# Patient Record
Sex: Female | Born: 1950
Health system: Southern US, Community
[De-identification: ages and names within clinical notes are randomized; demographics above are authoritative.]

## PROBLEM LIST (undated history)

## (undated) DIAGNOSIS — K635 Polyp of colon: Secondary | ICD-10-CM

## (undated) DIAGNOSIS — M1990 Unspecified osteoarthritis, unspecified site: Secondary | ICD-10-CM

## (undated) DIAGNOSIS — T7840XA Allergy, unspecified, initial encounter: Secondary | ICD-10-CM

## (undated) DIAGNOSIS — I10 Essential (primary) hypertension: Secondary | ICD-10-CM

## (undated) HISTORY — DX: Essential (primary) hypertension: I10

## (undated) HISTORY — DX: Unspecified osteoarthritis, unspecified site: M19.90

## (undated) HISTORY — PX: TONSILLECTOMY: SUR1361

## (undated) HISTORY — PX: COLONOSCOPY: SHX174

## (undated) HISTORY — DX: Polyp of colon: K63.5

## (undated) HISTORY — DX: Allergy, unspecified, initial encounter: T78.40XA

---

## 1968-07-19 HISTORY — PX: OTHER SURGICAL HISTORY: SHX169

## 1992-07-19 HISTORY — PX: OTHER SURGICAL HISTORY: SHX169

## 1998-07-17 ENCOUNTER — Other Ambulatory Visit: Admission: RE | Admit: 1998-07-17 | Discharge: 1998-07-17 | Payer: Self-pay | Admitting: Obstetrics and Gynecology

## 1999-02-02 ENCOUNTER — Encounter: Payer: Self-pay | Admitting: Emergency Medicine

## 1999-02-02 ENCOUNTER — Emergency Department (HOSPITAL_COMMUNITY): Admission: EM | Admit: 1999-02-02 | Discharge: 1999-02-02 | Payer: Self-pay | Admitting: Emergency Medicine

## 1999-03-13 ENCOUNTER — Encounter (INDEPENDENT_AMBULATORY_CARE_PROVIDER_SITE_OTHER): Payer: Self-pay | Admitting: Specialist

## 1999-03-13 ENCOUNTER — Other Ambulatory Visit: Admission: RE | Admit: 1999-03-13 | Discharge: 1999-03-13 | Payer: Self-pay | Admitting: Gastroenterology

## 1999-03-26 ENCOUNTER — Other Ambulatory Visit: Admission: RE | Admit: 1999-03-26 | Discharge: 1999-03-26 | Payer: Self-pay | Admitting: Plastic Surgery

## 1999-06-29 ENCOUNTER — Other Ambulatory Visit: Admission: RE | Admit: 1999-06-29 | Discharge: 1999-06-29 | Payer: Self-pay | Admitting: Obstetrics and Gynecology

## 1999-09-15 ENCOUNTER — Encounter: Admission: RE | Admit: 1999-09-15 | Discharge: 1999-09-15 | Payer: Self-pay | Admitting: Internal Medicine

## 1999-09-15 ENCOUNTER — Encounter: Payer: Self-pay | Admitting: Internal Medicine

## 2000-08-09 ENCOUNTER — Other Ambulatory Visit: Admission: RE | Admit: 2000-08-09 | Discharge: 2000-08-09 | Payer: Self-pay | Admitting: Obstetrics and Gynecology

## 2000-10-20 ENCOUNTER — Encounter: Payer: Self-pay | Admitting: Obstetrics and Gynecology

## 2000-10-20 ENCOUNTER — Encounter: Admission: RE | Admit: 2000-10-20 | Discharge: 2000-10-20 | Payer: Self-pay | Admitting: Obstetrics and Gynecology

## 2001-10-13 ENCOUNTER — Encounter: Admission: RE | Admit: 2001-10-13 | Discharge: 2001-10-13 | Payer: Self-pay | Admitting: Internal Medicine

## 2001-10-13 ENCOUNTER — Encounter: Payer: Self-pay | Admitting: Internal Medicine

## 2001-10-24 ENCOUNTER — Encounter: Payer: Self-pay | Admitting: Obstetrics and Gynecology

## 2001-10-24 ENCOUNTER — Encounter: Admission: RE | Admit: 2001-10-24 | Discharge: 2001-10-24 | Payer: Self-pay | Admitting: Obstetrics and Gynecology

## 2001-11-02 ENCOUNTER — Encounter: Admission: RE | Admit: 2001-11-02 | Discharge: 2001-11-02 | Payer: Self-pay | Admitting: Internal Medicine

## 2001-11-02 ENCOUNTER — Encounter: Payer: Self-pay | Admitting: Internal Medicine

## 2002-01-11 ENCOUNTER — Other Ambulatory Visit: Admission: RE | Admit: 2002-01-11 | Discharge: 2002-01-11 | Payer: Self-pay | Admitting: Obstetrics and Gynecology

## 2003-01-18 ENCOUNTER — Other Ambulatory Visit: Admission: RE | Admit: 2003-01-18 | Discharge: 2003-01-18 | Payer: Self-pay | Admitting: Obstetrics and Gynecology

## 2003-01-24 ENCOUNTER — Encounter: Admission: RE | Admit: 2003-01-24 | Discharge: 2003-01-24 | Payer: Self-pay | Admitting: Obstetrics and Gynecology

## 2003-01-24 ENCOUNTER — Encounter: Payer: Self-pay | Admitting: Obstetrics and Gynecology

## 2004-01-30 ENCOUNTER — Encounter: Admission: RE | Admit: 2004-01-30 | Discharge: 2004-01-30 | Payer: Self-pay | Admitting: Internal Medicine

## 2004-02-18 ENCOUNTER — Other Ambulatory Visit: Admission: RE | Admit: 2004-02-18 | Discharge: 2004-02-18 | Payer: Self-pay | Admitting: Obstetrics and Gynecology

## 2004-05-22 ENCOUNTER — Ambulatory Visit: Payer: Self-pay | Admitting: Internal Medicine

## 2005-02-10 ENCOUNTER — Ambulatory Visit: Payer: Self-pay | Admitting: Internal Medicine

## 2005-02-12 ENCOUNTER — Encounter: Admission: RE | Admit: 2005-02-12 | Discharge: 2005-02-12 | Payer: Self-pay | Admitting: Internal Medicine

## 2005-02-19 ENCOUNTER — Other Ambulatory Visit: Admission: RE | Admit: 2005-02-19 | Discharge: 2005-02-19 | Payer: Self-pay | Admitting: Obstetrics and Gynecology

## 2005-03-01 ENCOUNTER — Encounter: Admission: RE | Admit: 2005-03-01 | Discharge: 2005-03-01 | Payer: Self-pay | Admitting: Internal Medicine

## 2005-03-23 ENCOUNTER — Ambulatory Visit: Payer: Self-pay | Admitting: Internal Medicine

## 2005-09-17 ENCOUNTER — Ambulatory Visit: Payer: Self-pay | Admitting: Internal Medicine

## 2006-01-28 ENCOUNTER — Ambulatory Visit: Payer: Self-pay | Admitting: Gastroenterology

## 2006-02-09 ENCOUNTER — Ambulatory Visit: Payer: Self-pay | Admitting: Gastroenterology

## 2006-02-15 ENCOUNTER — Encounter: Admission: RE | Admit: 2006-02-15 | Discharge: 2006-02-15 | Payer: Self-pay | Admitting: Internal Medicine

## 2007-02-17 ENCOUNTER — Encounter: Admission: RE | Admit: 2007-02-17 | Discharge: 2007-02-17 | Payer: Self-pay | Admitting: Internal Medicine

## 2007-06-12 ENCOUNTER — Telehealth: Payer: Self-pay | Admitting: Internal Medicine

## 2007-06-13 ENCOUNTER — Telehealth: Payer: Self-pay | Admitting: Internal Medicine

## 2008-02-19 ENCOUNTER — Encounter: Admission: RE | Admit: 2008-02-19 | Discharge: 2008-02-19 | Payer: Self-pay | Admitting: Internal Medicine

## 2009-02-20 ENCOUNTER — Encounter: Admission: RE | Admit: 2009-02-20 | Discharge: 2009-02-20 | Payer: Self-pay | Admitting: Internal Medicine

## 2009-07-19 LAB — HM PAP SMEAR: HM Pap smear: 2011

## 2010-01-01 ENCOUNTER — Telehealth: Payer: Self-pay | Admitting: Internal Medicine

## 2010-02-23 ENCOUNTER — Encounter: Admission: RE | Admit: 2010-02-23 | Discharge: 2010-02-23 | Payer: Self-pay | Admitting: Internal Medicine

## 2010-03-04 ENCOUNTER — Encounter: Admission: RE | Admit: 2010-03-04 | Discharge: 2010-03-04 | Payer: Self-pay | Admitting: Obstetrics & Gynecology

## 2010-08-18 NOTE — Progress Notes (Signed)
Summary: not seen since 2006  Phone Note Call from Patient Call back at [phone]   Caller: Patient Call For: Jassica Zazueta  Summary of Call: pt has not seen doc Atzin Buchta since 03-23-2005 she is requesting to come in for physical, pt is aware doc Tiye Huwe is not accepting new pt. Can I sch her to have a cpx? Initial call taken by: Heron Sabins,  January 01, 2010 9:02 AM  Follow-up for Phone Call        ok-per dr Yvonne Kendall have cpx Follow-up by: Willy Eddy, LPN,  January 01, 2010 9:23 AM  Additional Follow-up for Phone Call Additional follow up Details #1::        pt will cb Additional Follow-up by: Heron Sabins,  January 01, 2010 11:44 AM

## 2011-01-29 ENCOUNTER — Other Ambulatory Visit: Payer: Self-pay | Admitting: Internal Medicine

## 2011-01-29 DIAGNOSIS — Z1231 Encounter for screening mammogram for malignant neoplasm of breast: Secondary | ICD-10-CM

## 2011-02-25 ENCOUNTER — Ambulatory Visit: Payer: Self-pay | Admitting: Internal Medicine

## 2011-03-02 ENCOUNTER — Ambulatory Visit
Admission: RE | Admit: 2011-03-02 | Discharge: 2011-03-02 | Disposition: A | Discharge status: Home / Self Care | Payer: BC Managed Care – PPO | Source: Ambulatory Visit | Attending: Internal Medicine | Admitting: Internal Medicine

## 2011-03-02 DIAGNOSIS — Z1231 Encounter for screening mammogram for malignant neoplasm of breast: Secondary | ICD-10-CM

## 2011-05-31 ENCOUNTER — Ambulatory Visit: Payer: Self-pay | Admitting: Internal Medicine

## 2011-08-02 ENCOUNTER — Ambulatory Visit: Payer: Self-pay | Admitting: Internal Medicine

## 2011-10-18 ENCOUNTER — Ambulatory Visit: Payer: Self-pay | Admitting: Internal Medicine

## 2012-01-03 ENCOUNTER — Ambulatory Visit: Payer: Self-pay | Admitting: Internal Medicine

## 2012-01-04 ENCOUNTER — Encounter: Payer: Self-pay | Admitting: Internal Medicine

## 2012-01-04 ENCOUNTER — Ambulatory Visit (INDEPENDENT_AMBULATORY_CARE_PROVIDER_SITE_OTHER): Payer: BC Managed Care – PPO | Admitting: Internal Medicine

## 2012-01-04 ENCOUNTER — Other Ambulatory Visit (INDEPENDENT_AMBULATORY_CARE_PROVIDER_SITE_OTHER): Payer: BC Managed Care – PPO | Admitting: *Deleted

## 2012-01-04 VITALS — BP 150/90 | HR 72 | Temp 98.2°F | Resp 16 | Ht 67.0 in | Wt 210.0 lb

## 2012-01-04 DIAGNOSIS — Z0000 Encounter for general adult medical examination without abnormal findings: Secondary | ICD-10-CM

## 2012-01-04 DIAGNOSIS — Z23 Encounter for immunization: Secondary | ICD-10-CM

## 2012-01-04 DIAGNOSIS — R339 Retention of urine, unspecified: Secondary | ICD-10-CM

## 2012-01-04 DIAGNOSIS — R002 Palpitations: Secondary | ICD-10-CM

## 2012-01-04 DIAGNOSIS — I10 Essential (primary) hypertension: Secondary | ICD-10-CM

## 2012-01-04 LAB — TSH: TSH: 2.18 u[IU]/mL (ref 0.35–5.50)

## 2012-01-04 LAB — BASIC METABOLIC PANEL
BUN: 10 mg/dL (ref 6–23)
Creatinine, Ser: 0.8 mg/dL (ref 0.4–1.2)
Glucose, Bld: 91 mg/dL (ref 70–99)
Sodium: 142 mEq/L (ref 135–145)

## 2012-01-04 LAB — POCT URINALYSIS DIPSTICK
Bilirubin, UA: NEGATIVE
Blood, UA: NEGATIVE
Ketones, UA: NEGATIVE
Nitrite, UA: NEGATIVE
Spec Grav, UA: <=1.005

## 2012-01-04 LAB — LIPID PANEL
Cholesterol: 225 mg/dL — ABNORMAL HIGH (ref 0–200)
HDL: 66.7 mg/dL (ref >39.00)
VLDL: 9 mg/dL (ref 0.0–40.0)

## 2012-01-04 LAB — CBC WITH DIFFERENTIAL/PLATELET
Eosinophils Relative: 1.9 % (ref 0.0–5.0)
HCT: 43 % (ref 36.0–46.0)
Lymphs Abs: 2.1 10*3/uL (ref 0.7–4.0)
Monocytes Absolute: 0.5 10*3/uL (ref 0.1–1.0)
Platelets: 261 10*3/uL (ref 150.0–400.0)
RBC: 4.68 Mil/uL (ref 3.87–5.11)
WBC: 6.7 10*3/uL (ref 4.5–10.5)

## 2012-01-04 LAB — HEPATIC FUNCTION PANEL
ALT: 17 U/L (ref 0–35)
AST: 19 U/L (ref 0–37)
Albumin: 4.1 g/dL (ref 3.5–5.2)
Alkaline Phosphatase: 73 U/L (ref 39–117)
Total Bilirubin: 0.7 mg/dL (ref 0.3–1.2)
Total Protein: 7.4 g/dL (ref 6.0–8.3)

## 2012-01-04 LAB — LDL CHOLESTEROL, DIRECT: Direct LDL: 154.1 mg/dL

## 2012-01-04 MED ORDER — TELMISARTAN 40 MG PO TABS: 40.0000 mg | ORAL_TABLET | Freq: Every day | ORAL | Status: DC

## 2012-01-04 NOTE — Patient Instructions (Addendum)
Monitor your blood pressure one supply so we can keep the feedings bring them back with you to your physical examination   "practical paleo"

## 2012-01-04 NOTE — Progress Notes (Signed)
Subjective:    Patient ID: Melinda Ferguson, female    DOB: 1951/06/21, 61 y.o.   MRN: [id number]  HPI  Patient is a 61 year old female presents to reestablish. She has a history of allergic rhinitis and osteo-arthritis.  We reviewed her health maintenance record and have determined that she had colonoscopy in 2007 and will be due a colonoscopy in 2016.  She is up-to-date for other health maintenance with the exception of a zoster vaccine and a tetanus vaccination which we will accomplished today.  The tetanus she should be a DPT to include pertussis. We are working together today to establish some help goals and it is apparent that her blood pressure has been elevated for at least one year and is in a treatment range.    Review of Systems  Constitutional: Negative for activity change, appetite change and fatigue.  HENT: Negative for ear pain, congestion, neck pain, postnasal drip and sinus pressure.   Eyes: Negative for redness and visual disturbance.  Respiratory: Negative for cough, shortness of breath and wheezing.   Gastrointestinal: Negative for abdominal pain and abdominal distention.  Genitourinary: Negative for dysuria, frequency and menstrual problem.  Musculoskeletal: Negative for myalgias, joint swelling and arthralgias.  Skin: Negative for rash and wound.  Neurological: Negative for dizziness, weakness and headaches.  Hematological: Negative for adenopathy. Does not bruise/bleed easily.  Psychiatric/Behavioral: Negative for disturbed wake/sleep cycle and decreased concentration.   Past Medical History  Diagnosis Date  . Allergy   . Arthritis   . Colon polyps     History   Social History  . Marital Status: Married    Spouse Name: N/A    Number of Children: N/A  . Years of Education: N/A   Occupational History  . language facilitator    Social History Main Topics  . Smoking status: Former Smoker    Types: Cigarettes    Quit date: 01/04/1979  . Smokeless  tobacco: Not on file  . Alcohol Use: No  . Drug Use: Not on file  . Sexually Active: Yes   Other Topics Concern  . Not on file   Social History Narrative  . No narrative on file    Past Surgical History  Procedure Date  . Wisdom teeth extraction 1970  . Orthoscopy 1994  . Tonsillectomy     Family History  Problem Relation Age of Onset  . Colon cancer    . Heart disease Mother   . Heart disease Father   . Cancer Maternal Grandfather 26    colon cancer    No Known Allergies  Current Outpatient Prescriptions on File Prior to Visit  Medication Sig Dispense Refill  . fexofenadine (ALLEGRA) 60 MG tablet Take 60 mg by mouth daily.      Marland Kitchen olmesartan (BENICAR) 20 MG tablet Take 20 mg by mouth daily.        BP 150/90  Pulse 72  Temp 98.2 F (36.8 C)  Resp 16  Ht 5\' 7"  (1.702 m)  Wt 210 lb (95.255 kg)  BMI 32.89 kg/m2       Objective:   Physical Exam  Vitals reviewed. Constitutional: She appears well-nourished.  HENT:  Head: Normocephalic and atraumatic.  Eyes: Conjunctivae normal are normal. Pupils are equal, round, and reactive to light.  Cardiovascular: Normal rate and regular rhythm.   Murmur heard. Pulmonary/Chest: No respiratory distress. She has no wheezes.  Abdominal: Soft.  Skin: She is diaphoretic.          Assessment &  Plan:   stage II hypertension.  Her risk factors for hypertension include heart disease in her father, and her father side of the family.  She also has a BMI of 32 which is an additional risk factor for elevated blood pressure.  In the use of nonsteroidal anti-inflammatories. I believe she has true onset essential hypertension.  We will obtain screening lab work today for a physical which will include a basic metabolic panel thyroid panel and a CBC with differential and she will begin Benicar 20 mg by mouth daily which should be tolerated without potential side effects.  When she comes back for her complete physical  examination and for 6 weeks we will evaluate blood pressure control but we will also ask her to monitor her blood pressure at a pharmacy at least once or twice a week in the interim time

## 2012-01-17 ENCOUNTER — Telehealth: Payer: Self-pay | Admitting: Internal Medicine

## 2012-01-17 NOTE — Telephone Encounter (Signed)
Pt called and needs clarification on instructions for Benicar. Pls call.

## 2012-01-17 NOTE — Telephone Encounter (Signed)
Answered questions.  

## 2012-01-27 ENCOUNTER — Other Ambulatory Visit: Payer: Self-pay | Admitting: Internal Medicine

## 2012-01-27 DIAGNOSIS — Z1231 Encounter for screening mammogram for malignant neoplasm of breast: Secondary | ICD-10-CM

## 2012-02-07 ENCOUNTER — Ambulatory Visit (INDEPENDENT_AMBULATORY_CARE_PROVIDER_SITE_OTHER): Payer: BC Managed Care – PPO | Admitting: Internal Medicine

## 2012-02-07 ENCOUNTER — Other Ambulatory Visit: Payer: Self-pay | Admitting: Dermatology

## 2012-02-07 ENCOUNTER — Encounter: Payer: Self-pay | Admitting: Internal Medicine

## 2012-02-07 VITALS — BP 140/80 | HR 68 | Temp 98.2°F | Resp 16 | Ht 67.0 in | Wt 208.0 lb

## 2012-02-07 DIAGNOSIS — Z0000 Encounter for general adult medical examination without abnormal findings: Secondary | ICD-10-CM

## 2012-02-07 DIAGNOSIS — I10 Essential (primary) hypertension: Secondary | ICD-10-CM

## 2012-02-07 NOTE — Patient Instructions (Signed)
   Goals for the next visit is to have 10 pounds off within come back in 2 months for close followup because of how important this is to your overall health You have a goal to get off blood pressure medicines I have a goal to help you.  1 consider a gluten-free diet 2. Take omega-3 supplements fish oil 1000 mg at least twice daily 3. Walking or some form of aerobic exercise 30-40 minutes 3 times a week

## 2012-02-07 NOTE — Progress Notes (Signed)
Subjective:    Patient ID: Melinda Ferguson, female    DOB: [birth year], 61 y.o.   MRN: [id number]  HPI cpx Patient is followed for hypertension well controlled on Benicar she has seasonal allergies for which he takes Allegra and takes ibuprofen on intermittent basis. Laboratory values drawn prior to her physical as well as her vital signs were discussed in detail with the patient today   Review of Systems  Constitutional: Negative for activity change, appetite change and fatigue.  HENT: Negative for ear pain, congestion, neck pain, postnasal drip and sinus pressure.   Eyes: Negative for redness and visual disturbance.  Respiratory: Negative for cough, shortness of breath and wheezing.   Gastrointestinal: Negative for abdominal pain and abdominal distention.  Genitourinary: Negative for dysuria, frequency and menstrual problem.  Musculoskeletal: Negative for myalgias, joint swelling and arthralgias.  Skin: Negative for rash and wound.  Neurological: Negative for dizziness, weakness and headaches.  Hematological: Negative for adenopathy. Does not bruise/bleed easily.  Psychiatric/Behavioral: Negative for disturbed wake/sleep cycle and decreased concentration.    The patient is instructed to continue all medications as prescribed. Schedule followup with check out clerk upon leaving the clinic Past Medical History  Diagnosis Date  . Allergy   . Arthritis   . Colon polyps     History   Social History  . Marital Status: Married    Spouse Name: N/A    Number of Children: N/A  . Years of Education: N/A   Occupational History  . language facilitator    Social History Main Topics  . Smoking status: Former Smoker    Types: Cigarettes    Quit date: 01/04/1979  . Smokeless tobacco: Not on file  . Alcohol Use: No  . Drug Use: Not on file  . Sexually Active: Yes   Other Topics Concern  . Not on file   Social History Narrative  . No narrative on file    Past Surgical  History  Procedure Date  . Wisdom teeth extraction 1970  . Orthoscopy 1994  . Tonsillectomy     Family History  Problem Relation Age of Onset  . Colon cancer    . Heart disease Mother   . Heart disease Father   . Cancer Maternal Grandfather 26    colon cancer    No Known Allergies  Current Outpatient Prescriptions on File Prior to Visit  Medication Sig Dispense Refill  . olmesartan (BENICAR) 20 MG tablet Take 20 mg by mouth daily.      . fexofenadine (ALLEGRA) 60 MG tablet Take 60 mg by mouth daily.      Marland Kitchen ibuprofen (ADVIL,MOTRIN) 200 MG tablet Take 200 mg by mouth every 6 (six) hours as needed.        BP 140/80  Pulse 68  Temp 98.2 F (36.8 C)  Resp 16  Ht 5\' 7"  (1.702 m)  Wt 208 lb (94.348 kg)  BMI 32.58 kg/m2       Objective:   Physical Exam  Vitals reviewed. Constitutional: She is oriented to person, place, and time. She appears well-developed and well-nourished. No distress.  HENT:  Head: Normocephalic and atraumatic.  Right Ear: External ear normal.  Left Ear: External ear normal.  Nose: Nose normal.  Mouth/Throat: Oropharynx is clear and moist.  Eyes: Conjunctivae and EOM are normal. Pupils are equal, round, and reactive to light.  Neck: Normal range of motion. Neck supple. No JVD present. No tracheal deviation present. No thyromegaly present.  Cardiovascular: Normal rate,  regular rhythm, normal heart sounds and intact distal pulses.   No murmur heard. Pulmonary/Chest: Effort normal and breath sounds normal. She has no wheezes. She exhibits no tenderness.  Abdominal: Soft. Bowel sounds are normal.  Musculoskeletal: Normal range of motion. She exhibits no edema and no tenderness.  Lymphadenopathy:    She has no cervical adenopathy.  Neurological: She is alert and oriented to person, place, and time. She has normal reflexes. No cranial nerve deficit.  Skin: Skin is warm and dry. She is not diaphoretic.  Psychiatric: She has a normal mood and affect. Her  behavior is normal.          Assessment & Plan:   Patient presents for yearly preventative medicine examination.   all immunizations and health maintenance protocols were reviewed with the patient and they are up to date with these protocols.   screening laboratory values were reviewed with the patient including screening of hyperlipidemia PSA renal function and hepatic function.   There medications past medical history social history problem list and allergies were reviewed in detail.   Goals were established with regard to weight loss exercise diet in compliance with medications

## 2012-03-02 ENCOUNTER — Ambulatory Visit: Payer: BC Managed Care – PPO

## 2012-03-02 ENCOUNTER — Ambulatory Visit
Admission: RE | Admit: 2012-03-02 | Discharge: 2012-03-02 | Disposition: A | Discharge status: Home / Self Care | Payer: BC Managed Care – PPO | Source: Ambulatory Visit | Attending: Internal Medicine | Admitting: Internal Medicine

## 2012-03-02 DIAGNOSIS — Z1231 Encounter for screening mammogram for malignant neoplasm of breast: Secondary | ICD-10-CM

## 2012-04-07 ENCOUNTER — Telehealth: Payer: Self-pay | Admitting: Internal Medicine

## 2012-04-07 DIAGNOSIS — M25569 Pain in unspecified knee: Secondary | ICD-10-CM

## 2012-04-07 NOTE — Telephone Encounter (Signed)
Patient called stating that she has recurring knee pain and would like to be referred to an ortho MD. Please advise/assist.

## 2012-04-07 NOTE — Telephone Encounter (Signed)
Ok per Dr Jenkins, referral order placed 

## 2012-04-10 ENCOUNTER — Ambulatory Visit: Payer: BC Managed Care – PPO | Admitting: Internal Medicine

## 2012-04-18 ENCOUNTER — Telehealth: Payer: Self-pay | Admitting: Internal Medicine

## 2012-04-18 NOTE — Telephone Encounter (Signed)
Caller: Dorothee/Patient; Patient Name: Melinda Ferguson; PCP: Darryll Capers (Adults only); [facility] Phone Number: [phone].  Patient calling about benicar Rx.  States was to see Dr. Lovell Sheehan in September 2013 and expected to get Rx, but was asked to reschedule appointment for him, and rescheduled appointment in November 2013.  States will run out of benicar this week, and would like samples to get through till appointment in November.  Per Epic, taking benicar 20mg  daily; info to office for staff/provider review/samples of Rx.  May reach patient at [phone].

## 2012-04-19 ENCOUNTER — Other Ambulatory Visit: Payer: Self-pay | Admitting: *Deleted

## 2012-04-19 MED ORDER — OLMESARTAN MEDOXOMIL 20 MG PO TABS: 20.0000 mg | ORAL_TABLET | Freq: Every day | ORAL | Status: DC

## 2012-04-19 NOTE — Telephone Encounter (Signed)
Pt informed none avaiable- reuest call into [facility]

## 2012-05-01 ENCOUNTER — Telehealth: Payer: Self-pay | Admitting: Internal Medicine

## 2012-05-01 MED ORDER — LOSARTAN POTASSIUM 50 MG PO TABS: 50.0000 mg | ORAL_TABLET | Freq: Every day | ORAL | Status: DC

## 2012-05-01 NOTE — Telephone Encounter (Signed)
Caller: Melinda Ferguson/Patient; Patient Name: Melinda Ferguson; PCP: Darryll Capers (Adults only); [facility] Phone Number: [phone] Started on Benicar over the summer(took samples) and had first RX filled one week ago. It is Teir 3 drug and expensive and [employer] did not readily approve it.  She is wondering if she can be put on different medication that is cheaper. She remembers he was going to start her on something different- did not have enough samples for her try. She would like a call back so she can let her [employer] know. Medication Question Protocol.

## 2012-05-01 NOTE — Telephone Encounter (Signed)
Melinda Ferguson, I read the note below. I did the prior auth on her BEnicar, and it was approved 04/24/12. Not sure if there is still an issue, or if it is still too expensive, even with the PA. Thanks.

## 2012-05-01 NOTE — Telephone Encounter (Signed)
Per dr Lovell Sheehan- change to losartan 50 mg and pt informed and med sent in

## 2012-05-22 ENCOUNTER — Ambulatory Visit: Payer: BC Managed Care – PPO | Admitting: Internal Medicine

## 2012-05-23 ENCOUNTER — Ambulatory Visit (INDEPENDENT_AMBULATORY_CARE_PROVIDER_SITE_OTHER): Payer: BC Managed Care – PPO | Admitting: Family

## 2012-05-23 ENCOUNTER — Encounter: Payer: Self-pay | Admitting: Family

## 2012-05-23 VITALS — BP 134/88 | HR 98 | Temp 98.0°F | Wt 205.0 lb

## 2012-05-23 DIAGNOSIS — J309 Allergic rhinitis, unspecified: Secondary | ICD-10-CM

## 2012-05-23 DIAGNOSIS — R0982 Postnasal drip: Secondary | ICD-10-CM

## 2012-05-23 DIAGNOSIS — R05 Cough: Secondary | ICD-10-CM

## 2012-05-23 DIAGNOSIS — R059 Cough, unspecified: Secondary | ICD-10-CM

## 2012-05-23 MED ORDER — FLUTICASONE PROPIONATE 50 MCG/ACT NA SUSP: 2.0000 | Freq: Every day | NASAL | Status: DC

## 2012-05-23 NOTE — Progress Notes (Signed)
Subjective:    Patient ID: Melinda Ferguson, female    DOB: 10/02/1950, 61 y.o.   MRN: [id number]  HPI 61 year old white female, nonsmoker, [name] is in today with complaints of cough x3 weeks, nasal congestion , and sore throat. [name] been taken Allegra 60 mg once daily. Denies any fever, muscle aches or pain. Has a history of allergic rhinitis and typically has the symptoms around the time of the year.   Review of Systems  Constitutional: Negative.   HENT: Positive for congestion, sore throat and postnasal drip. Negative for sinus pressure.   Respiratory: Positive for cough. Negative for shortness of breath and wheezing.   Cardiovascular: Negative.   Gastrointestinal: Negative.   Musculoskeletal: Negative.   Skin: Negative.   Neurological: Negative.   Hematological: Negative.   Psychiatric/Behavioral: Negative.    Past Medical History  Diagnosis Date  . Allergy   . Arthritis   . Colon polyps     History   Social History  . Marital Status: Married    Spouse Name: N/A    Number of Children: N/A  . Years of Education: N/A   Occupational History  . language facilitator    Social History Main Topics  . Smoking status: Former Smoker    Types: Cigarettes    Quit date: 01/04/1979  . Smokeless tobacco: Not on file  . Alcohol Use: No  . Drug Use: Not on file  . Sexually Active: Yes   Other Topics Concern  . Not on file   Social History Narrative  . No narrative on file    Past Surgical History  Procedure Date  . Wisdom teeth extraction 1970  . Orthoscopy 1994  . Tonsillectomy     Family History  Problem Relation Age of Onset  . Colon cancer    . Heart disease Mother   . Heart disease Father   . Cancer Maternal Grandfather 26    colon cancer    No Known Allergies  Current Outpatient Prescriptions on File Prior to Visit  Medication Sig Dispense Refill  . fexofenadine (ALLEGRA) 60 MG tablet Take 60 mg by mouth daily.      Marland Kitchen ibuprofen  (ADVIL,MOTRIN) 200 MG tablet Take 200 mg by mouth every 6 (six) hours as needed.      . fluticasone (FLONASE) 50 MCG/ACT nasal spray Place 2 sprays into the nose daily.  16 g  6  . losartan (COZAAR) 50 MG tablet Take 1 tablet (50 mg total) by mouth daily.  90 tablet  3    BP 134/88  Pulse 98  Temp 98 F (36.7 C) (Oral)  Wt 205 lb (92.987 kg)  SpO2 98%chart    Objective:   Physical Exam  Constitutional: She is oriented to person, place, and time. She appears well-developed and well-nourished.  HENT:  Right Ear: External ear normal.  Left Ear: External ear normal.  Nose: Nose normal.  Mouth/Throat: Oropharynx is clear and moist.  Eyes: Conjunctivae normal are normal. Pupils are equal, round, and reactive to light.  Neck: Normal range of motion. Neck supple.  Cardiovascular: Normal rate, regular rhythm and normal heart sounds.   Pulmonary/Chest: Effort normal and breath sounds normal.  Abdominal: Soft. Bowel sounds are normal.  Musculoskeletal: Normal range of motion.  Neurological: She is alert and oriented to person, place, and time.  Skin: Skin is warm and dry.  Psychiatric: She has a normal mood and affect.  Assessment & Plan:  Assessment: Allergic rhinitis, postnasal drip, cough  Plan: Continue Allegra. Add Flonase 2 sprays in each nostril once daily. Patient to call the office if symptoms worsen or persist. Recheck as scheduled, and when necessary. Consider Medrol Dosepak if her symptoms persist.

## 2012-05-23 NOTE — Patient Instructions (Addendum)
Allergic Rhinitis  Allergic rhinitis is when the mucous membranes in the nose respond to allergens. Allergens are particles in the air that cause your body to have an allergic reaction. This causes you to release allergic antibodies. Through a chain of events, these eventually cause you to release histamine into the blood stream (hence the use of antihistamines). Although meant to be protective to the body, it is this release that causes your discomfort, such as frequent sneezing, congestion and an itchy runny nose.    CAUSES    The pollen allergens may come from grasses, trees, and weeds. This is seasonal allergic rhinitis, or "hay fever." Other allergens cause year-round allergic rhinitis (perennial allergic rhinitis) such as house dust mite allergen, pet dander and mold spores.    SYMPTOMS     Nasal stuffiness (congestion).   Runny, itchy nose with sneezing and tearing of the eyes.   There is often an itching of the mouth, eyes and ears.  It cannot be cured, but it can be controlled with medications.  DIAGNOSIS    If you are unable to determine the offending allergen, skin or blood testing may find it.  TREATMENT     Avoid the allergen.   Medications and allergy shots (immunotherapy) can help.   Hay fever may often be treated with antihistamines in pill or nasal spray forms. Antihistamines block the effects of histamine. There are over-the-counter medicines that may help with nasal congestion and swelling around the eyes. Check with your caregiver before taking or giving this medicine.  If the treatment above does not work, there are many new medications your caregiver can prescribe. Stronger medications may be used if initial measures are ineffective. Desensitizing injections can be used if medications and avoidance fails. Desensitization is when a patient is given ongoing shots until the body becomes less sensitive to the allergen. Make sure you follow up with your caregiver if problems continue.   SEEK MEDICAL CARE IF:     You develop fever (more than 100.5 F (38.1 C).   You develop a cough that does not stop easily (persistent).   You have shortness of breath.   You start wheezing.   Symptoms interfere with normal daily activities.  Document Released: 03/30/2001 Document Revised: 09/27/2011 Document Reviewed: 10/09/2008  [employer] Patient Information 2013 [employer].

## 2012-06-19 ENCOUNTER — Ambulatory Visit (INDEPENDENT_AMBULATORY_CARE_PROVIDER_SITE_OTHER): Payer: BC Managed Care – PPO | Admitting: Family

## 2012-06-19 ENCOUNTER — Encounter: Payer: Self-pay | Admitting: Family

## 2012-06-19 VITALS — BP 142/88 | HR 103 | Temp 98.7°F | Wt 199.0 lb

## 2012-06-19 DIAGNOSIS — R05 Cough: Secondary | ICD-10-CM

## 2012-06-19 DIAGNOSIS — J069 Acute upper respiratory infection, unspecified: Secondary | ICD-10-CM

## 2012-06-19 MED ORDER — METHYLPREDNISOLONE 4 MG PO KIT: PACK | ORAL | Status: AC

## 2012-06-19 NOTE — Patient Instructions (Addendum)

## 2012-06-19 NOTE — Progress Notes (Signed)
  Subjective:    Patient ID: Melinda Ferguson, female    DOB: [birth year], 61 y.o.   MRN: [id number]  HPI 61 year old white female, nonsmoker, [name] is in today with complaints of sneezing, cough, congestion, achiness and fatigue times one day. She has began to take Sudafed and Flonase and feels better today. Denies any nausea, vomiting, or headache.   Review of Systems  Constitutional: Positive for fatigue. Negative for fever.  HENT: Positive for congestion, sneezing and sinus pressure.   Eyes: Negative.   Respiratory: Negative.   Cardiovascular: Negative.   Gastrointestinal: Negative.   Skin: Negative.   Neurological: Negative.   Hematological: Negative.   Psychiatric/Behavioral: Negative.    Past Medical History  Diagnosis Date  . Allergy   . Arthritis   . Colon polyps     History   Social History  . Marital Status: Married    Spouse Name: N/A    Number of Children: N/A  . Years of Education: N/A   Occupational History  . language facilitator    Social History Main Topics  . Smoking status: Former Smoker    Types: Cigarettes    Quit date: 01/04/1979  . Smokeless tobacco: Not on file  . Alcohol Use: No  . Drug Use: Not on file  . Sexually Active: Yes   Other Topics Concern  . Not on file   Social History Narrative  . No narrative on file    Past Surgical History  Procedure Date  . Wisdom teeth extraction 1970  . Orthoscopy 1994  . Tonsillectomy     Family History  Problem Relation Age of Onset  . Colon cancer    . Heart disease Mother   . Heart disease Father   . Cancer Maternal Grandfather 26    colon cancer    No Known Allergies  Current Outpatient Prescriptions on File Prior to Visit  Medication Sig Dispense Refill  . fexofenadine (ALLEGRA) 60 MG tablet Take 60 mg by mouth daily.      . fluticasone (FLONASE) 50 MCG/ACT nasal spray Place 2 sprays into the nose daily.  16 g  6  . losartan (COZAAR) 50 MG tablet Take 1  tablet (50 mg total) by mouth daily.  90 tablet  3  . ibuprofen (ADVIL,MOTRIN) 200 MG tablet Take 200 mg by mouth every 6 (six) hours as needed.        BP 142/88  Pulse 103  Temp 98.7 F (37.1 C) (Oral)  Wt 199 lb (90.266 kg)  SpO2 98%chart    Objective:   Physical Exam  Constitutional: She is oriented to person, place, and time. She appears well-developed and well-nourished.  HENT:  Right Ear: External ear normal.  Left Ear: External ear normal.  Nose: Nose normal.  Mouth/Throat: Oropharynx is clear and moist.  Neck: Neck supple.  Cardiovascular: Normal rate, regular rhythm and normal heart sounds.   Pulmonary/Chest: Effort normal and breath sounds normal.  Neurological: She is alert and oriented to person, place, and time.  Skin: Skin is warm and dry.  Psychiatric: She has a normal mood and affect.          Assessment & Plan:  Assessment: Upper respiratory infection, cough  Plan: Medrol Dosepak as directed. Over-the-counter symptomatic treatment for relief. Patient call the office if symptoms worsen or persist. Recheck a schedule, and when necessary.

## 2012-07-24 ENCOUNTER — Ambulatory Visit: Payer: BC Managed Care – PPO | Admitting: Internal Medicine

## 2012-09-25 ENCOUNTER — Ambulatory Visit: Payer: BC Managed Care – PPO | Admitting: Internal Medicine

## 2012-12-12 ENCOUNTER — Telehealth: Payer: Self-pay | Admitting: Internal Medicine

## 2012-12-12 NOTE — Telephone Encounter (Signed)
Per Oran Rein, recommends that pt take an OTC Zyrtec qd. If not better in 3 days, pt needs to schedule OV

## 2012-12-12 NOTE — Telephone Encounter (Signed)
Pt wanted to leave message for [facility] - she has seen her for runny nose, continuous cough 2x. She has it again. Requesting prednisone rx again, like last time (no fever). Please advise. [facility].

## 2012-12-12 NOTE — Telephone Encounter (Signed)
Advised pt of NP's note. She states that she takes Allegra and flonase qd and that she usually needs a z-pak to get rid of the cough. Appointment offered. Pt has an appointment with PCP 01/01/13 and states that she will try to tough it out a little linger before scheduling an appointment.

## 2012-12-15 ENCOUNTER — Ambulatory Visit (INDEPENDENT_AMBULATORY_CARE_PROVIDER_SITE_OTHER): Payer: BC Managed Care – PPO | Admitting: Family Medicine

## 2012-12-15 ENCOUNTER — Encounter: Payer: Self-pay | Admitting: Family Medicine

## 2012-12-15 VITALS — BP 124/80 | Temp 98.1°F | Wt 207.0 lb

## 2012-12-15 DIAGNOSIS — J329 Chronic sinusitis, unspecified: Secondary | ICD-10-CM

## 2012-12-15 MED ORDER — AMOXICILLIN 875 MG PO TABS: 875.0000 mg | ORAL_TABLET | Freq: Two times a day (BID) | ORAL | Status: DC

## 2012-12-15 MED ORDER — HYDROCODONE-HOMATROPINE 5-1.5 MG/5ML PO SYRP: 5.0000 mL | ORAL_SOLUTION | Freq: Three times a day (TID) | ORAL | Status: DC | PRN

## 2012-12-15 NOTE — Patient Instructions (Addendum)
INSTRUCTIONS FOR UPPER RESPIRATORY INFECTION:  -As we discussed, we have prescribed a new medication for you at this appointment. We discussed the common and serious potential adverse effects of this medication and you can review these and more with the pharmacist when you pick up your medication.  Please follow the instructions for use carefully and notify us immediately if you have any problems taking this medication.  -plenty of rest and fluids  -nasal saline wash 2-3 times daily (use prepackaged nasal saline or bottled/distilled water if making your own)   -can use sinex nasal spray for drainage and nasal congestion - but do NOT use longer then 3-4 days  -can use tylenol or ibuprofen as directed for aches and sorethroat  -in the winter time, using a humidifier at night is helpful (please follow cleaning instructions)  -if you are taking a cough medication - use only as directed, may also try a teaspoon of honey to coat the throat and throat lozenges  -for sore throat, salt water gargles can help  -follow up if you have fevers, facial pain, tooth pain, difficulty breathing or are worsening or not getting better in 5-7 days  

## 2012-12-15 NOTE — Progress Notes (Signed)
Chief Complaint  Patient presents with  . Cough    congestion, allergies, upper respiratory     HPI:  Acute visit for cough and congestion: -started: 2 weeks ago -symptoms:nasal congestion, sore throat, cough, drainage in throat, some sinus pain and pressure -denies:fever, SOB, NVD, tooth pain, sneezing -has tried: delsym, OTC cough medication, flonase, allegra -sick contacts: none known -Hx of: sinusitis   ROS: See pertinent positives and negatives per HPI.  Past Medical History  Diagnosis Date  . Allergy   . Arthritis   . Colon polyps     Family History  Problem Relation Age of Onset  . Colon cancer    . Heart disease Mother   . Heart disease Father   . Cancer Maternal Grandfather 26    colon cancer    History   Social History  . Marital Status: Married    Spouse Name: N/A    Number of Children: N/A  . Years of Education: N/A   Occupational History  . language facilitator    Social History Main Topics  . Smoking status: Former Smoker    Types: Cigarettes    Quit date: 01/04/1979  . Smokeless tobacco: None  . Alcohol Use: No  . Drug Use: None  . Sexually Active: Yes   Other Topics Concern  . None   Social History Narrative  . None    Current outpatient prescriptions:fexofenadine (ALLEGRA) 60 MG tablet, Take 60 mg by mouth daily., Disp: , Rfl: ;  fluticasone (FLONASE) 50 MCG/ACT nasal spray, Place 2 sprays into the nose daily., Disp: 16 g, Rfl: 6;  ibuprofen (ADVIL,MOTRIN) 200 MG tablet, Take 200 mg by mouth every 6 (six) hours as needed., Disp: , Rfl: ;  losartan (COZAAR) 50 MG tablet, Take 1 tablet (50 mg total) by mouth daily., Disp: 90 tablet, Rfl: 3 amoxicillin (AMOXIL) 875 MG tablet, Take 1 tablet (875 mg total) by mouth 2 (two) times daily., Disp: 20 tablet, Rfl: 0;  HYDROcodone-homatropine (HYCODAN) 5-1.5 MG/5ML syrup, Take 5 mLs by mouth every 8 (eight) hours as needed for cough., Disp: 120 mL, Rfl: 0  EXAM:  Filed Vitals:   12/15/12 1421   BP: 124/80  Temp: 98.1 F (36.7 C)    Body mass index is 32.41 kg/(m^2).  GENERAL: vitals reviewed and listed above, alert, oriented, appears well hydrated and in no acute distress  HEENT: atraumatic, conjunttiva clear, no obvious abnormalities on inspection of external nose and ears, normal appearance of ear canals and TMs, white thick nasal congestion R>L, mild post oropharyngeal erythema with PND, no tonsillar edema or exudate, no sinus TTP  NECK: no obvious masses on inspection  LUNGS: clear to auscultation bilaterally, no wheezes, rales or rhonchi, good air movement  CV: HRRR, no peripheral edema  MS: moves all extremities without noticeable abnormality  PSYCH: pleasant and cooperative, no obvious depression or anxiety  ASSESSMENT AND PLAN:  Discussed the following assessment and plan:  Sinusitis - Plan: amoxicillin (AMOXIL) 875 MG tablet, HYDROcodone-homatropine (HYCODAN) 5-1.5 MG/5ML syrup  -Patient advised to return or notify a doctor immediately if symptoms worsen or persist or new concerns arise.  Patient Instructions  INSTRUCTIONS FOR UPPER RESPIRATORY INFECTION:  -As we discussed, we have prescribed a new medication for you at this appointment. We discussed the common and serious potential adverse effects of this medication and you can review these and more with the pharmacist when you pick up your medication.  Please follow the instructions for use carefully and notify us  immediately if you have any problems taking this medication.  -plenty of rest and fluids  -nasal saline wash 2-3 times daily (use prepackaged nasal saline or bottled/distilled water if making your own)   -can use sinex nasal spray for drainage and nasal congestion - but do NOT use longer then 3-4 days  -can use tylenol or ibuprofen as directed for aches and sorethroat  -in the winter time, using a humidifier at night is helpful (please follow cleaning instructions)  -if you are taking a  cough medication - use only as directed, may also try a teaspoon of honey to coat the throat and throat lozenges  -for sore throat, salt water gargles can help  -follow up if you have fevers, facial pain, tooth pain, difficulty breathing or are worsening or not getting better in 5-7 days      Chandel Zaun R.

## 2013-01-01 ENCOUNTER — Encounter: Payer: Self-pay | Admitting: Internal Medicine

## 2013-01-01 ENCOUNTER — Ambulatory Visit (INDEPENDENT_AMBULATORY_CARE_PROVIDER_SITE_OTHER): Payer: BC Managed Care – PPO | Admitting: Internal Medicine

## 2013-01-01 VITALS — BP 130/80 | HR 76 | Temp 98.2°F | Resp 16 | Ht 67.0 in | Wt 205.0 lb

## 2013-01-01 DIAGNOSIS — J309 Allergic rhinitis, unspecified: Secondary | ICD-10-CM

## 2013-01-01 DIAGNOSIS — I10 Essential (primary) hypertension: Secondary | ICD-10-CM

## 2013-01-01 MED ORDER — LORCASERIN HCL 10 MG PO TABS: 1.0000 | ORAL_TABLET | Freq: Two times a day (BID) | ORAL | Status: DC

## 2013-01-01 NOTE — Progress Notes (Signed)
Subjective:    Patient ID: Melinda Ferguson, female    DOB: 26-Mar-1951, 62 y.o.   MRN: [id number]  HPI Recent visit was for sinusitis on 5 /30 treated with flonase and augmentin 875 BID for 10 days Hx of allergic rhinitis HTN stable     Review of Systems  Constitutional: Negative for activity change, appetite change and fatigue.  HENT: Positive for rhinorrhea and postnasal drip. Negative for ear pain, congestion, neck pain and sinus pressure.   Eyes: Negative for redness and visual disturbance.  Respiratory: Negative for cough, shortness of breath and wheezing.   Gastrointestinal: Negative for abdominal pain and abdominal distention.  Genitourinary: Negative for dysuria, frequency and menstrual problem.  Musculoskeletal: Negative for myalgias, joint swelling and arthralgias.  Skin: Negative for rash and wound.  Neurological: Negative for dizziness, weakness and headaches.  Hematological: Negative for adenopathy. Does not bruise/bleed easily.  Psychiatric/Behavioral: Negative for sleep disturbance and decreased concentration.   Past Medical History  Diagnosis Date  . Allergy   . Arthritis   . Colon polyps     History   Social History  . Marital Status: Married    Spouse Name: N/A    Number of Children: N/A  . Years of Education: N/A   Occupational History  . language facilitator    Social History Main Topics  . Smoking status: Former Smoker    Types: Cigarettes    Quit date: 01/04/1979  . Smokeless tobacco: Not on file  . Alcohol Use: No  . Drug Use: Not on file  . Sexually Active: Yes   Other Topics Concern  . Not on file   Social History Narrative  . No narrative on file    Past Surgical History  Procedure Laterality Date  . Wisdom teeth extraction  1970  . Orthoscopy  1994  . Tonsillectomy      Family History  Problem Relation Age of Onset  . Colon cancer    . Heart disease Mother   . Heart disease Father   . Cancer Maternal Grandfather 26    colon cancer    No Known Allergies  Current Outpatient Prescriptions on File Prior to Visit  Medication Sig Dispense Refill  . fexofenadine (ALLEGRA) 60 MG tablet Take 60 mg by mouth daily.      . fluticasone (FLONASE) 50 MCG/ACT nasal spray Place 2 sprays into the nose daily.  16 g  6  . ibuprofen (ADVIL,MOTRIN) 200 MG tablet Take 200 mg by mouth every 6 (six) hours as needed.      Marland Kitchen losartan (COZAAR) 50 MG tablet Take 1 tablet (50 mg total) by mouth daily.  90 tablet  3   No current facility-administered medications on file prior to visit.    BP 130/80  Pulse 76  Temp(Src) 98.2 F (36.8 C)  Resp 16  Ht 5\' 7"  (1.702 m)  Wt 205 lb (92.987 kg)  BMI 32.1 kg/m2       Objective:   Physical Exam  Nursing note and vitals reviewed. Constitutional: She is oriented to person, place, and time. She appears well-developed and well-nourished. No distress.  HENT:  Head: Normocephalic and atraumatic.  Purple turbinates with swelling noted  Eyes: Conjunctivae and EOM are normal. Pupils are equal, round, and reactive to light.  Neck: Normal range of motion. Neck supple. No JVD present. No tracheal deviation present. No thyromegaly present.  Cardiovascular: Normal rate, regular rhythm, normal heart sounds and intact distal pulses.   No murmur heard.  Pulmonary/Chest: Effort normal and breath sounds normal. She has no wheezes. She exhibits no tenderness.  Abdominal: Soft. Bowel sounds are normal.  Musculoskeletal: Normal range of motion. She exhibits no edema and no tenderness.  Lymphadenopathy:    She has no cervical adenopathy.  Neurological: She is alert and oriented to person, place, and time. She has normal reflexes. No cranial nerve deficit.  Skin: Skin is warm and dry. She is not diaphoretic.  Psychiatric: She has a normal mood and affect. Her behavior is normal.          Assessment & Plan:  Weight stable but has not lost weight for goals Paleo  Discussed the diet and  weight lost Belviq 10 mg BID

## 2013-02-05 ENCOUNTER — Encounter: Payer: Self-pay | Admitting: Gastroenterology

## 2013-02-05 ENCOUNTER — Encounter: Payer: Self-pay | Admitting: Internal Medicine

## 2013-02-07 ENCOUNTER — Encounter: Payer: Self-pay | Admitting: Gastroenterology

## 2013-02-12 ENCOUNTER — Other Ambulatory Visit: Payer: Self-pay | Admitting: Dermatology

## 2013-02-15 ENCOUNTER — Other Ambulatory Visit: Payer: Self-pay

## 2013-02-15 DIAGNOSIS — Z1231 Encounter for screening mammogram for malignant neoplasm of breast: Secondary | ICD-10-CM

## 2013-03-02 ENCOUNTER — Ambulatory Visit: Payer: BC Managed Care – PPO | Admitting: Internal Medicine

## 2013-03-06 ENCOUNTER — Ambulatory Visit
Admission: RE | Admit: 2013-03-06 | Discharge: 2013-03-06 | Disposition: A | Discharge status: Home / Self Care | Payer: BC Managed Care – PPO | Source: Ambulatory Visit

## 2013-03-06 DIAGNOSIS — Z1231 Encounter for screening mammogram for malignant neoplasm of breast: Secondary | ICD-10-CM

## 2013-04-27 ENCOUNTER — Telehealth: Payer: Self-pay | Admitting: Internal Medicine

## 2013-04-27 NOTE — Telephone Encounter (Signed)
Pt would like to know if she would be a candidate for the new weight loss drug contrave?   

## 2013-04-27 NOTE — Telephone Encounter (Signed)
Pt would like to know if she would be a candidate for the new weight loss drug contrave?

## 2013-04-29 ENCOUNTER — Other Ambulatory Visit: Payer: Self-pay | Admitting: Internal Medicine

## 2013-04-30 ENCOUNTER — Other Ambulatory Visit: Payer: Self-pay | Admitting: Internal Medicine

## 2013-04-30 NOTE — Telephone Encounter (Signed)
Left message on machine.

## 2013-04-30 NOTE — Telephone Encounter (Signed)
contrave  IS A COMBINATION OF WELLBUTRIN AND NALTREXONE I have not used this combination  You cannot have any alcohol while on this combination And you should read the potential side effect of both drugs before considering this.

## 2013-04-30 NOTE — Telephone Encounter (Signed)
sent 

## 2013-06-22 ENCOUNTER — Telehealth: Payer: Self-pay | Admitting: Internal Medicine

## 2013-06-22 NOTE — Telephone Encounter (Signed)
Talked with pt and she wants to stasrt the contrave. Has talked with insurance co and they will pay partial-please advise

## 2013-06-22 NOTE — Telephone Encounter (Signed)
Pt is interested in trying a new drug that is a diet pill. Pt advised by Dr Lovell Sheehan to look up on line. Contrave    pt would like a call

## 2013-06-25 ENCOUNTER — Other Ambulatory Visit: Payer: Self-pay | Admitting: *Deleted

## 2013-06-25 ENCOUNTER — Other Ambulatory Visit: Payer: Self-pay

## 2013-06-25 MED ORDER — NALTREXONE-BUPROPION HCL ER 8-90 MG PO TB12: 1.0000 | ORAL_TABLET | Freq: Two times a day (BID) | ORAL | Status: DC

## 2013-06-25 MED ORDER — FLUTICASONE PROPIONATE 50 MCG/ACT NA SUSP: 2.0000 | Freq: Every day | NASAL | Status: DC

## 2013-06-25 NOTE — Telephone Encounter (Signed)
For pick up with premed pack

## 2013-06-25 NOTE — Telephone Encounter (Signed)
May give BID

## 2013-06-26 NOTE — Telephone Encounter (Signed)
Left message on machine She received the only one we had- will ask drug detail person about it when they come in

## 2013-06-26 NOTE — Telephone Encounter (Signed)
Pt states there was supposed to be a coupon for a free rx mo and it was not in her rx envelope. pls advise.

## 2013-07-23 ENCOUNTER — Telehealth: Payer: Self-pay | Admitting: Internal Medicine

## 2013-07-23 ENCOUNTER — Other Ambulatory Visit: Payer: Self-pay | Admitting: *Deleted

## 2013-07-23 NOTE — Telephone Encounter (Signed)
1 will be up front for you.

## 2013-07-23 NOTE — Telephone Encounter (Signed)
1 coupon out front for patient

## 2013-07-23 NOTE — Telephone Encounter (Signed)
Pt was rx Naltrexone-Bupropion HCl ER (CONTRAVE) 8-90 MG TB12 She was supposed to get a voucher for a free month. Pt instructed to call back later after we spoke w/ drug rep to get some more of the free mo vouchers

## 2014-01-03 ENCOUNTER — Telehealth: Payer: Self-pay | Admitting: Internal Medicine

## 2014-01-03 NOTE — Telephone Encounter (Signed)
Needs new patient visit in available new patient slot. Advise her of Dr. Ocie Cornfield whom is taking over for Dr. Arnoldo Morale also as an option.

## 2014-01-03 NOTE — Telephone Encounter (Signed)
Pt would like to switch to dr kim. Can I sch? °

## 2014-01-07 ENCOUNTER — Other Ambulatory Visit: Payer: BC Managed Care – PPO

## 2014-01-08 NOTE — Telephone Encounter (Signed)
Pt has been sch

## 2014-01-14 ENCOUNTER — Encounter: Payer: BC Managed Care – PPO | Admitting: Internal Medicine

## 2014-01-28 ENCOUNTER — Telehealth: Payer: Self-pay | Admitting: Internal Medicine

## 2014-01-28 NOTE — Telephone Encounter (Signed)
I recommend [facility]  - if needs hearing aide they tend to be cheaper here then elsewhere. Does not need referral.

## 2014-01-28 NOTE — Telephone Encounter (Signed)
Patient informed. 

## 2014-01-28 NOTE — Telephone Encounter (Signed)
Pt wants a referral to have her hearing checked.  She states her family is telling her that she isn't hearing very well.  Pt is scheduled to est with Dr. Maudie Mercury on 02/19/14.

## 2014-01-30 ENCOUNTER — Telehealth: Payer: Self-pay | Admitting: Nurse Practitioner

## 2014-01-30 ENCOUNTER — Ambulatory Visit: Payer: Self-pay | Admitting: Nurse Practitioner

## 2014-01-30 NOTE — Telephone Encounter (Signed)
Patient canceled her appointment today due to a back injury moving furniture yesterday. Patient rescheduled with [facility].

## 2014-02-05 ENCOUNTER — Other Ambulatory Visit: Payer: Self-pay

## 2014-02-05 DIAGNOSIS — Z1231 Encounter for screening mammogram for malignant neoplasm of breast: Secondary | ICD-10-CM

## 2014-02-19 ENCOUNTER — Encounter: Payer: Self-pay | Admitting: Family Medicine

## 2014-02-19 ENCOUNTER — Ambulatory Visit (INDEPENDENT_AMBULATORY_CARE_PROVIDER_SITE_OTHER): Payer: BC Managed Care – PPO | Admitting: Family Medicine

## 2014-02-19 ENCOUNTER — Telehealth: Payer: Self-pay | Admitting: Family Medicine

## 2014-02-19 VITALS — BP 122/86 | HR 70 | Temp 98.1°F | Ht 63.75 in | Wt 202.0 lb

## 2014-02-19 DIAGNOSIS — M1711 Unilateral primary osteoarthritis, right knee: Secondary | ICD-10-CM | POA: Insufficient documentation

## 2014-02-19 DIAGNOSIS — E669 Obesity, unspecified: Secondary | ICD-10-CM

## 2014-02-19 DIAGNOSIS — I10 Essential (primary) hypertension: Secondary | ICD-10-CM

## 2014-02-19 DIAGNOSIS — M1710 Unilateral primary osteoarthritis, unspecified knee: Secondary | ICD-10-CM

## 2014-02-19 MED ORDER — LOSARTAN POTASSIUM 50 MG PO TABS: 50.0000 mg | ORAL_TABLET | Freq: Every day | ORAL | Status: DC

## 2014-02-19 NOTE — Progress Notes (Signed)
No chief complaint on file.   HPI:  Melinda Ferguson is here to establish care.  Last PCP and physical: sees Jackey Loge; get physical yearly  Has the following chronic problems and concerns today:  Patient Active Problem List   Diagnosis Date Noted  . Osteoarthritis of right knee - sees Dr. Wynelle Link 02/19/2014  . HTN (hypertension) 01/01/2013  . Allergic rhinitis 01/01/2013   HTN: -stable -on losartan 50 mg daily -needs refill  Obesity: -scared to take weight loss medications after reading side effects -wants to loose weight though  Knee pain: -sees dr. Gemma Payor -wants to know what to take   ROS negative for unless reported above: fevers, unintentional weight loss, hearing or vision loss, chest pain, palpitations, struggling to breath, hemoptysis, melena, hematochezia, hematuria, falls, loc, si, thoughts of self harm  Past Medical History  Diagnosis Date  . Allergy   . Arthritis   . Colon polyps   . Hypertension     Family History  Problem Relation Age of Onset  . Colon cancer    . Heart disease Mother   . Heart disease Father   . Cancer Maternal Grandfather 26    colon cancer    History   Social History  . Marital Status: Married    Spouse Name: N/A    Number of Children: N/A  . Years of Education: N/A   Occupational History  . language facilitator    Social History Main Topics  . Smoking status: Former Smoker    Types: Cigarettes    Quit date: 01/04/1979  . Smokeless tobacco: None  . Alcohol Use: No  . Drug Use: None  . Sexual Activity: Yes   Other Topics Concern  . 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  GENERAL: vitals reviewed and listed above, alert, oriented, appears well hydrated and in no acute distress  HEENT: atraumatic, conjunttiva clear, no obvious abnormalities on inspection of external nose and ears  NECK: no obvious masses on inspection  LUNGS: clear to auscultation bilaterally, no wheezes, rales or rhonchi, good air movement  CV: HRRR, no peripheral edema  MS: moves all extremities without noticeable abnormality  PSYCH: pleasant and cooperative, no obvious depression or anxiety  ASSESSMENT AND PLAN:  Discussed the following assessment and plan:  Primary osteoarthritis of right knee  Essential hypertension - Plan: losartan (COZAAR) 50 MG tablet  Obesity, unspecified  -We reviewed the PMH, PSH, FH, SH, Meds and Allergies. -We provided refills for any medications we will prescribe as needed. -We addressed current concerns per orders and patient instructions. -We have asked for records for pertinent exams, studies, vaccines and notes from previous providers. -We have  advised patient to follow up per instructions below. -health coach referral/rx given -follow up for CPE   -Patient advised to return or notify a doctor immediately if symptoms worsen or persist or new concerns arise.  Patient Instructions  For the knee pain: -can use tylenol 500-1000mg  up to 3 times daily maximum -or bad days can use the naproxen per instructions -daily exercise and follow up with your doctor  Treatment Options for Obesity:  A comprehensive exercise and diet program are advised and are the initial and longterm/lifelong treatment for  obesity.  This is a long and initially often difficult process and it will often take years to get to reach optimal health  As you get healthier you will build extra blood volume and lean tissue that is heavier then fatty tissue and you will hit plateaus in your weight - thus I ADVISE YOU DO NOT FOLLOW YOUR WEIGHT ON A SCALE   The safest and healthiest way to improve you physical and mental health is to: - eat a healthy diet consisting of lots of vegetables, fruits, beans, nuts, seeds, healthy meats such as white chicken and fish and whole grains.  - avoid fried foods, fast food, processed foods, sodas, red meet and other fattening foods.  - get a least 150-300 minutes of aerobic exercise per week.  -reduce stress - counseling, meditation, relaxation to balance other aspects of your life   Medication for Obesity: IF BMI >30 and you have not achieved weight loss through diet and exercise alone, we suggest pharmacologic therapy can be added to diet and exercise.  First Choice:  1)Orlistat (Xenical): -likely the safest in terms of heart and vascular side effects and helps cholesterol - usually recommended for 2 - 4 years -common and or serious side effects include but are not limited to: anaphylaxis, liver toxicity, gas, oily stools, loose stools, fatty stools -150 mg up to 3 times daily only during meals containing fat, diet should be <30% fat -not recommended in: pregnancy, malabsorption, some kidney stones, anorexia or bulemia history  2)Lorcaserin (Belviq) -no long term safety data -to be discontinued if do not achieve 5% body weight reduction in 12 weeks -common and or serious side effects include but are not limited to: heart disease, anemia, abuse and dependency, depression, suicide, nausea and vomiting, headaches, low blood sugar, dizziness, anemia, fatigue, diarrhea, urinary tract infections, back pain, dry mouth, pain, cognitive impairment 10mg  twice daily -not recommended if  pregnant, kidney disease, heart disease, diabetes, depression -I do not advise long term and advise weaning of if not effective and after 1-2 years  3)combination phentermine-topiramate (Qsymia)- for men or post menopausal women or monthly pregnancy test: -can cause fetal anomalies -to be discontinued GRADUALLY if do not achieve 5% body weight reduction in 12 weeks -adverse: -not recommended if: heart disease, breastfeeding, pregnant, hyperthyroid, glaucoma, drug abuse history, renal or liver problems, alcohol use, depression -common and or serious side effects include but are not limited to: metabolic acidosis, kidney stones, bone issues, electrolyte issues, heart attack, suicide, psychosis, dependency and abuse, severe reaction, seizure is stop suddenly, dry mouth, numbness, headache, blurred vision, diarrhea, fatigue, depression, anxiety, pain, poor focus, acid reflux, dry eyes, eye pain, heart arhythmia -I do not advise long term and advise weaning of if not effective and in 1-2 years  If diabetes:  1) Metformin, orlistat or  2)Liraglutide (Victoza) -can cause t cell tumors, anaphylaxis, kidney toxicity, pancreatitis, nausea, vomiting, diarrhea, headache -injected:0.6mg  daily for 1 week, then 1.2mg   daily  Surgery is also an option and [place] has many useful resources if you are considering this.  Please check out this website if you wish:  FactoringRate.[place]  FOR IMPROVED SLEEP AND TO RESET YOUR SLEEP SCHEDULE: []  exercise 30 minutes daily  []  go to bed and wake up at the same time  []  keep bedroom cool, dark and quiet  []  reserve bed for sleep - do not read, watch TV, etc in bed  []  If you toss and turn more then 15-20 minutes get out of bed and list thoughts/do quite activity then go back to bed; repeat as needed; do not worry about when you eventually fall asleep - still get up at the same time and turn on lights and take shower  [] get  counseling  []  some people find that a half dose of benadryl, melatonin, tylenol pm or unisom on a few nights per week is helpful initially for a few weeks  [] seek help and treat any depression or anxiety  [] prescription strength sleep medications should only be used in severe cases of insomnia if other measures fail and should be used sparingly      Trisha Morandi, Jarrett Soho R.

## 2014-02-19 NOTE — Telephone Encounter (Signed)
Pt called to ask if it was ok to take fish oil

## 2014-02-19 NOTE — Patient Instructions (Signed)
For the knee pain: -can use tylenol 500-1000mg  up to 3 times daily maximum -or bad days can use the naproxen per instructions -daily exercise and follow up with your doctor  Treatment Options for Obesity:  A comprehensive exercise and diet program are advised and are the initial and longterm/lifelong treatment for obesity.  This is a long and initially often difficult process and it will often take years to get to reach optimal health  As you get healthier you will build extra blood volume and lean tissue that is heavier then fatty tissue and you will hit plateaus in your weight - thus I ADVISE YOU DO NOT FOLLOW YOUR WEIGHT ON A SCALE   The safest and healthiest way to improve you physical and mental health is to: - eat a healthy diet consisting of lots of vegetables, fruits, beans, nuts, seeds, healthy meats such as white chicken and fish and whole grains.  - avoid fried foods, fast food, processed foods, sodas, red meet and other fattening foods.  - get a least 150-300 minutes of aerobic exercise per week.  -reduce stress - counseling, meditation, relaxation to balance other aspects of your life   Medication for Obesity: IF BMI >30 and you have not achieved weight loss through diet and exercise alone, we suggest pharmacologic therapy can be added to diet and exercise.  First Choice:  1)Orlistat (Xenical): -likely the safest in terms of heart and vascular side effects and helps cholesterol - usually recommended for 2 - 4 years -common and or serious side effects include but are not limited to: anaphylaxis, liver toxicity, gas, oily stools, loose stools, fatty stools -150 mg up to 3 times daily only during meals containing fat, diet should be <30% fat -not recommended in: pregnancy, malabsorption, some kidney stones, anorexia or bulemia history  2)Lorcaserin (Belviq) -no long term safety data -to be discontinued if do not achieve 5% body weight reduction in 12 weeks -common and or  serious side effects include but are not limited to: heart disease, anemia, abuse and dependency, depression, suicide, nausea and vomiting, headaches, low blood sugar, dizziness, anemia, fatigue, diarrhea, urinary tract infections, back pain, dry mouth, pain, cognitive impairment 10mg  twice daily -not recommended if pregnant, kidney disease, heart disease, diabetes, depression -I do not advise long term and advise weaning of if not effective and after 1-2 years  3)combination phentermine-topiramate (Qsymia)- for men or post menopausal women or monthly pregnancy test: -can cause fetal anomalies -to be discontinued GRADUALLY if do not achieve 5% body weight reduction in 12 weeks -adverse: -not recommended if: heart disease, breastfeeding, pregnant, hyperthyroid, glaucoma, drug abuse history, renal or liver problems, alcohol use, depression -common and or serious side effects include but are not limited to: metabolic acidosis, kidney stones, bone issues, electrolyte issues, heart attack, suicide, psychosis, dependency and abuse, severe reaction, seizure is stop suddenly, dry mouth, numbness, headache, blurred vision, diarrhea, fatigue, depression, anxiety, pain, poor focus, acid reflux, dry eyes, eye pain, heart arhythmia -I do not advise long term and advise weaning of if not effective and in 1-2 years  If diabetes:  1) Metformin, orlistat or  2)Liraglutide (Victoza) -can cause t cell tumors, anaphylaxis, kidney toxicity, pancreatitis, nausea, vomiting, diarrhea, headache -injected:0.6mg  daily for 1 week, then 1.2mg  daily  Surgery is also an option and [facility] has many useful resources if you are considering this.  Please check out this website if you wish:  FactoringRate.[place]  FOR IMPROVED SLEEP AND TO RESET YOUR SLEEP SCHEDULE: []   exercise 30 minutes daily  []  go to bed and wake up at the same time  []  keep bedroom cool, dark and quiet  []  reserve bed  for sleep - do not read, watch TV, etc in bed  []  If you toss and turn more then 15-20 minutes get out of bed and list thoughts/do quite activity then go back to bed; repeat as needed; do not worry about when you eventually fall asleep - still get up at the same time and turn on lights and take shower  [] get counseling  []  some people find that a half dose of benadryl, melatonin, tylenol pm or unisom on a few nights per week is helpful initially for a few weeks  [] seek help and treat any depression or anxiety  [] prescription strength sleep medications should only be used in severe cases of insomnia if other measures fail and should be used sparingly

## 2014-02-19 NOTE — Progress Notes (Signed)
Pre visit review using our clinic review tool, if applicable. No additional management support is needed unless otherwise documented below in the visit note. 

## 2014-02-20 NOTE — Telephone Encounter (Signed)
done

## 2014-02-20 NOTE — Telephone Encounter (Signed)
I don't see a reason on her PMH or problem list to need this. It would be best to eat fish as part of a healthy diet.

## 2014-02-20 NOTE — Telephone Encounter (Signed)
Patient informed. 

## 2014-02-26 ENCOUNTER — Other Ambulatory Visit: Payer: Self-pay | Admitting: Dermatology

## 2014-02-27 ENCOUNTER — Telehealth: Payer: Self-pay | Admitting: Family Medicine

## 2014-02-27 NOTE — Telephone Encounter (Signed)
Pt has lost her card for the [facility] / 3 mo discounted membership for weight reduce program. Dr Maudie Mercury filled out at pt's visit. Pt would like to know if you will fill out another for her?

## 2014-02-28 ENCOUNTER — Telehealth: Payer: Self-pay | Admitting: Family Medicine

## 2014-02-28 NOTE — Telephone Encounter (Signed)
Patient Information:  Caller Name: Melinda Ferguson  Phone: [phone]  Patient: Melinda Ferguson, Melinda Ferguson  Gender: Female  DOB: 1950-12-22  Age: 63 Years  PCP: Maudie Mercury (TEXT 1st, after 20 mins can call), [facility] [place])  Office Follow Up:  Does the office need to follow up with this patient?: No  Instructions For The Office: N/A  RN Note:  Patient request evaluation of the area. Concerned. appt scheduled with Kela Millin PA-C 03/01/14 at 10:00.  Care advice and call back parameter reviewed. Understanding expressed.  Symptoms  Reason For Call & Symptoms: Patient report removing Tick yesterday 02/27/14.  Attached upper Left thigh/groin .  Described small tiny Carbine tick.  She removed all of the tick.  She reports today , small  Bite area red bump, red around area.  No fever.  Reviewed Health History In EMR: Yes  Reviewed Medications In EMR: Yes  Reviewed Allergies In EMR: Yes  Reviewed Surgeries / Procedures: Yes  Date of Onset of Symptoms: 02/27/2014  Treatments Tried: hydrogen peroxide, alcohol, bleach  Treatments Tried Worked: No  Guideline(s) Used:  Tick Bite  Disposition Per Guideline:   Home Care  Reason For Disposition Reached:   Tick bite with no complications  Advice Given:  Antibiotic Ointment:  Wash the wound and your hands with soap and water after removal to prevent catching any tick disease. Apply an over-the-counter antibiotic ointment (e.g., bacitracin) to the bite once.  Expected Course:  Tick bites normally do not itch or hurt. That is why they often go unnoticed.  Call Back If:  Fever or rash occur in the next 2 weeks  Bite begins to look infected  You become worse.  Prevention - General:  Inspect your entire body and your clothing every couple hours. Favorite places are in the hair, so be certain to check your scalp, neck, armpits, and groin.  A shower at the end of a hike will help rinse off any tick that is not firmly attached.  Prevention with Insect  Repellent - DEET:  DEET is a very effective tick repellent. It also repels mosquitoes and other bugs.  Apply to exposed areas of skin. Do not apply to eyes, mouth or irritated areas of skin. Remember to wash it off with soap and water when you return indoors.  RN Overrode Recommendation:  Make Appointment  Patient requested appt  Appointment Scheduled:  03/01/2014 10:00:00 Appointment Scheduled Provider:  Kela Millin (only sees ages 63 and up)

## 2014-02-28 NOTE — Telephone Encounter (Signed)
The form was completed and faxed to [facility] at [phone] and I called the pt and left a detailed message stating this and that Jackelyn Poling will call her for the initial consultation appt.

## 2014-02-28 NOTE — Telephone Encounter (Signed)
Noted  

## 2014-03-01 ENCOUNTER — Encounter: Payer: Self-pay | Admitting: Physician Assistant

## 2014-03-01 ENCOUNTER — Ambulatory Visit (INDEPENDENT_AMBULATORY_CARE_PROVIDER_SITE_OTHER): Payer: BC Managed Care – PPO | Admitting: Physician Assistant

## 2014-03-01 VITALS — BP 122/86 | HR 72 | Temp 98.6°F | Resp 18 | Wt 202.0 lb

## 2014-03-01 DIAGNOSIS — T148 Other injury of unspecified body region: Secondary | ICD-10-CM

## 2014-03-01 DIAGNOSIS — W57XXXA Bitten or stung by nonvenomous insect and other nonvenomous arthropods, initial encounter: Secondary | ICD-10-CM

## 2014-03-01 MED ORDER — TRIAMCINOLONE ACETONIDE 0.1 % EX CREA: 1.0000 "application " | TOPICAL_CREAM | Freq: Two times a day (BID) | CUTANEOUS | Status: DC

## 2014-03-01 NOTE — Progress Notes (Signed)
Subjective:    Patient ID: Melinda Ferguson, female    DOB: Feb 15, 1951, 63 y.o.   MRN: [id number]  HPI Patient is a 63 y.o. female presenting for tick bite. Pt states that she noticed a tick in her groin area on Wednesday and removed it. She doesn't believe it could have been there for more than 24 hours as she had a full body exam by dermatology the day before. She has a dog, who sleeps in her bed sometimes, and she believes this is the most likely source. She has noticed some redness and itching in the area. She denies any "bulls-eye" rash. Patient denies fevers, chills, nausea, vomiting, diarrhea, shortness of breath, chest pain, headache, syncope..    Review of Systems As per HPI and are otherwise negative.   Past Medical History  Diagnosis Date  . Allergy   . Arthritis   . Colon polyps   . Hypertension     History   Social History  . Marital Status: Married    Spouse Name: N/A    Number of Children: N/A  . Years of Education: N/A   Occupational History  . language facilitator    Social History Main Topics  . Smoking status: Former Smoker    Types: Cigarettes    Quit date: 01/04/1979  . Smokeless tobacco: Not on file  . Alcohol Use: No  . Drug Use: Not on file  . Sexual Activity: Yes   Other Topics Concern  . 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 Wisdom teeth extraction  1970  . Orthoscopy  1994  . Tonsillectomy      Family History  Problem Relation Age of Onset  . Colon cancer    . Heart disease Mother   . Heart disease Father   . Cancer Maternal Grandfather 26    colon cancer    No Known Allergies  Current Outpatient Prescriptions on File Prior to Visit  Medication Sig  Dispense Refill  . fexofenadine (ALLEGRA) 60 MG tablet Take 60 mg by mouth daily.      . fluticasone (FLONASE) 50 MCG/ACT nasal spray Place 2 sprays into both nostrils daily.  16 g  6  . losartan (COZAAR) 50 MG tablet Take 1 tablet (50 mg total) by mouth daily.  90 tablet  3   No current facility-administered medications on file prior to visit.    EXAM: BP 122/86  Pulse 72  Temp(Src) 98.6 F (37 C) (Oral)  Resp 18  Wt 202 lb (91.627 kg)     Objective:   Physical Exam  Nursing note and vitals reviewed. Constitutional: She is oriented to person, place, and time. She appears well-developed and well-nourished. No distress.  HENT:  Head: Normocephalic and atraumatic.  Eyes: Conjunctivae and EOM are normal. Pupils are equal, round, and reactive to light.  Cardiovascular: Normal rate, regular rhythm and intact distal pulses.   Pulmonary/Chest: Effort normal and breath sounds normal. No respiratory distress. She exhibits no tenderness.  Neurological: She is alert and oriented to person, place, and time.  Skin: Skin is warm and dry. No rash noted.  She is not diaphoretic. There is erythema. No pallor.  Local inflammation sub-centimeter surrounding a central puncture of the left medial upper thigh. No surrounding erythema, no swelling, no warmth, no ttp, no fluctuance. No visible retained foreign body.  Psychiatric: She has a normal mood and affect. Her behavior is normal. Judgment and thought content normal.      Lab Results  Component Value Date   WBC 6.7 01/04/2012   HGB 14.5 01/04/2012   HCT 43.0 01/04/2012   PLT 261.0 01/04/2012   GLUCOSE 91 01/04/2012   CHOL 225* 01/04/2012   TRIG 45.0 01/04/2012   HDL 66.70 01/04/2012   LDLDIRECT 154.1 01/04/2012   ALT 17 01/04/2012   AST 19 01/04/2012   NA 142 01/04/2012   K 4.9 01/04/2012   CL 107 01/04/2012   CREATININE 0.8 01/04/2012   BUN 10 01/04/2012   CO2 25 01/04/2012   TSH 2.18 01/04/2012       Assessment & Plan:  Melinda Ferguson was seen  today for tick removal.  Diagnoses and associated orders for this visit:  Tick bite Comments: no retained foreign body, local reaction. Topical triamcinolone cream for itching, Watchful waiting. - triamcinolone cream (KENALOG) 0.1 %; Apply 1 application topically 2 (two) times daily.    Return precautions provided, and patient handout on tick bite.  Plan to follow up as needed, or for worsening or persistent symptoms despite treatment.  Patient Instructions  Triamcinolone cream apply topically 2-3 times per day for itching symptoms.  You can also use cool compresses or cold running water to help itching symptoms.  Continue to monitor the area, and please return to clinic for any increase in redness, swelling, or if tenderness to palpation develops, or if you develop a fever.  If emergency symptoms discussed during visit developed, seek medical attention immediately.  Followup as needed, or for worsening or persistent symptoms despite treatment.

## 2014-03-01 NOTE — Patient Instructions (Addendum)
Triamcinolone cream apply topically 2-3 times per day for itching symptoms.  You can also use cool compresses or cold running water to help itching symptoms.  Continue to monitor the area, and please return to clinic for any increase in redness, swelling, or if tenderness to palpation develops, or if you develop a fever.  If emergency symptoms discussed during visit developed, seek medical attention immediately.  Followup as needed, or for worsening or persistent symptoms despite treatment.    Tick Bite Information Ticks are insects that attach themselves to the skin. There are many types of ticks. Common types include wood ticks and deer ticks. Sometimes, ticks carry diseases that can make a person very ill. The most common places for ticks to attach themselves are the scalp, neck, armpits, waist, and groin.  HOW CAN YOU PREVENT TICK BITES? Take these steps to help prevent tick bites when you are outdoors:  Wear long sleeves and long pants.  Wear white clothes so you can see ticks more easily.  Tuck your pant legs into your socks.  If walking on a trail, stay in the middle of the trail to avoid brushing against bushes.  Avoid walking through areas with long grass.  Put bug spray on all skin that is showing and along boot tops, pant legs, and sleeve cuffs.  Check clothes, hair, and skin often and before going inside.  Brush off any ticks that are not attached.  Take a shower or bath as soon as possible after being outdoors. HOW SHOULD YOU REMOVE A TICK? Ticks should be removed as soon as possible to help prevent diseases. 1. If latex gloves are available, put them on before trying to remove a tick. 2. Use tweezers to grasp the tick as close to the skin as possible. You may also use curved forceps or a tick removal tool. Grasp the tick as close to its head as possible. Avoid grasping the tick on its body. 3. Pull gently upward until the tick lets go. Do not twist the tick or jerk  it suddenly. This may break off the tick's head or mouth parts. 4. Do not squeeze or crush the tick's body. This could force disease-carrying fluids from the tick into your body. 5. After the tick is removed, wash the bite area and your hands with soap and water or alcohol. 6. Apply a small amount of antiseptic cream or ointment to the bite site. 7. Wash any tools that were used. Do not try to remove a tick by applying a hot match, petroleum jelly, or fingernail polish to the tick. These methods do not work. They may also increase the chances of disease being spread from the tick bite. WHEN SHOULD YOU SEEK HELP? Contact your health care provider if you are unable to remove a tick or if a part of the tick breaks off in the skin. After a tick bite, you need to watch for signs and symptoms of diseases that can be spread by ticks. Contact your health care provider if you develop any of the following:  Fever.  Rash.  Redness and puffiness (swelling) in the area of the tick bite.  Tender, puffy lymph glands.  Watery poop (diarrhea).  Weight loss.  Cough.  Feeling more tired than normal (fatigue).  Muscle, joint, or bone pain.  Belly (abdominal) pain.  Headache.  Change in your level of consciousness.  Trouble walking or moving your legs.  Loss of feeling (numbness) in the legs.  Loss of movement (paralysis).  Shortness of breath.  Confusion.  Throwing up (vomiting) many times. Document Released: 09/29/2009 Document Revised: 03/07/2013 Document Reviewed: 12/13/2012 [facility] Patient Information 2015 [place], [place]. This information is not intended to replace advice given to you by your health care provider. Make sure you discuss any questions you have with your health care provider.

## 2014-03-07 ENCOUNTER — Ambulatory Visit: Payer: BC Managed Care – PPO

## 2014-03-08 ENCOUNTER — Ambulatory Visit
Admission: RE | Admit: 2014-03-08 | Discharge: 2014-03-08 | Disposition: A | Discharge status: Home / Self Care | Payer: BC Managed Care – PPO | Source: Ambulatory Visit

## 2014-03-08 DIAGNOSIS — Z1231 Encounter for screening mammogram for malignant neoplasm of breast: Secondary | ICD-10-CM

## 2014-03-28 ENCOUNTER — Encounter: Payer: Self-pay | Admitting: Nurse Practitioner

## 2014-03-28 ENCOUNTER — Ambulatory Visit (INDEPENDENT_AMBULATORY_CARE_PROVIDER_SITE_OTHER): Payer: BC Managed Care – PPO | Admitting: Nurse Practitioner

## 2014-03-28 VITALS — BP 122/76 | HR 84 | Ht 64.0 in | Wt 198.0 lb

## 2014-03-28 DIAGNOSIS — M1990 Unspecified osteoarthritis, unspecified site: Secondary | ICD-10-CM

## 2014-03-28 DIAGNOSIS — I10 Essential (primary) hypertension: Secondary | ICD-10-CM

## 2014-03-28 DIAGNOSIS — Z01419 Encounter for gynecological examination (general) (routine) without abnormal findings: Secondary | ICD-10-CM

## 2014-03-28 DIAGNOSIS — Z1211 Encounter for screening for malignant neoplasm of colon: Secondary | ICD-10-CM

## 2014-03-28 DIAGNOSIS — E2839 Other primary ovarian failure: Secondary | ICD-10-CM

## 2014-03-28 NOTE — Patient Instructions (Signed)

## 2014-03-28 NOTE — Progress Notes (Signed)
Patient ID: Melinda Ferguson, female   DOB: [birth year], 63 y.o.   MRN: [id number] 63 y.o. [id number] Married Caucasian Fe here for annual exam.  No new health problems.  Patient's last menstrual period was 07/19/1997.          Sexually active: No.  The current method of family planning is post menopausal status.    Exercising: Yes.    Gym/ health club routine includes walking and using machines at [facility]. Smoker:  no  Health Maintenance: Pap:  01/24/12, WNL, neg HR HPV MMG:  03/08/14, Bi-Rads 1:  Negative  Colonoscopy:  2007, normal, repeat in 10 years BMD:   03/04/10, -0.7S/-1.0L TDaP:  ? Labs: PCP   reports that she quit smoking about 35 years ago. Her smoking use included Cigarettes. She smoked 0.00 packs per day. She does not have any smokeless tobacco history on file. She reports that she does not drink alcohol.  Past Medical History  Diagnosis Date  . Allergy   . Arthritis   . Colon polyps   . Hypertension     Past Surgical History  Procedure Laterality Date  . Wisdom teeth extraction  1970  . Orthoscopy  1994  . Tonsillectomy      Current Outpatient Prescriptions  Medication Sig Dispense Refill  . fexofenadine (ALLEGRA) 60 MG tablet Take 60 mg by mouth daily.      . fluticasone (FLONASE) 50 MCG/ACT nasal spray Place 2 sprays into both nostrils daily.  16 g  6  . losartan (COZAAR) 50 MG tablet Take 1 tablet (50 mg total) by mouth daily.  90 tablet  3  . triamcinolone cream (KENALOG) 0.1 % Apply 1 application topically 2 (two) times daily.  30 g  0   No current facility-administered medications for this visit.    Family History  Problem Relation Age of Onset  . Heart disease Mother   . Alzheimer's disease Mother   . Heart disease Father     CAD, bypass  . Cancer Maternal Grandfather 26    colon cancer  . Colon cancer Paternal Grandfather 26    ROS:  Pertinent items are noted in HPI.  Otherwise, a comprehensive ROS was negative.  Exam:   BP 122/76  Pulse  84  Ht 5\' 4"  (1.626 m)  Wt 198 lb (89.812 kg)  BMI 33.97 kg/m2  LMP 07/19/1997 Height: 5\' 4"  (162.6 cm)  Ht Readings from Last 3 Encounters:  03/28/14 5\' 4"  (1.626 m)  02/19/14 5' 3.75" (1.619 m)  01/01/13 5\' 7"  (1.702 m)    General appearance: alert, cooperative and appears stated age Head: Normocephalic, without obvious abnormality, atraumatic Neck: no adenopathy, supple, symmetrical, trachea midline and thyroid normal to inspection and palpation Lungs: clear to auscultation bilaterally Breasts: normal appearance, no masses or tenderness Heart: regular rate and rhythm Abdomen: soft, non-tender; no masses,  no organomegaly Extremities: extremities normal, atraumatic, no cyanosis or edema Skin: Skin color, texture, turgor normal. No rashes or lesions Lymph nodes: Cervical, supraclavicular, and axillary nodes normal. No abnormal inguinal nodes palpated Neurologic: Grossly normal   Pelvic: External genitalia:  no lesions              Urethra:  normal appearing urethra with no masses, tenderness or lesions              Bartholin's and Skene's: normal                 Vagina: normal appearing vagina with normal color  and discharge, no lesions              Cervix: anteverted              Pap taken: No. Bimanual Exam:  Uterus:  normal size, contour, position, consistency, mobility, non-tender              Adnexa: no mass, fullness, tenderness               Rectovaginal: Confirms               Anus:  normal sphincter tone, no lesions  A:  Well Woman with normal exam  [postmenopausal  History of OA  HTN  P:   Reviewed health and wellness pertinent to exam  Pap smear not taken today  IFOB given  Counseled on breast self exam, mammography screening, adequate intake of calcium and vitamin D, diet and exercise, Kegel's exercises return annually or prn  An After Visit Summary was printed and given to the patient.

## 2014-03-31 NOTE — Progress Notes (Signed)
Encounter reviewed by Dr. Azeem Poorman Silva.  

## 2014-05-06 ENCOUNTER — Encounter: Payer: Self-pay | Admitting: Nurse Practitioner

## 2014-05-06 ENCOUNTER — Telehealth: Payer: Self-pay | Admitting: Nurse Practitioner

## 2014-05-06 ENCOUNTER — Ambulatory Visit (INDEPENDENT_AMBULATORY_CARE_PROVIDER_SITE_OTHER): Payer: BC Managed Care – PPO | Admitting: Nurse Practitioner

## 2014-05-06 VITALS — BP 138/90 | HR 68 | Resp 18 | Ht 64.0 in | Wt 200.0 lb

## 2014-05-06 DIAGNOSIS — N766 Ulceration of vulva: Secondary | ICD-10-CM

## 2014-05-06 NOTE — Telephone Encounter (Signed)
Pt has noticed a bump about the size of M&M on her labia. Wants to come in for appt.

## 2014-05-06 NOTE — Patient Instructions (Signed)
Epidermal Cyst An epidermal cyst is sometimes called a sebaceous cyst, epidermal inclusion cyst, or infundibular cyst. These cysts usually contain a substance that looks "pasty" or "cheesy" and may have a bad smell. This substance is a protein called keratin. Epidermal cysts are usually found on the face, neck, or trunk. They may also occur in the vaginal area or other parts of the genitalia of both men and women. Epidermal cysts are usually small, painless, slow-growing bumps or lumps that move freely under the skin. It is important not to try to pop them. This may cause an infection and lead to tenderness and swelling. CAUSES  Epidermal cysts may be caused by a deep penetrating injury to the skin or a plugged hair follicle, often associated with acne. SYMPTOMS  Epidermal cysts can become inflamed and cause:  Redness.  Tenderness.  Increased temperature of the skin over the bumps or lumps.  Grayish-white, bad smelling material that drains from the bump or lump. DIAGNOSIS  Epidermal cysts are easily diagnosed by your caregiver during an exam. Rarely, a tissue sample (biopsy) may be taken to rule out other conditions that may resemble epidermal cysts. TREATMENT   Epidermal cysts often get better and disappear on their own. They are rarely ever cancerous.  If a cyst becomes infected, it may become inflamed and tender. This may require opening and draining the cyst. Treatment with antibiotics may be necessary. When the infection is gone, the cyst may be removed with minor surgery.  Small, inflamed cysts can often be treated with antibiotics or by injecting steroid medicines.  Sometimes, epidermal cysts become large and bothersome. If this happens, surgical removal in your caregiver's office may be necessary. HOME CARE INSTRUCTIONS  Only take over-the-counter or prescription medicines as directed by your caregiver.  Take your antibiotics as directed. Finish them even if you start to feel  better. SEEK MEDICAL CARE IF:   Your cyst becomes tender, red, or swollen.  Your condition is not improving or is getting worse.  You have any other questions or concerns. MAKE SURE YOU:  Understand these instructions.  Will watch your condition.  Will get help right away if you are not doing well or get worse. Document Released: 06/05/2004 Document Revised: 09/27/2011 Document Reviewed: 01/11/2011 [employer] Patient Information 2015 [employer]. This information is not intended to replace advice given to you by your health care provider. Make sure you discuss any questions you have with your health care provider.  

## 2014-05-06 NOTE — Telephone Encounter (Signed)
Spoke with patient. Patient states that when she came in for her annual with Patty she was told that she has "sebaceous pimples that come with age." Over the last few days patient has noticed that one has been growing in size and has gotten painful. States that is is sore all the time and is about the size of an M&M. "I can't really see it though." Advised patient will need to be seen with Milford Cage, FNP for evaluation. Patient is agreeable. Requests appointment after 2pm due to work. Appointment scheduled for today at 2:15pm with Milford Cage, [place]. Patient is agreeable to date and time.  Routing to provider for final review. Patient agreeable to disposition. Will close encounter

## 2014-05-06 NOTE — Progress Notes (Signed)
Subjective:     Patient ID: Melinda Ferguson, female   DOB: 02/10/1951, 63 y.o.   MRN: [id number]  HPI  This 63 yo WM Fe has had a history of vaginal dermal sebaceous cyst.  Recently she had one that came up and was bothersome she then squeased to get it out and area became very sore and swollen.  She has applied Neosporin on the area last pm and feels better now.  Not SA.  No history of HSV and no recent URI symptoms.     Review of Systems  Constitutional: Negative for fever and chills.  Respiratory: Negative.   Gastrointestinal: Negative.   Genitourinary: Positive for genital sores. Negative for dysuria, urgency, frequency, hematuria, flank pain, decreased urine volume, vaginal bleeding, vaginal discharge, difficulty urinating and pelvic pain.  Musculoskeletal: Negative.   Skin: Negative.   Neurological: Negative.   Hematological: Negative.   Psychiatric/Behavioral: Negative.        Objective:   Physical Exam  Constitutional: She is oriented to person, place, and time. She appears well-developed and well-nourished. No distress.  Genitourinary:     Right side of labia is a small area of what appears to be the top of a cyst that has opened up.  Very little exudate.  A herpes culture is obtained but is not classic for this.  Several smaller white epidermal cyst are around the area and not infected. No induration and very little erythema.  Neurological: She is alert and oriented to person, place, and time.  Psychiatric: She has a normal mood and affect. Her behavior is normal. Judgment and thought content normal.       Assessment:     Most likely an epidermal cyst that is now healting Will rule out HSV - but doubtful    Plan:     Continue to do warm compresses and Neosporin ointment prn.  If no improvement to call back Will call with HSV results.

## 2014-05-07 ENCOUNTER — Encounter: Payer: Self-pay | Admitting: Nurse Practitioner

## 2014-05-08 LAB — HERPES SIMPLEX VIRUS CULTURE: ORGANISM ID, BACTERIA: NOT DETECTED

## 2014-05-11 NOTE — Progress Notes (Signed)
Encounter reviewed by Dr. Brook Silva.  

## 2014-05-20 ENCOUNTER — Encounter: Payer: Self-pay | Admitting: Nurse Practitioner

## 2014-05-29 ENCOUNTER — Encounter: Payer: BC Managed Care – PPO | Admitting: Family Medicine

## 2014-05-29 NOTE — Progress Notes (Signed)
Error   This encounter was created in error - please disregard. 

## 2014-07-02 ENCOUNTER — Ambulatory Visit (INDEPENDENT_AMBULATORY_CARE_PROVIDER_SITE_OTHER): Payer: BC Managed Care – PPO | Admitting: Family Medicine

## 2014-07-02 ENCOUNTER — Encounter: Payer: Self-pay | Admitting: Family Medicine

## 2014-07-02 VITALS — BP 136/82 | HR 86 | Temp 98.7°F | Ht 64.0 in

## 2014-07-02 DIAGNOSIS — J069 Acute upper respiratory infection, unspecified: Secondary | ICD-10-CM

## 2014-07-02 MED ORDER — BENZONATATE 100 MG PO CAPS
100.0000 mg | ORAL_CAPSULE | Freq: Two times a day (BID) | ORAL | Status: DC | PRN
Start: 2014-07-02 — End: 2014-12-17

## 2014-07-02 NOTE — Patient Instructions (Signed)
INSTRUCTIONS FOR UPPER RESPIRATORY INFECTION:  -plenty of rest and fluids  -nasal saline wash 2-3 times daily (use prepackaged nasal saline or bottled/distilled water if making your own)   -flonase daily  -can use AFRIN nasal spray for drainage and nasal congestion - but do NOT use longer then 3-4 days  -can use tylenol or ibuprofen as directed for aches and sorethroat  -in the winter time, using a humidifier at night is helpful (please follow cleaning instructions)  -if you are taking a cough medication - use only as directed, may also try a teaspoon of honey to coat the throat and throat lozenges  -for sore throat, salt water gargles can help  -follow up if you have fevers, facial pain, tooth pain, difficulty breathing or are worsening or not getting better in 5-7 days

## 2014-07-02 NOTE — Progress Notes (Signed)
Pre visit review using our clinic review tool, if applicable. No additional management support is needed unless otherwise documented below in the visit note. 

## 2014-07-02 NOTE — Progress Notes (Signed)
HPI:  URI: -started: yesterday -symptoms:nasal congestion, sore throat, cough, PND, sneezing and coughing -denies: fever, SOB, NVD, tooth pain, sinus pain -has tried: allegra, saline spray, musinex, flonase -sick contacts/travel/risks: denies flu exposure, tick exposure or or Ebola risks -Hx of: allergies  ROS: See pertinent positives and negatives per HPI.  Past Medical History  Diagnosis Date  . Allergy   . Arthritis   . Colon polyps   . Hypertension     Past Surgical History  Procedure Laterality Date  . Wisdom teeth extraction  1970  . Orthoscopy  1994  . Tonsillectomy      Family History  Problem Relation Age of Onset  . Heart disease Mother   . Alzheimer's disease Mother   . Heart disease Father     CAD, bypass  . Cancer Maternal Grandfather 26    colon cancer  . Colon cancer Paternal Grandfather 35    History   Social History  . Marital Status: Married    Spouse Name: N/A    Number of Children: N/A  . Years of Education: N/A   Occupational History  . language facilitator    Social History Main Topics  . Smoking status: Former Smoker    Types: Cigarettes    Quit date: 01/04/1979  . Smokeless tobacco: None  . Alcohol Use: No  . Drug Use: None  . Sexual Activity: Yes   Other Topics Concern  . 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      Home Situation: lives with husband      Spiritual Beliefs: none      Lifestyle: walks about 1 hour daily; no diet plan             Current outpatient prescriptions: fexofenadine (ALLEGRA) 60 MG tablet, Take 60 mg by mouth daily., Disp: , Rfl: ;  fluticasone (FLONASE) 50 MCG/ACT nasal spray, Place 2 sprays into both nostrils daily., Disp: 16 g, Rfl: 6;  losartan (COZAAR) 50 MG tablet, Take 1 tablet (50 mg total) by mouth daily., Disp: 90 tablet, Rfl: 3 benzonatate (TESSALON) 100 MG capsule, Take 1 capsule (100 mg total) by mouth 2 (two)  times daily as needed for cough., Disp: 20 capsule, Rfl: 0  EXAM:  Filed Vitals:   07/02/14 1554  BP: 136/82  Pulse: 86  Temp: 98.7 F (37.1 C)    Body mass index is 0.00 kg/(m^2).  GENERAL: vitals reviewed and listed above, alert, oriented, appears well hydrated and in no acute distress  HEENT: atraumatic, conjunttiva clear, no obvious abnormalities on inspection of external nose and ears, normal appearance of ear canals and TMs, clear nasal congestion, mild post oropharyngeal erythema with PND, no tonsillar edema or exudate, no sinus TTP  NECK: no obvious masses on inspection  LUNGS: clear to auscultation bilaterally, no wheezes, rales or rhonchi, good air movement  CV: HRRR, no peripheral edema  MS: moves all extremities without noticeable abnormality  PSYCH: pleasant and cooperative, no obvious depression or anxiety  ASSESSMENT AND PLAN:  Discussed the following assessment and plan:  Acute upper respiratory infection - Plan: benzonatate (TESSALON) 100 MG capsule  -given HPI and exam findings today, a serious infection or illness is unlikely. We discussed potential etiologies, with VURI being most likely, and advised supportive care and monitoring. We discussed treatment side effects, likely course, antibiotic misuse, transmission, and signs of developing a serious illness. -of course, we advised to return or  notify a doctor immediately if symptoms worsen or persist or new concerns arise.    Patient Instructions  INSTRUCTIONS FOR UPPER RESPIRATORY INFECTION:  -plenty of rest and fluids  -nasal saline wash 2-3 times daily (use prepackaged nasal saline or bottled/distilled water if making your own)   -flonase daily  -can use AFRIN nasal spray for drainage and nasal congestion - but do NOT use longer then 3-4 days  -can use tylenol or ibuprofen as directed for aches and sorethroat  -in the winter time, using a humidifier at night is helpful (please follow cleaning  instructions)  -if you are taking a cough medication - use only as directed, may also try a teaspoon of honey to coat the throat and throat lozenges  -for sore throat, salt water gargles can help  -follow up if you have fevers, facial pain, tooth pain, difficulty breathing or are worsening or not getting better in 5-7 days      KIM, HANNAH R.

## 2014-07-09 ENCOUNTER — Other Ambulatory Visit: Payer: Self-pay | Admitting: *Deleted

## 2014-07-09 MED ORDER — FLUTICASONE PROPIONATE 50 MCG/ACT NA SUSP: 2.0000 | Freq: Every day | NASAL | Status: DC

## 2014-12-17 ENCOUNTER — Encounter: Payer: Self-pay | Admitting: Family Medicine

## 2014-12-17 ENCOUNTER — Ambulatory Visit (INDEPENDENT_AMBULATORY_CARE_PROVIDER_SITE_OTHER): Payer: BC Managed Care – PPO | Admitting: Family Medicine

## 2014-12-17 VITALS — BP 138/88 | HR 87 | Temp 98.1°F | Ht 64.0 in

## 2014-12-17 DIAGNOSIS — J069 Acute upper respiratory infection, unspecified: Secondary | ICD-10-CM | POA: Diagnosis not present

## 2014-12-17 DIAGNOSIS — I10 Essential (primary) hypertension: Secondary | ICD-10-CM

## 2014-12-17 NOTE — Patient Instructions (Signed)

## 2014-12-17 NOTE — Progress Notes (Signed)
Pre visit review using our clinic review tool, if applicable. No additional management support is needed unless otherwise documented below in the visit note. Patient declines weight.

## 2014-12-17 NOTE — Progress Notes (Signed)
HPI:  URI: -started: 2 days -symptoms:nasal congestion, sore throat, cough, tired -denies: fever, SOB, NVD, tooth pain, facial pain -has tried: cough drops, sudafed, OTC cold medication -sick contacts/travel/risks: denies flu or strep exposure, tick exposure or or Ebola risks, she is in the school system -Hx of: allergies  HTN: -did not take her BP medication this morning -denies: CP, SOB  ROS: See pertinent positives and negatives per HPI.  Past Medical History  Diagnosis Date  . Allergy   . Arthritis   . Colon polyps   . Hypertension     Past Surgical History  Procedure Laterality Date  . Wisdom teeth extraction  1970  . Orthoscopy  1994  . Tonsillectomy      Family History  Problem Relation Age of Onset  . Heart disease Mother   . Alzheimer's disease Mother   . Heart disease Father     CAD, bypass  . Cancer Maternal Grandfather 26    colon cancer  . Colon cancer Paternal Grandfather 90    History   Social History  . Marital Status: Married    Spouse Name: N/A  . Number of Children: N/A  . Years of Education: N/A   Occupational History  . language facilitator    Social History Main Topics  . Smoking status: Former Smoker    Types: Cigarettes    Quit date: 01/04/1979  . Smokeless tobacco: Not on file  . Alcohol Use: No  . Drug Use: Not on file  . Sexual Activity: Yes   Other Topics Concern  . 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  fexofenadine (ALLEGRA) 60 MG tablet, Take 60 mg by mouth daily., Disp: , Rfl:  .  fluticasone (FLONASE) 50 MCG/ACT nasal spray, Place 2 sprays into both nostrils daily., Disp: 16 g, Rfl: 6 .  losartan (COZAAR) 50 MG tablet, Take 1 tablet (50 mg total) by mouth  daily., Disp: 90 tablet, Rfl: 3  EXAM:  Filed Vitals:   12/17/14 0928  BP: 138/88  Pulse: 87  Temp: 98.1 F (36.7 C)    There is no weight on file to calculate BMI.  GENERAL: vitals reviewed and listed above, alert, oriented, appears well hydrated and in no acute distress  HEENT: atraumatic, conjunttiva clear, no obvious abnormalities on inspection of external nose and ears, normal appearance of ear canals and TMs, clear nasal congestion, mild post oropharyngeal erythema with PND, no tonsillar edema or exudate, no sinus TTP  NECK: no obvious masses on inspection  LUNGS: clear to auscultation bilaterally, no wheezes, rales or rhonchi, good air movement  CV: HRRR, no peripheral edema  MS: moves all extremities without noticeable abnormality  PSYCH: pleasant and cooperative, no obvious depression or anxiety  ASSESSMENT AND PLAN:  Discussed the following assessment and plan:  Acute upper respiratory infection  Essential hypertension  -given HPI and exam findings today, a serious infection or illness is unlikely. We discussed potential etiologies, with VURI being most likely, and advised supportive care and monitoring. We discussed treatment side effects, likely course, antibiotic misuse, transmission, and signs of developing a serious illness. -BP better on recheck, advised  to follow up in 1 month to recheck -of course, we advised to return or notify a doctor immediately if symptoms worsen or persist or new concerns arise.    Patient Instructions  INSTRUCTIONS FOR UPPER RESPIRATORY INFECTION:  -plenty of rest and fluids  -nasal saline wash 2-3 times daily (use prepackaged nasal saline or bottled/distilled water if making your own)   -can use AFRIN nasal spray for drainage and nasal congestion - but do NOT use longer then 3-4 days  -can use tylenol (in no history of liver disease) or ibuprofen (if no history of kidney disease, bowel bleeding or significant heart disease)  as directed for aches and sorethroat  -in the winter time, using a humidifier at night is helpful (please follow cleaning instructions)  -if you are taking a cough medication - use only as directed, may also try a teaspoon of honey to coat the throat and throat lozenges. If given a cough medication with codeine or hydrocodone or other narcotic please be advised that this contains a strong and  potentially addicting medication. Please follow instructions carefully, take as little as possible and only use AS NEEDED for severe cough. Discuss potential side effects with your pharmacy. Please do not drive or operate machinery while taking these types of medications. Please do not take other sedating medications, drugs or alcohol while taking this medication without discussing with your doctor.  -for sore throat, salt water gargles can help  -follow up if you have fevers, facial pain, tooth pain, difficulty breathing or are worsening or symptoms persist longer then expected  Upper Respiratory Infection, Adult An upper respiratory infection (URI) is also known as the common cold. It is often caused by a type of germ (virus). Colds are easily spread (contagious). You can pass it to others by kissing, coughing, sneezing, or drinking out of the same glass. Usually, you get better in 1 to 3  weeks.  However, the cough can last for even longer. HOME CARE   Only take medicine as told by your doctor. Follow instructions provided above.  Drink enough water and fluids to keep your pee (urine) clear or pale yellow.  Get plenty of rest.  Return to work when your temperature is < 100 for 24 hours or as told by your doctor. You may use a face mask and wash your hands to stop your cold from spreading. GET HELP RIGHT AWAY IF:   After the first few days, you feel you are getting worse.  You have questions about your medicine.  You have chills, shortness of breath, or red spit (mucus).  You have pain in the  face for more then 1-2 days, especially when you bend forward.  You have a fever, puffy (swollen) neck, pain when you swallow, or white spots in the back of your throat.  You have a bad headache, ear pain, sinus pain, or chest pain.  You have a high-pitched whistling sound when you breathe in and out (wheezing).  You cough up blood.  You have sore muscles or a stiff neck. MAKE SURE YOU:   Understand these instructions.  Will watch your condition.  Will get help right away if you are not doing well or get worse. Document Released: 12/22/2007 Document Revised: 09/27/2011 Document Reviewed: 10/10/2013 [facility] Patient Information 2015 [place], [place]. This information is not intended to replace advice given to you by your health care provider. Make sure you discuss any questions you have with your health care provider.  Colin Benton R.

## 2014-12-30 ENCOUNTER — Telehealth: Payer: Self-pay | Admitting: Family Medicine

## 2014-12-30 DIAGNOSIS — M79672 Pain in left foot: Secondary | ICD-10-CM

## 2014-12-30 NOTE — Telephone Encounter (Signed)
Pt is asking if you know of a podiatrist she may go see .Marland KitchenWould like a call back

## 2014-12-31 NOTE — Telephone Encounter (Signed)
Left a message for the pt to return my call.  

## 2014-12-31 NOTE — Telephone Encounter (Signed)
I called the pt and she stated she has a painful corn or callous on the end of her toe (left foot)  that has been there for a while now and she would like to see someone to see what her options are?

## 2014-12-31 NOTE — Telephone Encounter (Signed)
Can you find out what type of foot issue she is having? If a new problem, maybe we could see her here or refer to [facility].

## 2014-12-31 NOTE — Telephone Encounter (Signed)
[place]. Referral sent for left foot pain.

## 2015-01-02 NOTE — Telephone Encounter (Signed)
Left a detailed message on the pts cell number that the referral was entered and someone will give her a call with appointment information.

## 2015-01-06 ENCOUNTER — Encounter: Payer: BC Managed Care – PPO | Admitting: Family Medicine

## 2015-01-16 ENCOUNTER — Ambulatory Visit: Payer: Self-pay

## 2015-01-16 ENCOUNTER — Encounter: Payer: Self-pay | Admitting: Podiatry

## 2015-01-16 ENCOUNTER — Ambulatory Visit (INDEPENDENT_AMBULATORY_CARE_PROVIDER_SITE_OTHER): Payer: BC Managed Care – PPO | Admitting: Podiatry

## 2015-01-16 VITALS — BP 168/118 | HR 74 | Resp 12

## 2015-01-16 DIAGNOSIS — M2042 Other hammer toe(s) (acquired), left foot: Secondary | ICD-10-CM

## 2015-01-16 NOTE — Progress Notes (Signed)
   Subjective:    Patient ID: Melinda Ferguson, female    DOB: 1951/01/18, 64 y.o.   MRN: [id number]  HPI Patient presents painful callous on L 3rd toe that is the result of a hammertoe deformity. This has been bothering her for over a year. Patient also presents BIL 1-3 nail fungus. She had previously consulted with her dermatologist about her fungal infection.   Review of Systems  Musculoskeletal: Positive for joint swelling.  Skin: Positive for color change.  Allergic/Immunologic: Positive for environmental allergies.  All other systems reviewed and are negative.      Objective:   Physical Exam        Assessment & Plan:

## 2015-01-16 NOTE — Patient Instructions (Signed)
Pre-Operative Instructions  Congratulations, you have decided to take an important step to improving your quality of life.  You can be assured that the doctors of [facility] will be with you every step of the way.  1. Plan to be at the surgery center/hospital at least 1 (one) hour prior to your scheduled time unless otherwise directed by the surgical center/hospital staff.  You must have a responsible adult accompany you, remain during the surgery and drive you home.  Make sure you have directions to the surgical center/hospital and know how to get there on time. 2. For hospital based surgery you will need to obtain a history and physical form from your family physician within 1 month prior to the date of surgery- we will give you a form for you primary physician.  3. We make every effort to accommodate the date you request for surgery.  There are however, times where surgery dates or times have to be moved.  We will contact you as soon as possible if a change in schedule is required.   4. No Aspirin/Ibuprofen for one week before surgery.  If you are on aspirin, any non-steroidal anti-inflammatory medications (Mobic, Aleve, Ibuprofen) you should stop taking it 7 days prior to your surgery.  You make take Tylenol  For pain prior to surgery.  5. Medications- If you are taking daily heart and blood pressure medications, seizure, reflux, allergy, asthma, anxiety, pain or diabetes medications, make sure the surgery center/hospital is aware before the day of surgery so they may notify you which medications to take or avoid the day of surgery. 6. No food or drink after midnight the night before surgery unless directed otherwise by surgical center/hospital staff. 7. No alcoholic beverages 24 hours prior to surgery.  No smoking 24 hours prior to or 24 hours after surgery. 8. Wear loose pants or shorts- loose enough to fit over bandages, boots, and casts. 9. No slip on shoes, sneakers are best. 10. Bring  your boot with you to the surgery center/hospital.  Also bring crutches or a walker if your physician has prescribed it for you.  If you do not have this equipment, it will be provided for you after surgery. 11. If you have not been contracted by the surgery center/hospital by the day before your surgery, call to confirm the date and time of your surgery. 12. Leave-time from work may vary depending on the type of surgery you have.  Appropriate arrangements should be made prior to surgery with your employer. 13. Prescriptions will be provided immediately following surgery by your doctor.  Have these filled as soon as possible after surgery and take the medication as directed. 14. Remove nail polish on the operative foot. 15. Wash the night before surgery.  The night before surgery wash the foot and leg well with the antibacterial soap provided and water paying special attention to beneath the toenails and in between the toes.  Rinse thoroughly with water and dry well with a towel.  Perform this wash unless told not to do so by your physician.  Enclosed: 1 Ice pack (please put in freezer the night before surgery)   1 Hibiclens skin cleaner   Pre-op Instructions  If you have any questions regarding the instructions, do not hesitate to call our office.  [place]: [address] [place], [place] [address] [phone]  [place]: [address] [place], [place] [address] [phone]  [place]: [address]  ,  [address] [phone]  Dr. Richard   Tuchman DPM, Dr. Javi Bollman DPM Dr. Richard Sikora DPM, Dr. M. Todd Hyatt DPM, Dr. Kathryn Egerton DPM 

## 2015-01-16 NOTE — Progress Notes (Signed)
Subjective:     Patient ID: Melinda Ferguson, female   DOB: 1951/07/12, 64 y.o.   MRN: [id number]  HPI patient points the third toe left states it's been very tender when pressed and making it hard to walk on. States that she also has some nail disease and is seen a dermatologist but is not treated but this toe is really starting to make it hard for her to function and trimming and padding is not helping anymore   Review of Systems  All other systems reviewed and are negative.      Objective:   Physical Exam  Constitutional: She is oriented to person, place, and time.  Cardiovascular: Intact distal pulses.   Musculoskeletal: Normal range of motion.  Neurological: She is oriented to person, place, and time.  Skin: Skin is warm.  Nursing note and vitals reviewed.  neurovascular status intact muscle strength adequate range of motion within normal limits. Patient's noted to have good digital perfusion is well oriented 3 and I noted that on the third toe left there is distal lateral keratotic lesion is tender with elongated toe and abnormal position at the distal interphalangeal joint. Asians found to be well perfused and has no equinus and is well oriented 3     Assessment:     Distal hammertoe deformity third toe left with keratotic lesion and pain along with mild mycotic nail disease    Plan:     H&P and x-rays reviewed with patient. I've recommended distal arthroplasty digit 3 left with elevating the toe and I explained the procedure that would be required for this. Patient wants surgery not interested in any trimming and she does this herself or padding which she does herself. Since Rena do it soon due to her work schedule I allowed her to read consent form today reviewing the procedure. Distal arthroplasty recommended and she understands alternative treatments and complications and after reviewing with me she signs consent form and is given all preoperative instructions. Patient is  scheduled for outpatient surgery [place] [facility] in the next several weeks and understands total recovery can take 6 months to one year

## 2015-01-21 ENCOUNTER — Telehealth: Payer: Self-pay | Admitting: *Deleted

## 2015-01-21 NOTE — Telephone Encounter (Signed)
"  The nurse just informed me that the patient wants to cancel her surgery for 01/28/2015."  Did she say why she wanted to cancel?  "The nurse didn't tell me why."  I received a call from the surgical center that you wanted to cancel your surgery.  "Yes, that is correct.  I called your office and left a message.  You may have been gone to lunch."  Would you like to reschedule it?  "No, not at this time."  Give [place] a call if you'd like to reschedule.

## 2015-01-22 NOTE — Telephone Encounter (Signed)
"  I'm scheduled for surgery on July 12 with Dr. Paulla Dolly.  I'm going to cancel that and I'm going to rethink the surgery.  I saw him last Tuesday.  I want to cancel the surgery.  Thank you."

## 2015-02-03 LAB — FECAL OCCULT BLOOD, IMMUNOCHEMICAL: IMMUNOLOGICAL FECAL OCCULT BLOOD TEST: NEGATIVE

## 2015-02-03 NOTE — Addendum Note (Signed)
Addended by: Elroy Channel on: 02/03/2015 09:09 AM   Modules accepted: Orders, SmartSet

## 2015-02-10 ENCOUNTER — Other Ambulatory Visit: Payer: Self-pay

## 2015-02-10 DIAGNOSIS — Z1231 Encounter for screening mammogram for malignant neoplasm of breast: Secondary | ICD-10-CM

## 2015-03-18 ENCOUNTER — Ambulatory Visit
Admission: RE | Admit: 2015-03-18 | Discharge: 2015-03-18 | Disposition: A | Discharge status: Home / Self Care | Payer: BC Managed Care – PPO | Source: Ambulatory Visit

## 2015-03-18 DIAGNOSIS — Z1231 Encounter for screening mammogram for malignant neoplasm of breast: Secondary | ICD-10-CM

## 2015-04-10 ENCOUNTER — Encounter: Payer: BC Managed Care – PPO | Admitting: Family Medicine

## 2015-05-07 ENCOUNTER — Other Ambulatory Visit: Payer: Self-pay | Admitting: Family Medicine

## 2015-07-16 ENCOUNTER — Encounter: Payer: BC Managed Care – PPO | Admitting: Family Medicine

## 2015-08-04 ENCOUNTER — Other Ambulatory Visit: Payer: Self-pay | Admitting: Family Medicine

## 2015-09-07 ENCOUNTER — Other Ambulatory Visit: Payer: Self-pay | Admitting: Family Medicine

## 2015-10-12 ENCOUNTER — Other Ambulatory Visit: Payer: Self-pay | Admitting: Family Medicine

## 2015-10-29 ENCOUNTER — Telehealth: Payer: Self-pay | Admitting: *Deleted

## 2015-10-29 ENCOUNTER — Ambulatory Visit (INDEPENDENT_AMBULATORY_CARE_PROVIDER_SITE_OTHER): Payer: BC Managed Care – PPO | Admitting: Family Medicine

## 2015-10-29 ENCOUNTER — Encounter: Payer: Self-pay | Admitting: Family Medicine

## 2015-10-29 VITALS — BP 136/88 | HR 77 | Temp 98.3°F | Ht 63.5 in | Wt 207.3 lb

## 2015-10-29 DIAGNOSIS — Z0000 Encounter for general adult medical examination without abnormal findings: Secondary | ICD-10-CM | POA: Diagnosis not present

## 2015-10-29 DIAGNOSIS — Z23 Encounter for immunization: Secondary | ICD-10-CM

## 2015-10-29 LAB — LIPID PANEL
CHOLESTEROL: 212 mg/dL — AB (ref 0–200)
HDL: 65.1 mg/dL (ref >39.00)
LDL CALC: 134 mg/dL — AB (ref 0–99)
NonHDL: 147.17
TRIGLYCERIDES: 66 mg/dL (ref 0.0–149.0)
Total CHOL/HDL Ratio: 3
VLDL: 13.2 mg/dL (ref 0.0–40.0)

## 2015-10-29 LAB — BASIC METABOLIC PANEL
BUN: 13 mg/dL (ref 6–23)
CHLORIDE: 103 meq/L (ref 96–112)
CO2: 27 mEq/L (ref 19–32)
Calcium: 9.3 mg/dL (ref 8.4–10.5)
Creatinine, Ser: 0.73 mg/dL (ref 0.40–1.20)
GFR: 84.7 mL/min (ref >60.00)
Glucose, Bld: 104 mg/dL — ABNORMAL HIGH (ref 70–99)
POTASSIUM: 4.7 meq/L (ref 3.5–5.1)
SODIUM: 138 meq/L (ref 135–145)

## 2015-10-29 NOTE — Telephone Encounter (Signed)
Dr Maudie Mercury reviewed the pts chart and states she noticed the pt is due for a colonoscopy in July 2017, not July 2018 per the letter from Dr Fuller Plan.  I left a message for the pt to return my call.

## 2015-10-29 NOTE — Progress Notes (Signed)
HPI:  Here for CPE:  -Concerns and/or follow up today:   HTN: -stable -on losartan 50 mg daily -needs refill  Obesity: -scared to take weight loss medications after reading side effects -wants to loose weight, did prep program but did not like the group sessions -Diet and exercise not as good recently -She has trouble sticking to any planned and overeating is a weakness as does not like to feel hungry  Knee pain: -sees dr. Gemma Payor -wants to know what to take  L tennis elbow/occ L thoracic back pain: -Occasional tennis elbow that responds well to home exercises, wonders if okay to take anti-inflammatory medication from time to time -Occasional upper back pain, usually after doing certain things, no radiation, weakness malaise or persistent symptoms, not exertional  -Diet: variety of foods, balance and well rounded, larger portion sizes  -Exercise: no regular exercise  -Taking folic acid, vitamin D or calcium: no  -Diabetes and Dyslipidemia Screening: Fasting for labs today  -Vaccines: See health maintenance section  -pap history: sees gynecologist - [place]  -FDLMP: n/a  -sexual activity: yes, female partner, no new partners  -wants STI testing (Hep C if born [birth year]): no  -FH breast, colon or ovarian ca: see FH Last mammogram: She reports that this is scheduled for August/2017 Last colon cancer screening: Up-to-date, due in July 2017 on review of chart after visit  Breast Ca Risk Assessment: -Sees gynecologist for her breast health, mammogram is scheduled, no family history of breast or ovarian cancer  -Alcohol, Tobacco, drug use: see social history  Review of Systems - no fevers, unintentional weight loss, vision loss, hearing loss, chest pain, sob, hemoptysis, melena, hematochezia, hematuria, genital discharge, changing or concerning skin lesions, bleeding, bruising, loc, thoughts of self harm or SI  Past Medical History  Diagnosis Date  . Allergy   .  Arthritis   . Colon polyps   . Hypertension     Past Surgical History  Procedure Laterality Date  . Wisdom teeth extraction  1970  . Orthoscopy  1994  . Tonsillectomy      Family History  Problem Relation Age of Onset  . Heart disease Mother   . Alzheimer's disease Mother   . Heart disease Father     CAD, bypass  . Cancer Maternal Grandfather 26    colon cancer  . Colon cancer Paternal Grandfather 65    Social History   Social History  . Marital Status: Married    Spouse Name: N/A  . Number of Children: N/A  . Years of Education: N/A   Occupational History  . language facilitator    Social History Main Topics  . Smoking status: Former Smoker    Types: Cigarettes    Quit date: 01/04/1979  . Smokeless tobacco: None  . Alcohol Use: No  . Drug Use: None  . Sexual Activity: Yes   Other Topics Concern  . 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  fexofenadine (ALLEGRA) 60 MG tablet, Take 60 mg by mouth daily., Disp: , Rfl:  .  fluticasone (FLONASE) 50 MCG/ACT nasal spray, USE 2 SPRAYS IN EACH NOSTRIL ONCE DAILY, Disp:  16 g, Rfl: 0 .  losartan (COZAAR) 50 MG tablet, TAKE 1 TABLET DAILY., Disp: 30 tablet, Rfl: 5  EXAM:  Filed Vitals:   10/29/15 0911  BP: 136/88  Pulse: 77  Temp: 98.3 F (36.8 C)    GENERAL: vitals reviewed and listed below, alert, oriented, appears well hydrated and in no acute distress  HEENT: head atraumatic, PERRLA, normal appearance of eyes, ears, nose and mouth. moist mucus membranes.  NECK: supple, no masses or lymphadenopathy  LUNGS: clear to auscultation bilaterally, no rales, rhonchi or wheeze  CV: HRRR, no peripheral edema or cyanosis, normal pedal pulses  BREAST:  Gynecologist  ABDOMEN: bowel sounds normal, soft, non tender to palpation, no masses, no rebound or guarding  GU: Sees gynecologist  SKIN: no rash or abnormal lesions, declined full skin exam and she sees a dermatologist for this  MS: normal gait, moves all extremities normally  NEURO: CN II-XII grossly intact, normal muscle strength and sensation to light touch on extremities  PSYCH: normal affect, pleasant and cooperative  ASSESSMENT AND PLAN:  Discussed the following assessment and plan:  Visit for preventive health examination - Plan: Lipid panel, Basic metabolic panel  Need for prophylactic vaccination against Streptococcus pneumoniae (pneumococcus) - Plan: Pneumococcal conjugate vaccine 13-valent   -Discussed and advised all [place] preventive services health task force level A and B recommendations for age, sex and risks.  -Advised at least 150 minutes of exercise per week and a healthy diet low in saturated fats and sweets and consisting of fresh fruits and vegetables, lean meats such as fish and white chicken and whole grains.  -Fasting labs, studies and vaccines per orders this encounter  -She wants to follow up every 3 months for accountability regarding her lifestyle changes  -Noted after she left that she is due for a colonoscopy in July 2017, will have my assistant let her know and mail a letter from her gastroenterologist to her house as a reminder.  Orders Placed This Encounter  Procedures  . Pneumococcal conjugate vaccine 13-valent  . Lipid panel  . Basic metabolic panel    Patient advised to return to clinic immediately if symptoms worsen or persist or new concerns.  Patient Instructions  Before you leave: -Prevnar 13 -Schedule a follow-up in 3 months -Labs  We recommend the following healthy lifestyle measures: - eat a healthy whole foods diet consisting of regular small meals composed of vegetables, fruits, beans, nuts, seeds, healthy meats such as  white chicken and fish and whole grains.  - avoid sweets, white starchy foods, fried foods, fast food, processed foods, sodas, red meet and other fattening foods.  - get a least 150-300 minutes of aerobic exercise per week.    -We have ordered labs or studies at this visit. It can take up to 1-2 weeks for results and processing. We will contact you with instructions IF your results are abnormal. Normal results will be released to your [facility]. If you have not heard from [place] or can not find your results in [facility] in 2 weeks please contact our office.  Vit D3 [phone] IU daily          No Follow-up on file.  Colin Benton R.

## 2015-10-29 NOTE — Patient Instructions (Signed)
Before you leave: -Prevnar 13 -Schedule a follow-up in 3 months -Labs  We recommend the following healthy lifestyle measures: - eat a healthy whole foods diet consisting of regular small meals composed of vegetables, fruits, beans, nuts, seeds, healthy meats such as white chicken and fish and whole grains.  - avoid sweets, white starchy foods, fried foods, fast food, processed foods, sodas, red meet and other fattening foods.  - get a least 150-300 minutes of aerobic exercise per week.    -We have ordered labs or studies at this visit. It can take up to 1-2 weeks for results and processing. We will contact you with instructions IF your results are abnormal. Normal results will be released to your [facility]. If you have not heard from [place] or can not find your results in [facility] in 2 weeks please contact our office.  Vit D3 [phone] IU daily

## 2015-10-29 NOTE — Telephone Encounter (Signed)
The pt called back and I informed her of the message below.  She stated she was aware she was due this summer and usually does this when she is out of school. I gave her the phone number to call Dr Lynne Leader office to schedule this.

## 2015-10-29 NOTE — Progress Notes (Signed)
Pre visit review using our clinic review tool, if applicable. No additional management support is needed unless otherwise documented below in the visit note. 

## 2015-11-24 ENCOUNTER — Encounter: Payer: Self-pay | Admitting: Gastroenterology

## 2016-01-26 ENCOUNTER — Ambulatory Visit (AMBULATORY_SURGERY_CENTER): Payer: Self-pay | Admitting: *Deleted

## 2016-01-26 ENCOUNTER — Ambulatory Visit: Payer: BC Managed Care – PPO | Admitting: Family Medicine

## 2016-01-26 VITALS — Ht 65.0 in | Wt 207.6 lb

## 2016-01-26 DIAGNOSIS — Z1211 Encounter for screening for malignant neoplasm of colon: Secondary | ICD-10-CM

## 2016-01-26 MED ORDER — NA SULFATE-K SULFATE-MG SULF 17.5-3.13-1.6 GM/177ML PO SOLN: ORAL | Status: DC

## 2016-01-26 NOTE — Progress Notes (Signed)
No egg or soy allergy  No anesthesia or intubation problems per pt  No diet medications taken  No home oxygen used   

## 2016-01-29 ENCOUNTER — Encounter: Payer: Self-pay | Admitting: Gastroenterology

## 2016-02-05 ENCOUNTER — Other Ambulatory Visit: Payer: Self-pay | Admitting: Family Medicine

## 2016-02-05 DIAGNOSIS — Z1231 Encounter for screening mammogram for malignant neoplasm of breast: Secondary | ICD-10-CM

## 2016-02-11 ENCOUNTER — Encounter: Payer: Self-pay | Admitting: Gastroenterology

## 2016-02-11 ENCOUNTER — Ambulatory Visit (AMBULATORY_SURGERY_CENTER): Payer: BC Managed Care – PPO | Admitting: Gastroenterology

## 2016-02-11 VITALS — BP 133/73 | HR 78 | Temp 97.5°F | Resp 13 | Ht 65.0 in | Wt 207.0 lb

## 2016-02-11 DIAGNOSIS — Z1211 Encounter for screening for malignant neoplasm of colon: Secondary | ICD-10-CM

## 2016-02-11 DIAGNOSIS — D120 Benign neoplasm of cecum: Secondary | ICD-10-CM | POA: Diagnosis not present

## 2016-02-11 NOTE — Progress Notes (Signed)
No problems noted in the recovery room. maw 

## 2016-02-11 NOTE — Op Note (Signed)
[facility] Patient Name: Melinda Ferguson Procedure Date: 02/11/2016 1:17 PM MRN: [id number] Endoscopist: Ladene Artist , MD Age: 65 Referring MD:  Date of Birth: 03-Aug-1950 Gender: Female Account #: [id number] Procedure:                Colonoscopy Indications:              Screening for colorectal malignant neoplasm Medicines:                Monitored Anesthesia Care Procedure:                Pre-Anesthesia Assessment:                           - Prior to the procedure, a History and Physical                            was performed, and patient medications and                            allergies were reviewed. The patient's tolerance of                            previous anesthesia was also reviewed. The risks                            and benefits of the procedure and the sedation                            options and risks were discussed with the patient.                            All questions were answered, and informed consent                            was obtained. Prior Anticoagulants: The patient has                            taken no previous anticoagulant or antiplatelet                            agents. ASA Grade Assessment: II - A patient with                            mild systemic disease. After reviewing the risks                            and benefits, the patient was deemed in                            satisfactory condition to undergo the procedure.                           After obtaining informed consent, the colonoscope  was passed under direct vision. Throughout the                            procedure, the patient's blood pressure, pulse, and                            oxygen saturations were monitored continuously. The                            Model PCF-H190DL [id number]) scope was introduced                            through the anus and advanced to the the cecum,                            identified by  appendiceal orifice and ileocecal                            valve. The ileocecal valve, appendiceal orifice,                            and rectum were photographed. The quality of the                            bowel preparation was excellent. The colonoscopy                            was performed without difficulty. The patient                            tolerated the procedure well. Scope In: 1:33:55 PM Scope Out: 1:44:44 PM Scope Withdrawal Time: 0 hours 8 minutes 56 seconds  Total Procedure Duration: 0 hours 10 minutes 49 seconds  Findings:                 A 4 mm polyp was found in the ileocecal valve. The                            polyp was sessile. The polyp was removed with a                            cold biopsy forceps. The polyp was removed with a                            cold snare. Resection and retrieval were complete.                           Multiple small-mouthed diverticula were found in                            the sigmoid colon. There was narrowing of the colon                            in  association with the diverticular opening. There                            was evidence of diverticular spasm. There was no                            evidence of diverticular bleeding.                           The exam was otherwise without abnormality on                            direct and retroflexion views. Complications:            No immediate complications. Estimated blood loss:                            None. Estimated Blood Loss:     Estimated blood loss: none. Impression:               - One 4 mm polyp at the ileocecal valve, removed                            with a cold snare and removed with a cold biopsy                            forceps. Resected and retrieved.                           - Moderate diverticulosis in the sigmoid colon.                           - The examination was otherwise normal on direct                            and retroflexion  views. Recommendation:           - Repeat colonoscopy in 5 years for surveillance if                            polyps is precancerous, otherwise screening                            colonoscopy in 10 years.                           - Patient has a contact number available for                            emergencies. The signs and symptoms of potential                            delayed complications were discussed with the                            patient. Return to normal activities  tomorrow.                            Written discharge instructions were provided to the                            patient.                           - Continue present medications.                           - Await pathology results.                           - High fiber diet [Duration]. Ladene Artist, MD 02/11/2016 1:54:11 PM This report has been signed electronically.

## 2016-02-11 NOTE — Progress Notes (Signed)
Called to room to assist during endoscopic procedure.  Patient ID and intended procedure confirmed with present staff. Received instructions for my participation in the procedure from the performing physician.  

## 2016-02-11 NOTE — Patient Instructions (Signed)
YOU HAD AN ENDOSCOPIC PROCEDURE TODAY AT THE [facility]:   Refer to the procedure report that was given to you for any specific questions about what was found during the examination.  If the procedure report does not answer your questions, please call your gastroenterologist to clarify.  If you requested that your care partner not be given the details of your procedure findings, then the procedure report has been included in a sealed envelope for you to review at your convenience later.  YOU SHOULD EXPECT: Some feelings of bloating in the abdomen. Passage of more gas than usual.  Walking can help get rid of the air that was put into your GI tract during the procedure and reduce the bloating. If you had a lower endoscopy (such as a colonoscopy or flexible sigmoidoscopy) you may notice spotting of blood in your stool or on the toilet paper. If you underwent a bowel prep for your procedure, you may not have a normal bowel movement for a few days.  Please Note:  You might notice some irritation and congestion in your nose or some drainage.  This is from the oxygen used during your procedure.  There is no need for concern and it should clear up in a day or so.  SYMPTOMS TO REPORT IMMEDIATELY:   Following lower endoscopy (colonoscopy or flexible sigmoidoscopy):  Excessive amounts of blood in the stool  Significant tenderness or worsening of abdominal pains  Swelling of the abdomen that is new, acute  Fever of 100F or higher   Following upper endoscopy (EGD)  Vomiting of blood or coffee ground material  New chest pain or pain under the shoulder blades  Painful or persistently difficult swallowing  New shortness of breath  Fever of 100F or higher  Black, tarry-looking stools  For urgent or emergent issues, a gastroenterologist can be reached at any hour by calling [phone].   DIET: Your first meal following the procedure should be a small meal and then it is ok to progress to  your normal diet. Heavy or fried foods are harder to digest and may make you feel nauseous or bloated.  Likewise, meals heavy in dairy and vegetables can increase bloating.  Drink plenty of fluids but you should avoid alcoholic beverages for 24 hours.  ACTIVITY:  You should plan to take it easy for the rest of today and you should NOT DRIVE or use heavy machinery until tomorrow (because of the sedation medicines used during the test).    FOLLOW UP: Our staff will call the number listed on your records the next business day following your procedure to check on you and address any questions or concerns that you may have regarding the information given to you following your procedure. If we do not reach you, we will leave a message.  However, if you are feeling well and you are not experiencing any problems, there is no need to return our call.  We will assume that you have returned to your regular daily activities without incident.  If any biopsies were taken you will be contacted by phone or by letter within the next 1-3 weeks.  Please call us at [phone] if you have not heard about the biopsies in 3 weeks.    SIGNATURES/CONFIDENTIALITY: You and/or your care partner have signed paperwork which will be entered into your electronic medical record.  These signatures attest to the fact that that the information above on your After Visit Summary has been reviewed   and is understood.  Full responsibility of the confidentiality of this discharge information lies with you and/or your care-partner.    Handouts were given to your care partner on polyps, diverticulosis, and a high fiber diet with liberal fluid intake. You may resume your current medications today. Await biopsy results. Please call if any questions or concerns.   

## 2016-02-11 NOTE — Progress Notes (Signed)
To recovery, report to Willis, RN, VSS 

## 2016-02-12 ENCOUNTER — Telehealth: Payer: Self-pay | Admitting: *Deleted

## 2016-02-12 ENCOUNTER — Other Ambulatory Visit: Payer: Self-pay | Admitting: Family Medicine

## 2016-02-12 NOTE — Telephone Encounter (Signed)
  Follow up Call-  Call back number 02/11/2016  Post procedure Call Back phone  # [phone]  Permission to leave phone message Yes  Some recent data might be hidden     Patient questions:  Do you have a fever, pain , or abdominal swelling? No. Pain Score  0 *  Have you tolerated food without any problems? Yes.    Have you been able to return to your normal activities? Yes.    Do you have any questions about your discharge instructions: Diet   No. Medications  No. Follow up visit  No.  Do you have questions or concerns about your Care? No.  Actions: * If pain score is 4 or above: No action needed, pain <4.

## 2016-02-18 ENCOUNTER — Encounter: Payer: Self-pay | Admitting: Gastroenterology

## 2016-03-01 ENCOUNTER — Other Ambulatory Visit: Payer: Self-pay | Admitting: Family Medicine

## 2016-03-18 ENCOUNTER — Ambulatory Visit: Payer: BC Managed Care – PPO

## 2016-03-19 ENCOUNTER — Ambulatory Visit
Admission: RE | Admit: 2016-03-19 | Discharge: 2016-03-19 | Disposition: A | Discharge status: Home / Self Care | Payer: BC Managed Care – PPO | Source: Ambulatory Visit | Attending: Family Medicine | Admitting: Family Medicine

## 2016-03-19 DIAGNOSIS — Z1231 Encounter for screening mammogram for malignant neoplasm of breast: Secondary | ICD-10-CM

## 2016-05-16 ENCOUNTER — Other Ambulatory Visit: Payer: Self-pay | Admitting: Family Medicine

## 2016-06-16 ENCOUNTER — Telehealth: Payer: Self-pay | Admitting: Family Medicine

## 2016-06-16 NOTE — Telephone Encounter (Signed)
° ° °  Pt call to request a rx for the new shingles vaccine. Pt said her pharmacy [facility] will give her the injection   [phone]

## 2016-06-17 NOTE — Telephone Encounter (Signed)
I called the pt and informed her of the message below and she is aware I will contact her when our office has approval to give the actual vaccine.

## 2016-06-17 NOTE — Telephone Encounter (Signed)
We have not yet had approval to rx this vaccine. When we do we will start offering it.  Thanks.

## 2016-07-08 ENCOUNTER — Other Ambulatory Visit: Payer: Self-pay | Admitting: Family Medicine

## 2016-08-23 ENCOUNTER — Telehealth: Payer: Self-pay | Admitting: Family Medicine

## 2016-08-23 NOTE — Telephone Encounter (Signed)
Pt would like you to know she made her cpe. 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

## 2016-08-27 ENCOUNTER — Other Ambulatory Visit: Payer: Self-pay | Admitting: Family Medicine

## 2016-09-29 ENCOUNTER — Telehealth: Payer: Self-pay | Admitting: Family Medicine

## 2016-09-29 NOTE — Telephone Encounter (Signed)
Patient would like to get a RX for the new shingles vaccination. Patient states that the pharmacy said that she needs a RX from the dr. Patient does not know the name of the new vaccination.  Pharmacy: [facility]: [phone]

## 2016-09-30 ENCOUNTER — Telehealth: Payer: Self-pay | Admitting: *Deleted

## 2016-09-30 NOTE — Telephone Encounter (Signed)
I think this is done? Thanks.

## 2016-09-30 NOTE — Telephone Encounter (Signed)
[facility] faxed a note stating the pt would like an Rx for Shingrix. Per Dr Maudie Mercury I called [facility] and gave a verbal order for the Shingrix (new shingles vaccine).  Order was given for Shingrix #2- one injection now and repeat in 2-6 months.

## 2016-10-01 NOTE — Telephone Encounter (Signed)
See prior note

## 2016-12-30 ENCOUNTER — Encounter: Payer: BC Managed Care – PPO | Admitting: Family Medicine

## 2017-01-17 NOTE — Progress Notes (Signed)
HPI:  Here for CPE: Due for labs, hep C screening, PPSV 23.  -Concerns and/or follow up today:  Not in for over 1 year - hx poor compliance. Hx HTN, obesity and OA of the knees. Admits needs to work on diet. Fairly active but could get more exercise. Sees [facility] for gyn exams. Reports sees her yearly and UTD on breast and pelvic exams. Sees dermatologist for skin exams. -Taking folic acid, vitamin D or calcium: no  -Diabetes and Dyslipidemia Screening: fasting for labs  -Vaccines: UTD  -pap history: sees gyn  -wants STI testing (Hep C if born [birth year]): no  -FH breast, colon or ovarian ca: see FH Last mammogram: done 03/2016 Last colon cancer screening:done 01/2016 - 5 year repeat advised   -Alcohol, Tobacco, drug use: see social history  Review of Systems - no fevers, unintentional weight loss, vision loss, hearing loss, chest pain, sob, hemoptysis, melena, hematochezia, hematuria, genital discharge, changing or concerning skin lesions, bleeding, bruising, loc, thoughts of self harm or SI  Past Medical History:  Diagnosis Date  . Allergy   . Arthritis   . Colon polyps   . Hypertension     Past Surgical History:  Procedure Laterality Date  . COLONOSCOPY    . orthoscopy  1994  . TONSILLECTOMY    . wisdom teeth extraction  1970    Family History  Problem Relation Age of Onset  . Heart disease Mother   . Alzheimer's disease Mother   . Heart disease Father        CAD, bypass  . Cancer Maternal Grandfather 26       colon cancer  . Colon cancer Maternal Grandfather   . Esophageal cancer Neg Hx   . Stomach cancer Neg Hx   . Rectal cancer Neg Hx     Social History   Social History  . Marital status: Married    Spouse name: N/A  . Number of children: N/A  . Years of education: N/A   Occupational History  . language facilitator    Social History Main Topics  . Smoking status: Former Smoker    Types: Cigarettes    Quit date: 01/04/1979  . Smokeless  tobacco: Never Used  . Alcohol use Yes     Comment: daily  . Drug use: No  . Sexual activity: Yes   Other Topics Concern  . 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  BIOTIN PO, Take 1 tablet by mouth daily., Disp: , Rfl:  .  Cholecalciferol (VITAMIN D PO), Take by mouth daily., Disp: , Rfl:  .  fexofenadine (ALLEGRA) 60 MG tablet, Take 60 mg by mouth daily., Disp: , Rfl:  .  fluticasone (FLONASE) 50 MCG/ACT nasal spray, USE 2 SPRAYS IN EACH NOSTRIL ONCE DAILY, Disp: 16 g, Rfl: 3 .  losartan (COZAAR) 50 MG tablet, Take 1 tablet (50 mg total) by mouth daily., Disp: 90 tablet, Rfl: 3 .  LUTEIN PO, Take by mouth daily., Disp: , Rfl:  .  Pyridoxine HCl (VITAMIN B-6 PO), Take by mouth daily., Disp: , Rfl:   EXAM:  Vitals:   01/18/17 5993  BP: 128/80  Pulse: 72  Temp: 97.4 F (36.3 C)  Body mass index is 35.13 kg/m.   GENERAL: vitals reviewed and listed below, alert, oriented, appears well hydrated and in no acute distress  HEENT: head atraumatic, PERRLA, normal appearance of eyes, ears, nose and mouth. moist mucus membranes.  NECK: supple, no masses or lymphadenopathy  LUNGS: clear to auscultation bilaterally, no rales, rhonchi or wheeze  CV: HRRR, no peripheral edema or cyanosis, normal pedal pulses  ABDOMEN: bowel sounds normal, soft, non tender to palpation, no masses, no rebound or guarding  GU/BREAST: declined, sees gyn  SKIN: no rash or abnormal lesions, declined full skin exam - sees dermatologist  MS: normal gait, moves all extremities normally  NEURO: normal gait, speech and thought processing grossly intact, muscle tone grossly intact throughout  PSYCH: normal affect, pleasant and cooperative  ASSESSMENT AND  PLAN:  Discussed the following assessment and plan:  Encounter for preventive health examination - Plan: Hemoglobin A1c, Lipid panel  Essential hypertension - Plan: Basic metabolic panel, CBC  BMI 94.7-09.6,GEZMO  -Discussed and advised all [place] preventive services health task force level A and B recommendations for age, sex and risks.  -Advised at least 150 minutes of exercise per week and a healthy diet with avoidance of (less then 1 serving per week) processed foods, white starches, red meat, fast foods and sweets and consisting of: * 5-9 servings of fresh fruits and vegetables (not corn or potatoes) *nuts and seeds, beans *olives and olive oil *lean meats such as fish and white chicken  *whole grains  -FASTING labs, studies and vaccines per orders this encounter  Orders Placed This Encounter  Procedures  . Basic metabolic panel  . CBC  . Hemoglobin A1c  . Lipid panel    Patient advised to return to clinic immediately if symptoms worsen or persist or new concerns.  Patient Instructions  BEFORE YOU LEAVE: -Pneumococcal vaccine PPSV23 -has she had bone density test in last 3-5 years? Order if she wishes. -follow up: 4-6 months -labs  We have ordered labs or studies at this visit. It can take up to 1-2 weeks for results and processing. IF results require follow up or explanation, we will call you with instructions. Clinically stable results will be released to your [facility]. If you have not heard from [place] or cannot find your results in [place] in 2 weeks please contact our office at [phone].  If you are not yet signed up for [facility], please consider signing up.   We recommend the following healthy lifestyle for LIFE: 1) Small portions.   Tip: eat off of a salad plate instead of a dinner plate.  Tip: It is ok to feel hungry after a meal - that likely means you ate an appropriate portion.  Tip: if you need more or a snack choose fruits, veggies and/or a handful of nuts or  seeds.  2) Eat a healthy clean diet.   TRY TO EAT: -at least 5-7 servings of low sugar vegetables per day (not corn, potatoes or bananas.) -berries are the best choice if you wish to eat fruit.   -lean meets (fish, chicken or [place] breasts) -vegan proteins for some meals - beans or tofu, whole grains, nuts and seeds -Replace bad fats with good fats - good fats include: fish, nuts and seeds, canola oil, olive oil -small amounts of low fat or non fat dairy -small amounts of100 % whole grains - check the lables  AVOID: -SUGAR, sweets, anything  with added sugar, corn syrup or sweeteners -if you must have a sweetener, small amounts of stevia may be best -sweetened beverages -simple starches (rice, bread, potatoes, pasta, chips, etc - small amounts of 100% whole grains are ok) -red meat, pork, butter -fried foods, fast food, processed food, excessive dairy, eggs and coconut.  3)Get at least 150 minutes of sweaty aerobic exercise per week.  4)Reduce stress - consider counseling, meditation and relaxation to balance other aspects of your life.   Health Maintenance for Postmenopausal Women Menopause is a normal process in which your reproductive ability comes to an end. This process happens gradually over a span of months to years, usually between the ages of 47 and 42. Menopause is complete when you have missed 12 consecutive menstrual periods. It is important to talk with your health care provider about some of the most common conditions that affect postmenopausal women, such as heart disease, cancer, and bone loss (osteoporosis). Adopting a healthy lifestyle and getting preventive care can help to promote your health and wellness. Those actions can also lower your chances of developing some of these common conditions. What should I know about menopause? During menopause, you may experience a number of symptoms, such as:  Moderate-to-severe hot flashes.  Night sweats.  Decrease in sex  drive.  Mood swings.  Headaches.  Tiredness.  Irritability.  Memory problems.  Insomnia.  Choosing to treat or not to treat menopausal changes is an individual decision that you make with your health care provider. What should I know about hormone replacement therapy and supplements? Hormone therapy products are effective for treating symptoms that are associated with menopause, such as hot flashes and night sweats. Hormone replacement carries certain risks, especially as you become older. If you are thinking about using estrogen or estrogen with progestin treatments, discuss the benefits and risks with your health care provider. What should I know about heart disease and stroke? Heart disease, heart attack, and stroke become more likely as you age. This may be due, in part, to the hormonal changes that your body experiences during menopause. These can affect how your body processes dietary fats, triglycerides, and cholesterol. Heart attack and stroke are both medical emergencies. There are many things that you can do to help prevent heart disease and stroke:  Have your blood pressure checked at least every 1-2 years. High blood pressure causes heart disease and increases the risk of stroke.  If you are 79-35 years old, ask your health care provider if you should take aspirin to prevent a heart attack or a stroke.  Do not use any tobacco products, including cigarettes, chewing tobacco, or electronic cigarettes. If you need help quitting, ask your health care provider.  It is important to eat a healthy diet and maintain a healthy weight.  Get regular exercise. This is one of the most important things that you can do for your health. ? Try to exercise for at least 150 minutes each week. The type of exercise that you do should increase your heart rate and make you sweat. This is known as moderate-intensity exercise. ? Try to do strengthening exercises at least twice each week. Do these in  addition to the moderate-intensity exercise.  Know your numbers.Ask your health care provider to check your cholesterol and your blood glucose. Continue to have your blood tested as directed by your health care provider.  What should I know about cancer screening? There are several types of cancer. Take the following steps to reduce  your risk and to catch any cancer development as early as possible. Breast Cancer  Practice breast self-awareness. ? This means understanding how your breasts normally appear and feel. ? It also means doing regular breast self-exams. Let your health care provider know about any changes, no matter how small.  If you are 52 or older, have a clinician do a breast exam (clinical breast exam or CBE) every year. Depending on your age, family history, and medical history, it may be recommended that you also have a yearly breast X-ray (mammogram).  If you have a family history of breast cancer, talk with your health care provider about genetic screening.  If you are at high risk for breast cancer, talk with your health care provider about having an MRI and a mammogram every year.  Breast cancer (BRCA) gene test is recommended for women who have family members with BRCA-related cancers. Results of the assessment will determine the need for genetic counseling and BRCA1 and for BRCA2 testing. BRCA-related cancers include these types: ? Breast. This occurs in males or females. ? Ovarian. ? Tubal. This may also be called fallopian tube cancer. ? Cancer of the abdominal or pelvic lining (peritoneal cancer). ? Prostate. ? Pancreatic.  Cervical, Uterine, and Ovarian Cancer Your health care provider may recommend that you be screened regularly for cancer of the pelvic organs. These include your ovaries, uterus, and vagina. This screening involves a pelvic exam, which includes checking for microscopic changes to the surface of your cervix (Pap test).  For women ages 21-65,  health care providers may recommend a pelvic exam and a Pap test every three years. For women ages 52-65, they may recommend the Pap test and pelvic exam, combined with testing for human papilloma virus (HPV), every five years. Some types of HPV increase your risk of cervical cancer. Testing for HPV may also be done on women of any age who have unclear Pap test results.  Other health care providers may not recommend any screening for nonpregnant women who are considered low risk for pelvic cancer and have no symptoms. Ask your health care provider if a screening pelvic exam is right for you.  If you have had past treatment for cervical cancer or a condition that could lead to cancer, you need Pap tests and screening for cancer for at least 20 years after your treatment. If Pap tests have been discontinued for you, your risk factors (such as having a new sexual partner) need to be reassessed to determine if you should start having screenings again. Some women have medical problems that increase the chance of getting cervical cancer. In these cases, your health care provider may recommend that you have screening and Pap tests more often.  If you have a family history of uterine cancer or ovarian cancer, talk with your health care provider about genetic screening.  If you have vaginal bleeding after reaching menopause, tell your health care provider.  There are currently no reliable tests available to screen for ovarian cancer.  Lung Cancer Lung cancer screening is recommended for adults 26-41 years old who are at high risk for lung cancer because of a history of smoking. A yearly low-dose CT scan of the lungs is recommended if you:  Currently smoke.  Have a history of at least 30 pack-years of smoking and you currently smoke or have quit within the past 15 years. A pack-year is smoking an average of one pack of cigarettes per day for one year.  Yearly  screening should:  Continue until it has been  15 years since you quit.  Stop if you develop a health problem that would prevent you from having lung cancer treatment.  Colorectal Cancer  This type of cancer can be detected and can often be prevented.  Routine colorectal cancer screening usually begins at age 18 and continues through age 54.  If you have risk factors for colon cancer, your health care provider may recommend that you be screened at an earlier age.  If you have a family history of colorectal cancer, talk with your health care provider about genetic screening.  Your health care provider may also recommend using home test kits to check for hidden blood in your stool.  A small camera at the end of a tube can be used to examine your colon directly (sigmoidoscopy or colonoscopy). This is done to check for the earliest forms of colorectal cancer.  Direct examination of the colon should be repeated every 5-10 years until age 36. However, if early forms of precancerous polyps or small growths are found or if you have a family history or genetic risk for colorectal cancer, you may need to be screened more often.  Skin Cancer  Check your skin from head to toe regularly.  Monitor any moles. Be sure to tell your health care provider: ? About any new moles or changes in moles, especially if there is a change in a mole's shape or color. ? If you have a mole that is larger than the size of a pencil eraser.  If any of your family members has a history of skin cancer, especially at a young age, talk with your health care provider about genetic screening.  Always use sunscreen. Apply sunscreen liberally and repeatedly throughout the day.  Whenever you are outside, protect yourself by wearing long sleeves, pants, a wide-brimmed hat, and sunglasses.  What should I know about osteoporosis? Osteoporosis is a condition in which bone destruction happens more quickly than new bone creation. After menopause, you may be at an increased  risk for osteoporosis. To help prevent osteoporosis or the bone fractures that can happen because of osteoporosis, the following is recommended:  If you are 83-37 years old, get at least 1,000 mg of calcium and at least 600 mg of vitamin D per day.  If you are older than age 23 but younger than age 12, get at least 1,200 mg of calcium and at least 600 mg of vitamin D per day.  If you are older than age 57, get at least 1,200 mg of calcium and at least 800 mg of vitamin D per day.  Smoking and excessive alcohol intake increase the risk of osteoporosis. Eat foods that are rich in calcium and vitamin D, and do weight-bearing exercises several times each week as directed by your health care provider. What should I know about how menopause affects my mental health? Depression may occur at any age, but it is more common as you become older. Common symptoms of depression include:  Low or sad mood.  Changes in sleep patterns.  Changes in appetite or eating patterns.  Feeling an overall lack of motivation or enjoyment of activities that you previously enjoyed.  Frequent crying spells.  Talk with your health care provider if you think that you are experiencing depression. What should I know about immunizations? It is important that you get and maintain your immunizations. These include:  Tetanus, diphtheria, and pertussis (Tdap) booster vaccine.  Influenza every  year before the flu season begins.  Pneumonia vaccine.  Shingles vaccine.  Your health care provider may also recommend other immunizations. This information is not intended to replace advice given to you by your health care provider. Make sure you discuss any questions you have with your health care provider. Document Released: 08/27/2005 Document Revised: 01/23/2016 Document Reviewed: 04/08/2015 Elsevier Interactive Patient Education  2018 [employer].             No Follow-up on file.  Colin Benton R., DO

## 2017-01-18 ENCOUNTER — Ambulatory Visit (INDEPENDENT_AMBULATORY_CARE_PROVIDER_SITE_OTHER): Payer: BC Managed Care – PPO | Admitting: Family Medicine

## 2017-01-18 ENCOUNTER — Encounter: Payer: Self-pay | Admitting: Family Medicine

## 2017-01-18 VITALS — BP 128/80 | HR 72 | Temp 97.4°F | Ht 65.0 in | Wt 211.1 lb

## 2017-01-18 DIAGNOSIS — I10 Essential (primary) hypertension: Secondary | ICD-10-CM | POA: Diagnosis not present

## 2017-01-18 DIAGNOSIS — Z0000 Encounter for general adult medical examination without abnormal findings: Secondary | ICD-10-CM | POA: Diagnosis not present

## 2017-01-18 DIAGNOSIS — Z23 Encounter for immunization: Secondary | ICD-10-CM | POA: Diagnosis not present

## 2017-01-18 DIAGNOSIS — Z6835 Body mass index (BMI) 35.0-35.9, adult: Secondary | ICD-10-CM | POA: Diagnosis not present

## 2017-01-18 DIAGNOSIS — E2839 Other primary ovarian failure: Secondary | ICD-10-CM

## 2017-01-18 LAB — LIPID PANEL
CHOL/HDL RATIO: 3
Cholesterol: 212 mg/dL — ABNORMAL HIGH (ref 0–200)
HDL: 63.7 mg/dL (ref >39.00)
LDL Cholesterol: 131 mg/dL — ABNORMAL HIGH (ref 0–99)
NONHDL: 148.11
Triglycerides: 84 mg/dL (ref 0.0–149.0)
VLDL: 16.8 mg/dL (ref 0.0–40.0)

## 2017-01-18 LAB — CBC
HEMATOCRIT: 43.3 % (ref 36.0–46.0)
HEMOGLOBIN: 14.8 g/dL (ref 12.0–15.0)
MCHC: 34.1 g/dL (ref 30.0–36.0)
MCV: 92.6 fl (ref 78.0–100.0)
Platelets: 313 10*3/uL (ref 150.0–400.0)
RBC: 4.68 Mil/uL (ref 3.87–5.11)
RDW: 13.6 % (ref 11.5–15.5)
WBC: 6.3 10*3/uL (ref 4.0–10.5)

## 2017-01-18 LAB — BASIC METABOLIC PANEL
BUN: 13 mg/dL (ref 6–23)
CALCIUM: 9.1 mg/dL (ref 8.4–10.5)
CHLORIDE: 104 meq/L (ref 96–112)
CO2: 26 meq/L (ref 19–32)
CREATININE: 0.76 mg/dL (ref 0.40–1.20)
GFR: 80.56 mL/min (ref >60.00)
Glucose, Bld: 116 mg/dL — ABNORMAL HIGH (ref 70–99)
Potassium: 4.2 mEq/L (ref 3.5–5.1)
Sodium: 138 mEq/L (ref 135–145)

## 2017-01-18 LAB — HEMOGLOBIN A1C: Hgb A1c MFr Bld: 5.7 % (ref 4.6–6.5)

## 2017-01-18 MED ORDER — LOSARTAN POTASSIUM 50 MG PO TABS: 50.0000 mg | ORAL_TABLET | Freq: Every day | ORAL | 3 refills | Status: DC

## 2017-01-18 NOTE — Patient Instructions (Signed)
BEFORE YOU LEAVE: -Pneumococcal vaccine PPSV23 -has she had bone density test in last 3-5 years? Order if she wishes. -follow up: 4-6 months -labs  We have ordered labs or studies at this visit. It can take up to 1-2 weeks for results and processing. IF results require follow up or explanation, we will call you with instructions. Clinically stable results will be released to your [facility]. If you have not heard from [place] or cannot find your results in [facility] in 2 weeks please contact our office at [phone].  If you are not yet signed up for [facility], please consider signing up.   We recommend the following healthy lifestyle for LIFE: 1) Small portions.   Tip: eat off of a salad plate instead of a dinner plate.  Tip: It is ok to feel hungry after a meal - that likely means you ate an appropriate portion.  Tip: if you need more or a snack choose fruits, veggies and/or a handful of nuts or seeds.  2) Eat a healthy clean diet.   TRY TO EAT: -at least 5-7 servings of low sugar vegetables per day (not corn, potatoes or bananas.) -berries are the best choice if you wish to eat fruit.   -lean meets (fish, chicken or [place] breasts) -vegan proteins for some meals - beans or tofu, whole grains, nuts and seeds -Replace bad fats with good fats - good fats include: fish, nuts and seeds, canola oil, olive oil -small amounts of low fat or non fat dairy -small amounts of100 % whole grains - check the lables  AVOID: -SUGAR, sweets, anything with added sugar, corn syrup or sweeteners -if you must have a sweetener, small amounts of stevia may be best -sweetened beverages -simple starches (rice, bread, potatoes, pasta, chips, etc - small amounts of 100% whole grains are ok) -red meat, pork, butter -fried foods, fast food, processed food, excessive dairy, eggs and coconut.  3)Get at least 150 minutes of sweaty aerobic exercise per week.  4)Reduce stress - consider counseling, meditation and  relaxation to balance other aspects of your life.   Health Maintenance for Postmenopausal Women Menopause is a normal process in which your reproductive ability comes to an end. This process happens gradually over a span of months to years, usually between the ages of 66 and 79. Menopause is complete when you have missed 12 consecutive menstrual periods. It is important to talk with your health care provider about some of the most common conditions that affect postmenopausal women, such as heart disease, cancer, and bone loss (osteoporosis). Adopting a healthy lifestyle and getting preventive care can help to promote your health and wellness. Those actions can also lower your chances of developing some of these common conditions. What should I know about menopause? During menopause, you may experience a number of symptoms, such as:  Moderate-to-severe hot flashes.  Night sweats.  Decrease in sex drive.  Mood swings.  Headaches.  Tiredness.  Irritability.  Memory problems.  Insomnia.  Choosing to treat or not to treat menopausal changes is an individual decision that you make with your health care provider. What should I know about hormone replacement therapy and supplements? Hormone therapy products are effective for treating symptoms that are associated with menopause, such as hot flashes and night sweats. Hormone replacement carries certain risks, especially as you become older. If you are thinking about using estrogen or estrogen with progestin treatments, discuss the benefits and risks with your health care provider. What should I know about  heart disease and stroke? Heart disease, heart attack, and stroke become more likely as you age. This may be due, in part, to the hormonal changes that your body experiences during menopause. These can affect how your body processes dietary fats, triglycerides, and cholesterol. Heart attack and stroke are both medical emergencies. There are  many things that you can do to help prevent heart disease and stroke:  Have your blood pressure checked at least every 1-2 years. High blood pressure causes heart disease and increases the risk of stroke.  If you are 101-56 years old, ask your health care provider if you should take aspirin to prevent a heart attack or a stroke.  Do not use any tobacco products, including cigarettes, chewing tobacco, or electronic cigarettes. If you need help quitting, ask your health care provider.  It is important to eat a healthy diet and maintain a healthy weight.  Get regular exercise. This is one of the most important things that you can do for your health. ? Try to exercise for at least 150 minutes each week. The type of exercise that you do should increase your heart rate and make you sweat. This is known as moderate-intensity exercise. ? Try to do strengthening exercises at least twice each week. Do these in addition to the moderate-intensity exercise.  Know your numbers.Ask your health care provider to check your cholesterol and your blood glucose. Continue to have your blood tested as directed by your health care provider.  What should I know about cancer screening? There are several types of cancer. Take the following steps to reduce your risk and to catch any cancer development as early as possible. Breast Cancer  Practice breast self-awareness. ? This means understanding how your breasts normally appear and feel. ? It also means doing regular breast self-exams. Let your health care provider know about any changes, no matter how small.  If you are 28 or older, have a clinician do a breast exam (clinical breast exam or CBE) every year. Depending on your age, family history, and medical history, it may be recommended that you also have a yearly breast X-ray (mammogram).  If you have a family history of breast cancer, talk with your health care provider about genetic screening.  If you are at  high risk for breast cancer, talk with your health care provider about having an MRI and a mammogram every year.  Breast cancer (BRCA) gene test is recommended for women who have family members with BRCA-related cancers. Results of the assessment will determine the need for genetic counseling and BRCA1 and for BRCA2 testing. BRCA-related cancers include these types: ? Breast. This occurs in males or females. ? Ovarian. ? Tubal. This may also be called fallopian tube cancer. ? Cancer of the abdominal or pelvic lining (peritoneal cancer). ? Prostate. ? Pancreatic.  Cervical, Uterine, and Ovarian Cancer Your health care provider may recommend that you be screened regularly for cancer of the pelvic organs. These include your ovaries, uterus, and vagina. This screening involves a pelvic exam, which includes checking for microscopic changes to the surface of your cervix (Pap test).  For women ages 21-65, health care providers may recommend a pelvic exam and a Pap test every three years. For women ages 70-65, they may recommend the Pap test and pelvic exam, combined with testing for human papilloma virus (HPV), every five years. Some types of HPV increase your risk of cervical cancer. Testing for HPV may also be done on women of any  age who have unclear Pap test results.  Other health care providers may not recommend any screening for nonpregnant women who are considered low risk for pelvic cancer and have no symptoms. Ask your health care provider if a screening pelvic exam is right for you.  If you have had past treatment for cervical cancer or a condition that could lead to cancer, you need Pap tests and screening for cancer for at least 20 years after your treatment. If Pap tests have been discontinued for you, your risk factors (such as having a new sexual partner) need to be reassessed to determine if you should start having screenings again. Some women have medical problems that increase the chance  of getting cervical cancer. In these cases, your health care provider may recommend that you have screening and Pap tests more often.  If you have a family history of uterine cancer or ovarian cancer, talk with your health care provider about genetic screening.  If you have vaginal bleeding after reaching menopause, tell your health care provider.  There are currently no reliable tests available to screen for ovarian cancer.  Lung Cancer Lung cancer screening is recommended for adults 67-59 years old who are at high risk for lung cancer because of a history of smoking. A yearly low-dose CT scan of the lungs is recommended if you:  Currently smoke.  Have a history of at least 30 pack-years of smoking and you currently smoke or have quit within the past 15 years. A pack-year is smoking an average of one pack of cigarettes per day for one year.  Yearly screening should:  Continue until it has been 15 years since you quit.  Stop if you develop a health problem that would prevent you from having lung cancer treatment.  Colorectal Cancer  This type of cancer can be detected and can often be prevented.  Routine colorectal cancer screening usually begins at age 84 and continues through age 38.  If you have risk factors for colon cancer, your health care provider may recommend that you be screened at an earlier age.  If you have a family history of colorectal cancer, talk with your health care provider about genetic screening.  Your health care provider may also recommend using home test kits to check for hidden blood in your stool.  A small camera at the end of a tube can be used to examine your colon directly (sigmoidoscopy or colonoscopy). This is done to check for the earliest forms of colorectal cancer.  Direct examination of the colon should be repeated every 5-10 years until age 70. However, if early forms of precancerous polyps or small growths are found or if you have a family  history or genetic risk for colorectal cancer, you may need to be screened more often.  Skin Cancer  Check your skin from head to toe regularly.  Monitor any moles. Be sure to tell your health care provider: ? About any new moles or changes in moles, especially if there is a change in a mole's shape or color. ? If you have a mole that is larger than the size of a pencil eraser.  If any of your family members has a history of skin cancer, especially at a young age, talk with your health care provider about genetic screening.  Always use sunscreen. Apply sunscreen liberally and repeatedly throughout the day.  Whenever you are outside, protect yourself by wearing long sleeves, pants, a wide-brimmed hat, and sunglasses.  What should  I know about osteoporosis? Osteoporosis is a condition in which bone destruction happens more quickly than new bone creation. After menopause, you may be at an increased risk for osteoporosis. To help prevent osteoporosis or the bone fractures that can happen because of osteoporosis, the following is recommended:  If you are 62-18 years old, get at least 1,000 mg of calcium and at least 600 mg of vitamin D per day.  If you are older than age 81 but younger than age 31, get at least 1,200 mg of calcium and at least 600 mg of vitamin D per day.  If you are older than age 46, get at least 1,200 mg of calcium and at least 800 mg of vitamin D per day.  Smoking and excessive alcohol intake increase the risk of osteoporosis. Eat foods that are rich in calcium and vitamin D, and do weight-bearing exercises several times each week as directed by your health care provider. What should I know about how menopause affects my mental health? Depression may occur at any age, but it is more common as you become older. Common symptoms of depression include:  Low or sad mood.  Changes in sleep patterns.  Changes in appetite or eating patterns.  Feeling an overall lack of  motivation or enjoyment of activities that you previously enjoyed.  Frequent crying spells.  Talk with your health care provider if you think that you are experiencing depression. What should I know about immunizations? It is important that you get and maintain your immunizations. These include:  Tetanus, diphtheria, and pertussis (Tdap) booster vaccine.  Influenza every year before the flu season begins.  Pneumonia vaccine.  Shingles vaccine.  Your health care provider may also recommend other immunizations. This information is not intended to replace advice given to you by your health care provider. Make sure you discuss any questions you have with your health care provider. Document Released: 08/27/2005 Document Revised: 01/23/2016 Document Reviewed: 04/08/2015 Elsevier Interactive Patient Education  2018 [employer].

## 2017-01-18 NOTE — Addendum Note (Signed)
Addended by: Agnes Lawrence on: 01/18/2017 10:27 AM   Modules accepted: Orders

## 2017-01-18 NOTE — Addendum Note (Signed)
Addended by: Agnes Lawrence on: 01/18/2017 10:33 AM   Modules accepted: Orders

## 2017-01-24 ENCOUNTER — Ambulatory Visit
Admission: RE | Admit: 2017-01-24 | Discharge: 2017-01-24 | Disposition: A | Discharge status: Home / Self Care | Payer: BC Managed Care – PPO | Source: Ambulatory Visit | Attending: Family Medicine | Admitting: Family Medicine

## 2017-01-24 DIAGNOSIS — E2839 Other primary ovarian failure: Secondary | ICD-10-CM

## 2017-02-07 ENCOUNTER — Other Ambulatory Visit: Payer: Self-pay | Admitting: Family Medicine

## 2017-02-07 DIAGNOSIS — Z1231 Encounter for screening mammogram for malignant neoplasm of breast: Secondary | ICD-10-CM

## 2017-03-22 ENCOUNTER — Ambulatory Visit: Payer: BC Managed Care – PPO

## 2017-04-04 ENCOUNTER — Ambulatory Visit
Admission: RE | Admit: 2017-04-04 | Discharge: 2017-04-04 | Disposition: A | Discharge status: Home / Self Care | Payer: BC Managed Care – PPO | Source: Ambulatory Visit | Attending: Family Medicine | Admitting: Family Medicine

## 2017-04-04 DIAGNOSIS — Z1231 Encounter for screening mammogram for malignant neoplasm of breast: Secondary | ICD-10-CM

## 2017-04-06 ENCOUNTER — Ambulatory Visit: Payer: BC Managed Care – PPO

## 2017-07-22 ENCOUNTER — Ambulatory Visit: Payer: BC Managed Care – PPO | Admitting: Family Medicine

## 2018-01-23 ENCOUNTER — Encounter: Payer: BC Managed Care – PPO | Admitting: Family Medicine

## 2018-02-22 ENCOUNTER — Other Ambulatory Visit: Payer: Self-pay | Admitting: Family Medicine

## 2018-02-22 DIAGNOSIS — Z1231 Encounter for screening mammogram for malignant neoplasm of breast: Secondary | ICD-10-CM

## 2018-03-02 ENCOUNTER — Encounter: Payer: BC Managed Care – PPO | Admitting: Family Medicine

## 2018-03-26 ENCOUNTER — Other Ambulatory Visit: Payer: Self-pay | Admitting: Family Medicine

## 2018-03-27 ENCOUNTER — Other Ambulatory Visit: Payer: Self-pay | Admitting: *Deleted

## 2018-03-27 MED ORDER — LOSARTAN POTASSIUM 50 MG PO TABS: 50.0000 mg | ORAL_TABLET | Freq: Every day | ORAL | 0 refills | Status: DC

## 2018-03-27 NOTE — Telephone Encounter (Signed)
Rx done. 

## 2018-04-06 ENCOUNTER — Ambulatory Visit
Admission: RE | Admit: 2018-04-06 | Discharge: 2018-04-06 | Disposition: A | Discharge status: Home / Self Care | Payer: BC Managed Care – PPO | Source: Ambulatory Visit | Attending: Family Medicine | Admitting: Family Medicine

## 2018-04-06 DIAGNOSIS — Z1231 Encounter for screening mammogram for malignant neoplasm of breast: Secondary | ICD-10-CM

## 2018-05-05 ENCOUNTER — Ambulatory Visit (INDEPENDENT_AMBULATORY_CARE_PROVIDER_SITE_OTHER): Payer: BC Managed Care – PPO

## 2018-05-05 DIAGNOSIS — Z23 Encounter for immunization: Secondary | ICD-10-CM | POA: Diagnosis not present

## 2018-05-26 ENCOUNTER — Other Ambulatory Visit: Payer: Self-pay

## 2018-05-26 ENCOUNTER — Emergency Department (HOSPITAL_COMMUNITY)
Admission: EM | Admit: 2018-05-26 | Discharge: 2018-05-26 | Disposition: A | Discharge status: Home / Self Care | Payer: BC Managed Care – PPO | Attending: Emergency Medicine | Admitting: Emergency Medicine

## 2018-05-26 ENCOUNTER — Emergency Department (HOSPITAL_COMMUNITY): Payer: BC Managed Care – PPO

## 2018-05-26 ENCOUNTER — Encounter (HOSPITAL_COMMUNITY): Payer: Self-pay | Admitting: Emergency Medicine

## 2018-05-26 DIAGNOSIS — I10 Essential (primary) hypertension: Secondary | ICD-10-CM | POA: Insufficient documentation

## 2018-05-26 DIAGNOSIS — R0789 Other chest pain: Secondary | ICD-10-CM | POA: Insufficient documentation

## 2018-05-26 DIAGNOSIS — R109 Unspecified abdominal pain: Secondary | ICD-10-CM | POA: Diagnosis not present

## 2018-05-26 DIAGNOSIS — Z87891 Personal history of nicotine dependence: Secondary | ICD-10-CM | POA: Diagnosis not present

## 2018-05-26 DIAGNOSIS — R079 Chest pain, unspecified: Secondary | ICD-10-CM

## 2018-05-26 LAB — URINALYSIS, ROUTINE W REFLEX MICROSCOPIC
BILIRUBIN URINE: NEGATIVE
GLUCOSE, UA: NEGATIVE mg/dL
HGB URINE DIPSTICK: NEGATIVE
KETONES UR: NEGATIVE mg/dL
Nitrite: NEGATIVE
Protein, ur: NEGATIVE mg/dL
Specific Gravity, Urine: 1.025 (ref 1.005–1.030)
pH: 6 (ref 5.0–8.0)

## 2018-05-26 LAB — COMPREHENSIVE METABOLIC PANEL
ALT: 30 U/L (ref 0–44)
ANION GAP: 7 (ref 5–15)
AST: 61 U/L — AB (ref 15–41)
Albumin: 3.8 g/dL (ref 3.5–5.0)
Alkaline Phosphatase: 68 U/L (ref 38–126)
BUN: 17 mg/dL (ref 8–23)
CHLORIDE: 106 mmol/L (ref 98–111)
CO2: 25 mmol/L (ref 22–32)
Calcium: 8.9 mg/dL (ref 8.9–10.3)
Creatinine, Ser: 0.72 mg/dL (ref 0.44–1.00)
GFR calc Af Amer: >60 mL/min (ref >60)
Glucose, Bld: 107 mg/dL — ABNORMAL HIGH (ref 70–99)
POTASSIUM: 3.7 mmol/L (ref 3.5–5.1)
Sodium: 138 mmol/L (ref 135–145)
TOTAL PROTEIN: 7 g/dL (ref 6.5–8.1)
Total Bilirubin: 0.8 mg/dL (ref 0.3–1.2)

## 2018-05-26 LAB — CBC
HEMATOCRIT: 44.2 % (ref 36.0–46.0)
HEMOGLOBIN: 14.2 g/dL (ref 12.0–15.0)
MCH: 30.3 pg (ref 26.0–34.0)
MCHC: 32.1 g/dL (ref 30.0–36.0)
MCV: 94.4 fL (ref 80.0–100.0)
Platelets: 293 10*3/uL (ref 150–400)
RBC: 4.68 MIL/uL (ref 3.87–5.11)
RDW: 13.2 % (ref 11.5–15.5)
WBC: 5.9 10*3/uL (ref 4.0–10.5)
nRBC: 0 % (ref 0.0–0.2)

## 2018-05-26 LAB — LIPASE, BLOOD: LIPASE: 32 U/L (ref 11–51)

## 2018-05-26 LAB — TROPONIN I: Troponin I: <0.03 ng/mL (ref <0.03)

## 2018-05-26 LAB — CBG MONITORING, ED: GLUCOSE-CAPILLARY: 128 mg/dL — AB (ref 70–99)

## 2018-05-26 MED ORDER — ASPIRIN 81 MG PO CHEW
81.0000 mg | CHEWABLE_TABLET | Freq: Once | ORAL | Status: AC
  Administered 2018-05-26: 81 mg via ORAL
  Filled 2018-05-26: qty 1

## 2018-05-26 NOTE — ED Provider Notes (Signed)
Emergency Department Provider Note   I have reviewed the triage vital signs and the nursing notes.   HISTORY  Chief Complaint Chest Pain   HPI Melinda Ferguson is a 67 y.o. female with a history of hypertension distant history of smoking who presents the emergency department today with abdominal pain and chest pain.  Patient states that she was having a normal bowel movement when she started having a spasm type pain in her abdomen and chest.  She is never anything like this before in her chest but has had in her abdomen.  She describes as feeling kind of like a contraction but a little bit different like across between the contraction and a muscle spasm.  Not pressure or tightness.  She had no associated shortness of breath, nausea, vomiting or lightheadedness.  She states she had a little bit of diaphoresis on her neck but not anywhere else.  This persisted so she took 3 baby aspirin and on the way here this discomfort subsided.  She states she has a family history of coronary artery disease her dad who died 2 weeks after a CABG back in the 79s.  No other known coronary artery disease.  No history of stroke for the patient.  No lower extremity swelling, recent surgeries or recent long trips.  No recent illnesses.  No cough, fever or urinary symptoms. No other associated or modifying symptoms.    Past Medical History:  Diagnosis Date  . Allergy   . Arthritis   . Colon polyps   . Hypertension     Patient Active Problem List   Diagnosis Date Noted  . Osteoarthritis of right knee - sees Dr. Wynelle Link 02/19/2014  . HTN (hypertension) 01/01/2013  . Allergic rhinitis 01/01/2013    Past Surgical History:  Procedure Laterality Date  . COLONOSCOPY    . orthoscopy  1994  . TONSILLECTOMY    . wisdom teeth extraction  1970    Current Outpatient Rx  . Order #: [id number] Class: Historical Med  . Order #: [id number] Class: Historical Med  . Order #: [id number] Class: Historical Med  .  Order #: [id number] Class: Historical Med  . Order #: [id number] Class: Normal  . Order #: [id number] Class: Normal  . Order #: [id number] Class: Historical Med  . Order #: [id number] Class: Historical Med  . Order #: [id number] Class: Historical Med  . Order #: [id number] Class: Historical Med    Allergies Patient has no known allergies.  Family History  Problem Relation Age of Onset  . Heart disease Mother   . Alzheimer's disease Mother   . Heart disease Father        CAD, bypass  . Cancer Maternal Grandfather 26       colon cancer  . Colon cancer Maternal Grandfather   . Esophageal cancer Neg Hx   . Stomach cancer Neg Hx   . Rectal cancer Neg Hx     Social History Social History   Tobacco Use  . Smoking status: Former Smoker    Types: Cigarettes    Last attempt to quit: 01/04/1979    Years since quitting: 39.4  . Smokeless tobacco: Never Used  Substance Use Topics  . Alcohol use: Yes    Comment: daily  . Drug use: No    Review of Systems  All other systems negative except as documented in the HPI. All pertinent positives and negatives as reviewed in the HPI. ____________________________________________   PHYSICAL EXAM:  VITAL SIGNS: Blood pressure (!) 149/81, pulse 85, temperature (!) 97.5  F (36.4 C), temperature source Oral, resp. rate 16, last menstrual period 07/19/1997, SpO2 99 %.  Constitutional: Alert and oriented. Well appearing and in no acute distress. Eyes: Conjunctivae are normal. PERRL. EOMI. Head: Atraumatic. Nose: No congestion/rhinnorhea. Mouth/Throat: Mucous membranes are moist.  Oropharynx non-erythematous. Neck: No stridor.  No meningeal signs.   Cardiovascular: Normal rate, regular rhythm. Good peripheral circulation. Grossly normal heart sounds.   Respiratory: Normal respiratory effort.  No retractions. Lungs CTAB. Gastrointestinal: Soft and nontender. No distention.  Musculoskeletal: No lower extremity tenderness nor edema. No gross deformities  of extremities. Neurologic:  Normal speech and language. No gross focal neurologic deficits are appreciated.  Skin:  Skin is warm, dry and intact. No rash noted.   ____________________________________________   LABS (all labs ordered are listed, but only abnormal results are displayed)  Labs Reviewed  COMPREHENSIVE METABOLIC PANEL - Abnormal; Notable for the following components:      Result Value   Glucose, Bld 107 (*)    AST 61 (*)    All other components within normal limits  URINALYSIS, ROUTINE W REFLEX MICROSCOPIC - Abnormal; Notable for the following components:   APPearance CLOUDY (*)    Leukocytes, UA SMALL (*)    Bacteria, UA RARE (*)    All other components within normal limits  CBG MONITORING, ED - Abnormal; Notable for the following components:   Glucose-Capillary 128 (*)    All other components within normal limits  URINE CULTURE  CBC  LIPASE, BLOOD  TROPONIN I   ____________________________________________  EKG   EKG Interpretation  Date/Time:  Friday May 26 2018 07:30:38 EST Ventricular Rate:  76 PR Interval:    QRS Duration: 103 QT Interval:  432 QTC Calculation: 486 R Axis:   4 Text Interpretation:  Sinus rhythm Borderline prolonged QT interval Baseline wander in lead(s) V3 No old tracing to compare Confirmed by Merrily Pew [phone]) on 05/26/2018 7:38:29 AM       ____________________________________________  RADIOLOGY  Dg Chest 2 View  Result Date: 05/26/2018 CLINICAL DATA:  Sudden onset chest pain this morning. EXAM: CHEST - 2 VIEW COMPARISON:  Patient's prior chest x-ray is not available on PACs for comparison. FINDINGS: The heart size and mediastinal contours are within normal limits. Both lungs are clear. Mild degenerative joint changes of the spine are noted. IMPRESSION: No active cardiopulmonary disease. Electronically Signed   By: Abelardo Diesel M.D.   On: 05/26/2018 08:49     ____________________________________________   PROCEDURES  Procedure(s) performed:   Procedures   ____________________________________________   INITIAL IMPRESSION / ASSESSMENT AND PLAN / ED COURSE  With abdominal and chest symptoms consider possible aortic dissection however she is not hypertensive, in extremis or describe a sudden onset of severe pain.  She has no neurologic symptoms or pulse deficits to suggest the same.  If her mediastinum is normal chest x-ray feel like that will sufficient to rule it out. Has some risk factors for ACS but has a very atypical story.  Her heart score is 3-4 range (not sure if she is obese) so is low or low-moderate risk.  I feel like she is stable for delta troponins. Far as other emergent causes for chest pain such as pneumothorax, pneumonia, pericarditis, myocarditis or esophageal rupture think based on history it is unlikely.  Workup ultimately unremarkable. First troponin did not get done secondary to lab issues. Second troponin negative, doubt acs. Symptom free throughout ER stay on monitoring without abnormalities. ecg ok. Stable for dc w/  pcp follow up.     Pertinent labs & imaging results that were available during my care of the patient were reviewed by me and considered in my medical decision making (see chart for details).  ____________________________________________  FINAL CLINICAL IMPRESSION(S) / ED DIAGNOSES  Final diagnoses:  Nonspecific chest pain     MEDICATIONS GIVEN DURING THIS VISIT:  Medications  aspirin chewable tablet 81 mg (81 mg Oral Given 05/26/18 0738)     NEW OUTPATIENT MEDICATIONS STARTED DURING THIS VISIT:  Discharge Medication List as of 05/26/2018 12:28 PM      Note:  This note was prepared with assistance of Dragon voice recognition software. Occasional wrong-word or sound-a-like substitutions may have occurred due to the inherent limitations of voice recognition software.   Merrily Pew,  MD 05/27/18 [phone]

## 2018-05-26 NOTE — ED Notes (Signed)
ED Provider at bedside. 

## 2018-05-26 NOTE — ED Triage Notes (Signed)
Was having a bm and had middle chest pain, it was sudden, got slightly sweaty, no sob no radiating pain, , more cramping, took 3 asa baby

## 2018-05-26 NOTE — ED Notes (Signed)
Patient transported to X-ray 

## 2018-05-27 LAB — URINE CULTURE: Culture: >=100000 — AB

## 2018-05-29 ENCOUNTER — Encounter: Payer: BC Managed Care – PPO | Admitting: Family Medicine

## 2018-07-17 ENCOUNTER — Other Ambulatory Visit: Payer: Self-pay | Admitting: Family Medicine

## 2018-10-04 ENCOUNTER — Other Ambulatory Visit: Payer: Self-pay | Admitting: Family Medicine

## 2018-10-18 ENCOUNTER — Encounter: Payer: Self-pay | Admitting: Family Medicine

## 2018-10-23 ENCOUNTER — Encounter: Payer: BC Managed Care – PPO | Admitting: Family Medicine

## 2018-11-03 ENCOUNTER — Other Ambulatory Visit: Payer: Self-pay

## 2018-11-03 ENCOUNTER — Ambulatory Visit (INDEPENDENT_AMBULATORY_CARE_PROVIDER_SITE_OTHER): Payer: BC Managed Care – PPO | Admitting: Family Medicine

## 2018-11-03 ENCOUNTER — Encounter: Payer: Self-pay | Admitting: Family Medicine

## 2018-11-03 DIAGNOSIS — J309 Allergic rhinitis, unspecified: Secondary | ICD-10-CM

## 2018-11-03 DIAGNOSIS — E7800 Pure hypercholesterolemia, unspecified: Secondary | ICD-10-CM

## 2018-11-03 DIAGNOSIS — R7301 Impaired fasting glucose: Secondary | ICD-10-CM

## 2018-11-03 DIAGNOSIS — Z13220 Encounter for screening for lipoid disorders: Secondary | ICD-10-CM

## 2018-11-03 DIAGNOSIS — R748 Abnormal levels of other serum enzymes: Secondary | ICD-10-CM | POA: Diagnosis not present

## 2018-11-03 DIAGNOSIS — I10 Essential (primary) hypertension: Secondary | ICD-10-CM

## 2018-11-03 MED ORDER — LOSARTAN POTASSIUM 50 MG PO TABS: 50.0000 mg | ORAL_TABLET | Freq: Every day | ORAL | 1 refills | Status: DC

## 2018-11-03 NOTE — Progress Notes (Signed)
Virtual Visit via Video Note  I connected with@ on 11/03/18 at  8:00 AM EDT by a video enabled telemedicine application and verified that I am speaking with the correct person using two identifiers.  Location patient: home Location provider:work or home office Persons participating in the virtual visit: patient, provider  I discussed the limitations of evaluation and management by telemedicine and the availability of in person appointments. The patient expressed understanding and agreed to proceed.   Fuller Canada DOB: May 26, 1951 Encounter date: 11/03/2018  This is a 68 y.o. female who presents to establish care; patient of Dr. Maudie Mercury. Last visit with [facility] was 01/2017 and was CPE.  Chief Complaint  Patient presents with  . Transitions Of Care   States no specific concerns today. Uses tylenol or advil pm all the time at night to help with sleeping. Will sleep through night with this.   History of present illness:  HTN: on losartan. No problems with medication. Has not been checking her bp regularly. Historically in 120-130/70-80.  Allergic rhinitis: Have been "in check". Bothered her when things first started blooming.   Arthritis (hx R knee): aches and pains with this. Knee has been better. Had fractured ankle about 3 years ago and worked through that which she thinks aggravated knee. Low back is what bothers her more now. Notes more with lifting/more yard work. Has had some lower back issues for about 20years. Does see chiropractor every 5-6 weeks which keeps her functioning well.   Obgyn: [facility]; she is due for appointment. Last mammogram was 03/2018 - normal.  Follows with dermatology - follows with Dr. Renda Rolls regularly.  Last colonoscopy 01/2016 repeat due 01/2021  Was in ER 05/2018 with chest pain. labwork at that time - elevated AST (61), slightly elevated glucose. Last lipids were 01/2017. A1C was 5.7 at that time.  Walks dog every for 45 minutes to an hour.   Past  Medical History:  Diagnosis Date  . Allergy   . Arthritis   . Colon polyps   . Hypertension    Past Surgical History:  Procedure Laterality Date  . COLONOSCOPY    . orthoscopy Left 1994   KNEE  . TONSILLECTOMY    . wisdom teeth extraction  1970   No Known Allergies Current Meds  Medication Sig  . BIOTIN PO Take 1 tablet by mouth daily.  . Cholecalciferol (VITAMIN D PO) Take 1,000 Units by mouth daily.   . diphenhydramine-acetaminophen (TYLENOL PM) 25-500 MG TABS tablet Take 1 tablet by mouth at bedtime.  . fexofenadine (ALLEGRA) 60 MG tablet Take 60 mg by mouth daily.  . fluticasone (FLONASE) 50 MCG/ACT nasal spray USE 2 SPRAYS IN EACH NOSTRIL ONCE DAILY (Patient taking differently: Place 2 sprays into both nostrils daily. )  . losartan (COZAAR) 50 MG tablet TAKE 1 TABLET BY MOUTH DAILY.  Marland Kitchen LUTEIN PO Take 1 tablet by mouth daily.   . psyllium (METAMUCIL) 58.6 % powder Take 1 packet by mouth daily.  . Pyridoxine HCl (VITAMIN B-6 PO) Take 1 tablet by mouth daily.    Social History   Tobacco Use  . Smoking status: Former Smoker    Types: Cigarettes    Last attempt to quit: 01/04/1979    Years since quitting: 39.8  . Smokeless tobacco: Never Used  Substance Use Topics  . Alcohol use: Yes    Alcohol/week: 4.0 standard drinks    Types: 4 Shots of liquor per week   Family History  Problem Relation  Age of Onset  . Alzheimer's disease Mother 63       passed at 57  . Diverticulosis Mother   . Heart disease Father 83       CAD, bypass  . Cancer Maternal Grandfather 26       colon cancer  . Migraines Sister   . Esophageal cancer Neg Hx   . Stomach cancer Neg Hx   . Rectal cancer Neg Hx      Review of Systems  Respiratory: Negative for cough, chest tightness and shortness of breath.   Cardiovascular: Negative for chest pain.  Gastrointestinal: Negative for abdominal pain.  Psychiatric/Behavioral: Sleep disturbance: does ok with tylenol or advil pm.     Objective:  LMP 07/19/1997       BP Readings from Last 3 Encounters:  05/26/18 (!) 144/82  01/18/17 128/80  02/11/16 133/73   Wt Readings from Last 3 Encounters:  01/18/17 211 lb 1.6 oz (95.8 kg)  02/11/16 207 lb (93.9 kg)  01/26/16 207 lb 9.6 oz (94.2 kg)    EXAM:  GENERAL: alert, oriented, appears well and in no acute distress  HEENT: atraumatic, conjunctiva clear, no obvious abnormalities on inspection of external nose and ears  NECK: normal movements of the head and neck  LUNGS: on inspection no signs of respiratory distress, breathing rate appears normal, no obvious gross SOB, gasping or wheezing  CV: no obvious cyanosis  MS: moves all visible extremities without noticeable abnormality  PSYCH/NEURO: pleasant and cooperative, no obvious depression or anxiety, speech and thought processing grossly intact  Assessment/Plan  1. Essential hypertension Advised checking at home so we have baseline.  - Comprehensive metabolic panel; Future  2. Allergic rhinitis, unspecified seasonality, unspecified trigger Stable; continue current otc medications.   3. Elevated liver enzymes Will plan to recheck once risk of corona improves.  - Comprehensive metabolic panel; Future  4. Elevated fasting glucose - Hemoglobin A1c; Future  5. Lipid screening - Lipid panel; Future  6. Elevated LDL cholesterol level - Lipid panel; Future - TSH; Future   Return July - bloodwork and CCV.  I discussed the assessment and treatment plan with the patient. The patient was provided an opportunity to ask questions and all were answered. The patient agreed with the plan and demonstrated an understanding of the instructions.   The patient was advised to call back or seek an in-person evaluation if the symptoms worsen or if the condition fails to improve as anticipated.  Micheline Rough, MD

## 2019-04-02 ENCOUNTER — Other Ambulatory Visit: Payer: Self-pay | Admitting: Family Medicine

## 2019-04-02 DIAGNOSIS — Z1231 Encounter for screening mammogram for malignant neoplasm of breast: Secondary | ICD-10-CM

## 2019-04-09 ENCOUNTER — Ambulatory Visit
Admission: RE | Admit: 2019-04-09 | Discharge: 2019-04-09 | Disposition: A | Discharge status: Home / Self Care | Payer: BC Managed Care – PPO | Source: Ambulatory Visit | Attending: Family Medicine | Admitting: Family Medicine

## 2019-04-09 ENCOUNTER — Other Ambulatory Visit: Payer: Self-pay

## 2019-04-09 DIAGNOSIS — Z1231 Encounter for screening mammogram for malignant neoplasm of breast: Secondary | ICD-10-CM

## 2019-06-07 IMAGING — CR DG CHEST 2V
2 series · 2 of 2 positions shown · non-contrast
Comparison: Patient's prior chest x-ray is not available on PACs
for comparison.

CLINICAL DATA: Sudden onset chest pain this morning.

EXAM:
CHEST - 2 VIEW

[chest pa]
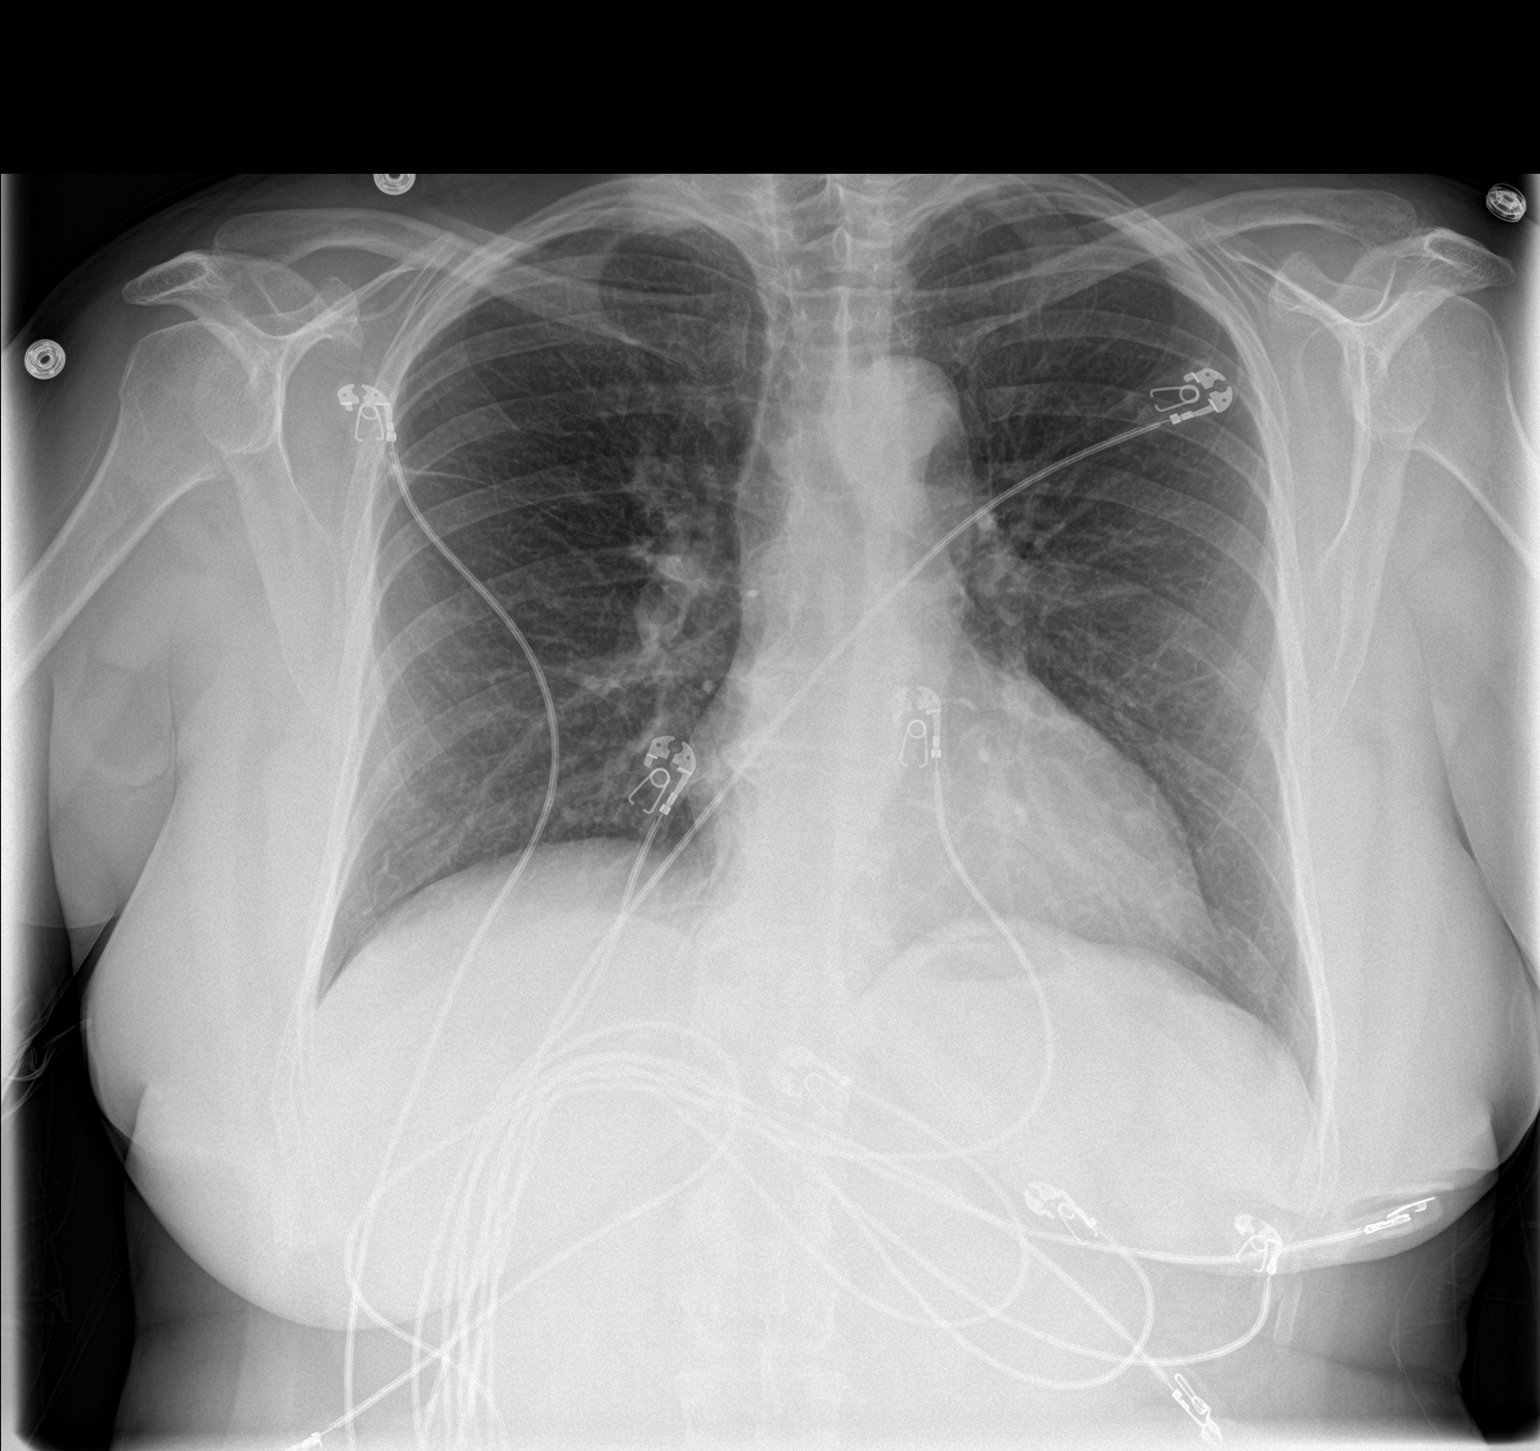

[chest lat]
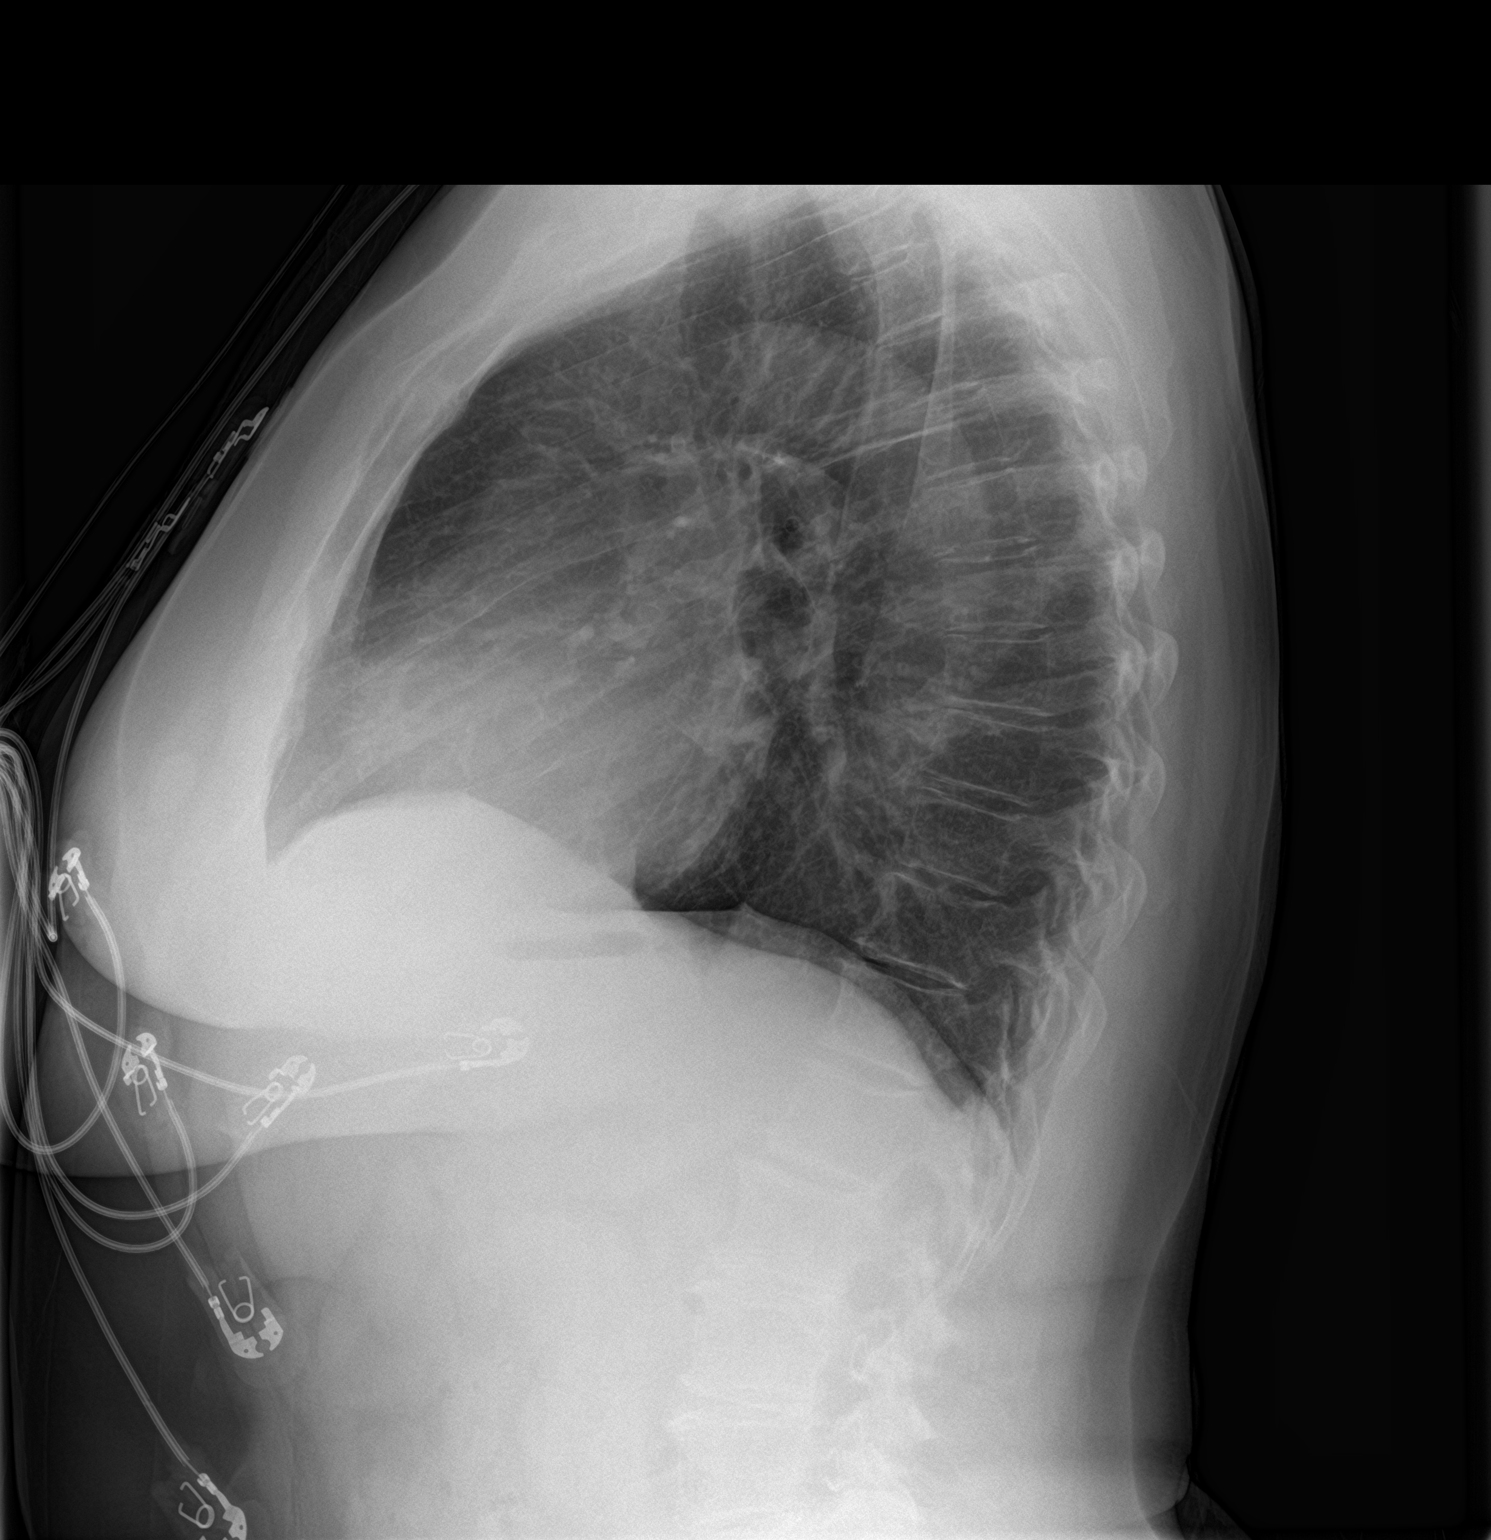

[2 of 2 positions shown; findings below may reference images not displayed]

FINDINGS: The heart size and mediastinal contours are within normal limits.
Both lungs are clear. Mild degenerative joint changes of the spine
are noted.
IMPRESSION: No active cardiopulmonary disease.

## 2019-06-29 ENCOUNTER — Other Ambulatory Visit: Payer: Self-pay | Admitting: Family Medicine

## 2019-08-14 ENCOUNTER — Ambulatory Visit: Payer: BC Managed Care – PPO

## 2019-08-23 ENCOUNTER — Ambulatory Visit: Payer: BC Managed Care – PPO

## 2019-08-31 ENCOUNTER — Ambulatory Visit: Payer: BC Managed Care – PPO

## 2019-09-10 ENCOUNTER — Other Ambulatory Visit: Payer: Self-pay | Admitting: Family Medicine

## 2019-10-01 ENCOUNTER — Encounter: Payer: Self-pay | Admitting: Family Medicine

## 2019-10-01 DIAGNOSIS — Z01419 Encounter for gynecological examination (general) (routine) without abnormal findings: Secondary | ICD-10-CM

## 2019-10-01 NOTE — Telephone Encounter (Signed)
*  I am fine with referral if desired. Please get previous doc name so this can be entered and go ahead and place referral. *another option is having me do pap/wellness if she desires. I am happy to do this if it makes things easier for her.

## 2019-11-14 ENCOUNTER — Telehealth: Payer: Self-pay | Admitting: *Deleted

## 2019-11-14 NOTE — Telephone Encounter (Signed)
Pt was returning [place] call. I went ahead and informed the pt that that office has been trying to contact her to set up an appt. Pt understood and will call their office. Nothing further

## 2019-11-14 NOTE — Telephone Encounter (Signed)
I left a detailed message for the pt to contact Dr Gentry Fitz office for an appt. Sherryle Lis   ===View-only below this line=== ----- Message ----- From: [name], MD Sent: 11/14/2019  10:31 AM EDT To: Agnes Lawrence, CMA  Please let patient know dr. Gentry Fitz office trying to reach her for scheduling. She requested referral. Please give her number. ----- Message ----- From: Romie Jumper Sent: 11/14/2019   9:08 AM EDT To: [name], MD, Carney Bern, *  To whom this may concern:  We have attempted to contact this patient several times and to date, they have not returned our call. At this time we will no longer attempt to contact them, but will be more than happy to schedule an appointment if they contact our office.  Thank you so much for your referral,  [facility]

## 2019-11-16 ENCOUNTER — Other Ambulatory Visit: Payer: Self-pay

## 2019-11-16 ENCOUNTER — Encounter: Payer: Self-pay | Admitting: Family Medicine

## 2019-11-16 ENCOUNTER — Ambulatory Visit (INDEPENDENT_AMBULATORY_CARE_PROVIDER_SITE_OTHER): Payer: Medicare Other | Admitting: Family Medicine

## 2019-11-16 VITALS — BP 160/80 | HR 89 | Temp 98.4°F | Wt 207.6 lb

## 2019-11-16 DIAGNOSIS — I10 Essential (primary) hypertension: Secondary | ICD-10-CM

## 2019-11-16 LAB — BASIC METABOLIC PANEL
BUN: 12 mg/dL (ref 6–23)
CO2: 26 mEq/L (ref 19–32)
Calcium: 9.2 mg/dL (ref 8.4–10.5)
Chloride: 104 mEq/L (ref 96–112)
Creatinine, Ser: 0.66 mg/dL (ref 0.40–1.20)
GFR: 88.45 mL/min (ref >60.00)
Glucose, Bld: 104 mg/dL — ABNORMAL HIGH (ref 70–99)
Potassium: 4.3 mEq/L (ref 3.5–5.1)
Sodium: 139 mEq/L (ref 135–145)

## 2019-11-16 LAB — CBC WITH DIFFERENTIAL/PLATELET
Basophils Absolute: 0 10*3/uL (ref 0.0–0.1)
Basophils Relative: 0.8 % (ref 0.0–3.0)
Eosinophils Absolute: 0 10*3/uL (ref 0.0–0.7)
Eosinophils Relative: 0.5 % (ref 0.0–5.0)
HCT: 41 % (ref 36.0–46.0)
Hemoglobin: 14 g/dL (ref 12.0–15.0)
Lymphocytes Relative: 23 % (ref 12.0–46.0)
Lymphs Abs: 1.4 10*3/uL (ref 0.7–4.0)
MCHC: 34.3 g/dL (ref 30.0–36.0)
MCV: 92.1 fl (ref 78.0–100.0)
Monocytes Absolute: 0.5 10*3/uL (ref 0.1–1.0)
Monocytes Relative: 7.8 % (ref 3.0–12.0)
Neutro Abs: 4.2 10*3/uL (ref 1.4–7.7)
Neutrophils Relative %: 67.9 % (ref 43.0–77.0)
Platelets: 293 10*3/uL (ref 150.0–400.0)
RBC: 4.45 Mil/uL (ref 3.87–5.11)
RDW: 13.5 % (ref 11.5–15.5)
WBC: 6.2 10*3/uL (ref 4.0–10.5)

## 2019-11-16 LAB — LIPID PANEL
Cholesterol: 234 mg/dL — ABNORMAL HIGH (ref 0–200)
HDL: 67.3 mg/dL (ref >39.00)
LDL Cholesterol: 153 mg/dL — ABNORMAL HIGH (ref 0–99)
NonHDL: 166.34
Total CHOL/HDL Ratio: 3
Triglycerides: 68 mg/dL (ref 0.0–149.0)
VLDL: 13.6 mg/dL (ref 0.0–40.0)

## 2019-11-16 LAB — HEPATIC FUNCTION PANEL
ALT: 17 U/L (ref 0–35)
AST: 17 U/L (ref 0–37)
Albumin: 4.3 g/dL (ref 3.5–5.2)
Alkaline Phosphatase: 78 U/L (ref 39–117)
Bilirubin, Direct: 0.1 mg/dL (ref 0.0–0.3)
Total Bilirubin: 0.7 mg/dL (ref 0.2–1.2)
Total Protein: 6.9 g/dL (ref 6.0–8.3)

## 2019-11-16 LAB — TSH: TSH: 2.62 u[IU]/mL (ref 0.35–4.50)

## 2019-11-16 MED ORDER — LOSARTAN POTASSIUM 100 MG PO TABS: 100.0000 mg | ORAL_TABLET | Freq: Every day | ORAL | 2 refills | Status: DC

## 2019-11-16 NOTE — Progress Notes (Signed)
   Subjective:    Patient ID: Melinda Ferguson, female    DOB: 22-Jan-1951, 69 y.o.   MRN: [id number]  HPI Here to check her BP. This has not been checked since last year apparently. She does not check her BP at home. Lately she has felt some mild intermittent pulsating sensations in her head and she thought her BP may be high. No headache or chest pain or SOB. She has some slight ankle edema which she has had for years, but this has not changed recently. She has been on Losartan 50 mg daily for many years.    Review of Systems  Constitutional: Negative.   Respiratory: Negative.   Cardiovascular: Negative.   Neurological: Negative.        Objective:   Physical Exam Constitutional:      Appearance: She is obese. She is not ill-appearing.  Cardiovascular:     Rate and Rhythm: Normal rate and regular rhythm.     Pulses: Normal pulses.     Heart sounds: Normal heart sounds.  Pulmonary:     Effort: Pulmonary effort is normal.     Breath sounds: Normal breath sounds.  Musculoskeletal:     Comments: 1+ edema in the right ankle and trace edema in the left ankle   Neurological:     Mental Status: She is alert.           Assessment & Plan:  HTN, we will increase the Losartan to 100 mg daily. Get labs today to include renal function. I suggested she exercise daily. I also suggested she follow the BP at home. Recheck in 2-3 weeks.  Alysia Penna, MD

## 2019-11-19 ENCOUNTER — Ambulatory Visit: Payer: BC Managed Care – PPO | Admitting: Family Medicine

## 2019-11-30 ENCOUNTER — Other Ambulatory Visit: Payer: Self-pay

## 2019-12-03 ENCOUNTER — Ambulatory Visit (INDEPENDENT_AMBULATORY_CARE_PROVIDER_SITE_OTHER): Payer: Medicare Other | Admitting: Obstetrics and Gynecology

## 2019-12-03 ENCOUNTER — Ambulatory Visit (INDEPENDENT_AMBULATORY_CARE_PROVIDER_SITE_OTHER): Payer: Medicare Other | Admitting: Family Medicine

## 2019-12-03 ENCOUNTER — Encounter: Payer: Self-pay | Admitting: Family Medicine

## 2019-12-03 ENCOUNTER — Other Ambulatory Visit (HOSPITAL_COMMUNITY)
Admission: RE | Admit: 2019-12-03 | Discharge: 2019-12-03 | Disposition: A | Discharge status: Home / Self Care | Payer: Medicare Other | Source: Ambulatory Visit | Attending: Obstetrics and Gynecology | Admitting: Obstetrics and Gynecology

## 2019-12-03 ENCOUNTER — Other Ambulatory Visit: Payer: Self-pay

## 2019-12-03 ENCOUNTER — Encounter: Payer: Self-pay | Admitting: Obstetrics and Gynecology

## 2019-12-03 VITALS — BP 140/68 | HR 64 | Temp 97.9°F | Ht 63.0 in | Wt 202.0 lb

## 2019-12-03 VITALS — BP 144/76 | HR 75 | Temp 97.7°F | Ht 65.0 in | Wt 203.9 lb

## 2019-12-03 DIAGNOSIS — M79645 Pain in left finger(s): Secondary | ICD-10-CM

## 2019-12-03 DIAGNOSIS — Z124 Encounter for screening for malignant neoplasm of cervix: Secondary | ICD-10-CM | POA: Insufficient documentation

## 2019-12-03 DIAGNOSIS — I10 Essential (primary) hypertension: Secondary | ICD-10-CM | POA: Diagnosis not present

## 2019-12-03 DIAGNOSIS — Z01419 Encounter for gynecological examination (general) (routine) without abnormal findings: Secondary | ICD-10-CM

## 2019-12-03 NOTE — Patient Instructions (Signed)
EXERCISE AND DIET:  We recommended that you start or continue a regular exercise program for good health. Regular exercise means any activity that makes your heart beat faster and makes you sweat.  We recommend exercising at least 30 minutes per day at least 3 days a week, preferably 4 or 5.  We also recommend a diet low in fat and sugar.  Inactivity, poor dietary choices and obesity can cause diabetes, heart attack, stroke, and kidney damage, among others.    ALCOHOL AND SMOKING:  Women should limit their alcohol intake to no more than 7 drinks/beers/glasses of wine (combined, not each!) per week. Moderation of alcohol intake to this level decreases your risk of breast cancer and liver damage. And of course, no recreational drugs are part of a healthy lifestyle.  And absolutely no smoking or even second hand smoke. Most people know smoking can cause heart and lung diseases, but did you know it also contributes to weakening of your bones? Aging of your skin?  Yellowing of your teeth and nails?  CALCIUM AND VITAMIN D:  Adequate intake of calcium and Vitamin D are recommended.  The recommendations for exact amounts of these supplements seem to change often, but generally speaking 1,200 mg of calcium (between diet and supplement) and 800 units of Vitamin D per day seems prudent. Certain women may benefit from higher intake of Vitamin D.  If you are among these women, your doctor will have told you during your visit.    PAP SMEARS:  Pap smears, to check for cervical cancer or precancers,  have traditionally been done yearly, although recent scientific advances have shown that most women can have pap smears less often.  However, every woman still should have a physical exam from her gynecologist every year. It will include a breast check, inspection of the vulva and vagina to check for abnormal growths or skin changes, a visual exam of the cervix, and then an exam to evaluate the size and shape of the uterus and  ovaries.  And after 71 years of age, a rectal exam is indicated to check for rectal cancers. We will also provide age appropriate advice regarding health maintenance, like when you should have certain vaccines, screening for sexually transmitted diseases, bone density testing, colonoscopy, mammograms, etc.   MAMMOGRAMS:  All women over 40 years old should have a yearly mammogram. Many facilities now offer a "3D" mammogram, which may cost around $50 extra out of pocket. If possible,  we recommend you accept the option to have the 3D mammogram performed.  It both reduces the number of women who will be called back for extra views which then turn out to be normal, and it is better than the routine mammogram at detecting truly abnormal areas.    COLON CANCER SCREENING: Now recommend starting at age 45. At this time colonoscopy is not covered for routine screening until 50. There are take home tests that can be done between 45-49.   COLONOSCOPY:  Colonoscopy to screen for colon cancer is recommended for all women at age 50.  We know, you hate the idea of the prep.  We agree, BUT, having colon cancer and not knowing it is worse!!  Colon cancer so often starts as a polyp that can be seen and removed at colonscopy, which can quite literally save your life!  And if your first colonoscopy is normal and you have no family history of colon cancer, most women don't have to have it again for   10 years.  Once every ten years, you can do something that may end up saving your life, right?  We will be happy to help you get it scheduled when you are ready.  Be sure to check your insurance coverage so you understand how much it will cost.  It may be covered as a preventative service at no cost, but you should check your particular policy.      Breast Self-Awareness Breast self-awareness means being familiar with how your breasts look and feel. It involves checking your breasts regularly and reporting any changes to your  health care provider. Practicing breast self-awareness is important. A change in your breasts can be a sign of a serious medical problem. Being familiar with how your breasts look and feel allows you to find any problems early, when treatment is more likely to be successful. All women should practice breast self-awareness, including women who have had breast implants. How to do a breast self-exam One way to learn what is normal for your breasts and whether your breasts are changing is to do a breast self-exam. To do a breast self-exam: Look for Changes  1. Remove all the clothing above your waist. 2. Stand in front of a mirror in a room with good lighting. 3. Put your hands on your hips. 4. Push your hands firmly downward. 5. Compare your breasts in the mirror. Look for differences between them (asymmetry), such as: ? Differences in shape. ? Differences in size. ? Puckers, dips, and bumps in one breast and not the other. 6. Look at each breast for changes in your skin, such as: ? Redness. ? Scaly areas. 7. Look for changes in your nipples, such as: ? Discharge. ? Bleeding. ? Dimpling. ? Redness. ? A change in position. Feel for Changes Carefully feel your breasts for lumps and changes. It is best to do this while lying on your back on the floor and again while sitting or standing in the shower or tub with soapy water on your skin. Feel each breast in the following way:  Place the arm on the side of the breast you are examining above your head.  Feel your breast with the other hand.  Start in the nipple area and make  inch (2 cm) overlapping circles to feel your breast. Use the pads of your three middle fingers to do this. Apply light pressure, then medium pressure, then firm pressure. The light pressure will allow you to feel the tissue closest to the skin. The medium pressure will allow you to feel the tissue that is a little deeper. The firm pressure will allow you to feel the tissue  close to the ribs.  Continue the overlapping circles, moving downward over the breast until you feel your ribs below your breast.  Move one finger-width toward the center of the body. Continue to use the  inch (2 cm) overlapping circles to feel your breast as you move slowly up toward your collarbone.  Continue the up and down exam using all three pressures until you reach your armpit.  Write Down What You Find  Write down what is normal for each breast and any changes that you find. Keep a written record with breast changes or normal findings for each breast. By writing this information down, you do not need to depend only on memory for size, tenderness, or location. Write down where you are in your menstrual cycle, if you are still menstruating. If you are having trouble noticing differences   in your breasts, do not get discouraged. With time you will become more familiar with the variations in your breasts and more comfortable with the exam. How often should I examine my breasts? Examine your breasts every month. If you are breastfeeding, the best time to examine your breasts is after a feeding or after using a breast pump. If you menstruate, the best time to examine your breasts is 5-7 days after your period is over. During your period, your breasts are lumpier, and it may be more difficult to notice changes. When should I see my health care provider? See your health care provider if you notice:  A change in shape or size of your breasts or nipples.  A change in the skin of your breast or nipples, such as a reddened or scaly area.  Unusual discharge from your nipples.  A lump or thick area that was not there before.  Pain in your breasts.  Anything that concerns you.  

## 2019-12-03 NOTE — Progress Notes (Signed)
69 y.o. [id number] Married White or Caucasian Not Hispanic or Latino female here for annual exam.   No bleeding. Sexually active, no pain. Using a lubricant.   Occasional urge incontinence. Small amounts 3 x a week (couple of drops). Drinks 2 cups of coffee a day.     Patient's last menstrual period was 07/19/1997.          Sexually active: Yes.    The current method of family planning is post menopausal status.    Exercising: Yes.    walking  Smoker:  no  Health Maintenance: Pap:  Unsure  History of abnormal Pap:  no MMG:  04/09/19 Bi-rads 1 neg   BMD:   01/24/17 Osteopenic, T score -1.5,  FRAX 8.8/1.0 Colonoscopy: 2017 normal TDaP:  01/04/12 Gardasil: NA   reports that she quit smoking about 40 years ago. Her smoking use included cigarettes. She has never used smokeless tobacco. She reports current alcohol use of about 4.0 standard drinks of alcohol per week. She reports that she does not use drugs. Just retired. 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  Son is [place], [place]. No kids.   Past Medical History:  Diagnosis Date  . Allergy   . Arthritis   . Colon polyps   . Hypertension     Past Surgical History:  Procedure Laterality Date  . COLONOSCOPY    . orthoscopy Left 1994   KNEE  . TONSILLECTOMY    . wisdom teeth extraction  1970    Current Outpatient Medications  Medication Sig Dispense Refill  . BIOTIN PO Take 1 tablet by mouth daily.    . Cholecalciferol (VITAMIN D PO) Take 1,000 Units by mouth daily.     . diphenhydramine-acetaminophen (TYLENOL PM) 25-500 MG TABS tablet Take 1 tablet by mouth at bedtime.    . fexofenadine (ALLEGRA) 60 MG tablet Take 60 mg by mouth daily.    . fluticasone (FLONASE) 50 MCG/ACT nasal spray USE 2 SPRAYS IN EACH NOSTRIL ONCE DAILY (Patient taking differently: Place 2 sprays into both nostrils daily. ) 16 g 3  . losartan (COZAAR) 100 MG tablet Take 1 tablet (100 mg total) by mouth daily. 30 tablet 2  . LUTEIN PO Take 1 tablet by  mouth daily.     Marland Kitchen OVER THE COUNTER MEDICATION Take 1 drop by mouth at bedtime. CBD Oil    . psyllium (METAMUCIL) 58.6 % powder Take 1 packet by mouth daily.    . Pyridoxine HCl (VITAMIN B-6 PO) Take 1 tablet by mouth daily.     . Turmeric (QC TUMERIC COMPLEX) 500 MG CAPS Take by mouth.    . vitamin E 1000 UNIT capsule Take 1,000 Units by mouth daily.     No current facility-administered medications for this visit.    Family History  Problem Relation Age of Onset  . Alzheimer's disease Mother 74       passed at 31  . Diverticulosis Mother   . Heart disease Father 1       CAD, bypass  . Cancer Maternal Grandfather 26       colon cancer  . Migraines Sister   . Esophageal cancer Neg Hx   . Stomach cancer Neg Hx   . Rectal cancer Neg Hx   MGF died at 69 with colon cancer  Review of Systems  All other systems reviewed and are negative.   Exam:   BP 140/68   Pulse 64   Temp 97.9 F (36.6 C)  Ht 5\' 3"  (1.6 m)   Wt 202 lb (91.6 kg)   LMP 07/19/1997   SpO2 98%   BMI 35.78 kg/m   Weight change: @WEIGHTCHANGE @ Height:   Height: 5\' 3"  (160 cm)  Ht Readings from Last 3 Encounters:  12/03/19 5\' 3"  (1.6 m)  12/03/19 5\' 5"  (1.651 m)  01/18/17 5\' 5"  (1.651 m)    General appearance: alert, cooperative and appears stated age Head: Normocephalic, without obvious abnormality, atraumatic Neck: no adenopathy, supple, symmetrical, trachea midline and thyroid normal to inspection and palpation Lungs: clear to auscultation bilaterally Cardiovascular: regular rate and rhythm Breasts: normal appearance, no masses or tenderness Abdomen: soft, non-tender; non distended,  no masses,  no organomegaly Extremities: extremities normal, atraumatic, no cyanosis or edema Skin: Skin color, texture, turgor normal. No rashes or lesions Lymph nodes: Cervical, supraclavicular, and axillary nodes normal. No abnormal inguinal nodes palpated Neurologic: Grossly normal   Pelvic: External genitalia:   no lesions              Urethra:  normal appearing urethra with no masses, tenderness or lesions              Bartholins and Skenes: normal                 Vagina: normal appearing vagina with normal color and discharge, no lesions              Cervix: no lesions               Bimanual Exam:  Uterus:  normal size, contour, position, consistency, mobility, non-tender              Adnexa: no mass, fullness, tenderness               Rectovaginal: Confirms               Anus:  normal sphincter tone, no lesions  Gae Dry chaperoned for the exam.  A:  Well Woman with normal exam  P:   Pap with reflex hpv  Mammogram in 9/21  DEXA with primary  Discussed breast self exam  Discussed calcium and vit D intake  Labs with her primary

## 2019-12-03 NOTE — Progress Notes (Signed)
Fuller Canada DOB: [birth year] Encounter date: 12/03/2019  This is a 69 y.o. female who presents with Chief Complaint  Patient presents with  . Hand Pain    patient complains of left thumb pain x1 year, worse past few months, wearing brace now, no known injury, does a lot of gardening    History of present illness: Thumb on left hand really bothering her. Has to wear some sort of support. She is left handed. She is getting some catching with bending. Hurts with any fine motor use. Hurts to use it so it feels weak. In last several days she has used right hand mostly and it has helped with pain in left hand. Bothering her at last a year, but really noting it more daily in last couple of months. Might be from gardening. No specific injury. Pain in whole of thumb and some swelling. Can't flatten out. Brace does help.   Has been taking losartan 100mg . Not checking at home.   No Known Allergies Current Meds  Medication Sig  . BIOTIN PO Take 1 tablet by mouth daily.  . Cholecalciferol (VITAMIN D PO) Take 1,000 Units by mouth daily.   . diphenhydramine-acetaminophen (TYLENOL PM) 25-500 MG TABS tablet Take 1 tablet by mouth at bedtime.  . fexofenadine (ALLEGRA) 60 MG tablet Take 60 mg by mouth daily.  . fluticasone (FLONASE) 50 MCG/ACT nasal spray USE 2 SPRAYS IN EACH NOSTRIL ONCE DAILY (Patient taking differently: Place 2 sprays into both nostrils daily. )  . losartan (COZAAR) 100 MG tablet Take 1 tablet (100 mg total) by mouth daily.  . LUTEIN PO Take 1 tablet by mouth daily.   Marland Kitchen OVER THE COUNTER MEDICATION Take 1 drop by mouth at bedtime. CBD Oil  . psyllium (METAMUCIL) 58.6 % powder Take 1 packet by mouth daily.  . Pyridoxine HCl (VITAMIN B-6 PO) Take 1 tablet by mouth daily.     Review of Systems  Constitutional: Negative for chills, fatigue and fever.  Respiratory: Negative for cough, chest tightness, shortness of breath and wheezing.   Cardiovascular: Negative for chest pain,  palpitations and leg swelling.  Musculoskeletal:       See hpi     Objective:  BP (!) 150/90 (BP Location: Left Arm, Patient Position: Sitting, Cuff Size: Large)   Pulse 75   Temp 97.7 F (36.5 C) (Temporal)   Ht 5\' 5"  (1.651 m)   Wt 203 lb 14.4 oz (92.5 kg)   LMP 07/19/1997   BMI 33.93 kg/m   Weight: 203 lb 14.4 oz (92.5 kg)   BP Readings from Last 3 Encounters:  12/03/19 (!) 150/90  11/16/19 (!) 160/80  05/26/18 (!) 144/82   Wt Readings from Last 3 Encounters:  12/03/19 203 lb 14.4 oz (92.5 kg)  11/16/19 207 lb 9.6 oz (94.2 kg)  01/18/17 211 lb 1.6 oz (95.8 kg)    Physical Exam Constitutional:      General: She is not in acute distress.    Appearance: She is well-developed.  Cardiovascular:     Rate and Rhythm: Normal rate and regular rhythm.     Heart sounds: Normal heart sounds. No murmur. No friction rub.  Pulmonary:     Effort: Pulmonary effort is normal. No respiratory distress.     Breath sounds: Normal breath sounds. No wheezing or rales.  Musculoskeletal:     Right lower leg: No edema.     Left lower leg: No edema.     Comments: There is tenderness with  palpation along the flexor tendon thumb left hand.  There is some edema in the thenar eminence and some palmar deviation of the first MCP joint, which I suspect is secondary to arthritis.  Neurological:     Mental Status: She is alert and oriented to person, place, and time.  Psychiatric:        Behavior: Behavior normal.     Assessment/Plan  1. Thumb pain, left Referring to surgical specialist.  She is having some triggering of her thumb and ongoing daily pain.  She is left-handed.  I feel will be best to have there intervention at this point since symptoms have been going on for a year. - Ambulatory referral to Hand Surgery  2. Essential hypertension She is going to report back blood pressures to me in 1 to 2 weeks time.  Blood pressure is always higher at the doctor's office, but she feels its been  better controlled at home.  If elevated at home, I would add diuretic.   Return for pending mychart update.    Micheline Rough, MD

## 2019-12-03 NOTE — Patient Instructions (Signed)
Check blood pressures at home and report back in 1-2 weeks through mychart.

## 2019-12-04 LAB — CYTOLOGY - PAP: Diagnosis: NEGATIVE

## 2019-12-05 ENCOUNTER — Encounter: Payer: Self-pay | Admitting: Orthopaedic Surgery

## 2019-12-05 ENCOUNTER — Other Ambulatory Visit: Payer: Self-pay

## 2019-12-05 ENCOUNTER — Ambulatory Visit (INDEPENDENT_AMBULATORY_CARE_PROVIDER_SITE_OTHER): Payer: Medicare Other | Admitting: Orthopaedic Surgery

## 2019-12-05 ENCOUNTER — Ambulatory Visit (INDEPENDENT_AMBULATORY_CARE_PROVIDER_SITE_OTHER): Payer: Medicare Other

## 2019-12-05 DIAGNOSIS — M65312 Trigger thumb, left thumb: Secondary | ICD-10-CM | POA: Diagnosis not present

## 2019-12-05 DIAGNOSIS — M19032 Primary osteoarthritis, left wrist: Secondary | ICD-10-CM | POA: Diagnosis not present

## 2019-12-05 DIAGNOSIS — M79645 Pain in left finger(s): Secondary | ICD-10-CM | POA: Diagnosis not present

## 2019-12-05 MED ORDER — METHYLPREDNISOLONE ACETATE 40 MG/ML IJ SUSP
20.0000 mg | INTRAMUSCULAR | Status: AC | PRN
  Administered 2019-12-05: 20 mg

## 2019-12-05 MED ORDER — LIDOCAINE HCL 1 % IJ SOLN
0.5000 mL | INTRAMUSCULAR | Status: AC | PRN
  Administered 2019-12-05: .5 mL

## 2019-12-05 NOTE — Progress Notes (Signed)
Office Visit Note   Patient: Melinda Ferguson           Date of Birth: 04-Dec-1950           MRN: [id number] Visit Date: 12/05/2019              Requested by: [name], MD [place],  [place] [address] PCP: [name], MD   Assessment & Plan: Visit Diagnoses:  1. Pain of left thumb   2. Trigger thumb, left thumb   3. Localized primary osteoarthritis of carpometacarpal (CMC) joint of left wrist     Plan: I have recommended steroid injections of the A1 pulley and the basilar thumb joint left side.  I explained the rationale behind steroid injections as well as the risk and benefits involved.  All questions and concerns were answered addressed.  She did tolerate the injections well.  Follow-up will be as needed.  If things worsen she will let us know.  We can always repeat injections later if needed.  I have recommended she try Voltaren gel as well.  Follow-Up Instructions: Return if symptoms worsen or fail to improve.   Orders:  Orders Placed This Encounter  Procedures  . Hand/UE Inj  . Hand/UE Inj  . XR Finger Thumb Left   No orders of the defined types were placed in this encounter.     Procedures: Hand/UE Inj: L thumb A1 for trigger finger on 12/05/2019 4:07 PM Medications: 0.5 mL lidocaine 1 %; 20 mg methylPREDNISolone acetate 40 MG/ML  Hand/UE Inj: L thumb CMC for osteoarthritis on 12/05/2019 4:08 PM Medications: 0.5 mL lidocaine 1 %; 20 mg methylPREDNISolone acetate 40 MG/ML      Clinical Data: No additional findings.   Subjective: Chief Complaint  Patient presents with  . Left Thumb - Pain  The patient is a very pleasant 69 year old female who comes in with chief complaint of left thumb pain and she points to different areas.  One is at the A1 pulley and the other areas of the base of the thumb.  She does get triggering in the thumb.  She does wear a thumb brace.  She is very active and uses her left hand predominantly.   She denies any numbness and tingling or any injury that she is aware of.  She does report decreased strength with opening things and pain in general.  She is not a smoker no diabetic  HPI  Review of Systems She currently denies a headache, chest pain, shortness of breath, fever, chills, nausea, vomiting  Objective: Vital Signs: LMP 07/19/1997   Physical Exam She is alert and orient x3 and in no acute distress Ortho Exam Examination of her left thumb does show pain over the A1 pulley with active triggering.  Her thumb is well-perfused with normal sensation.  She also has a positive grind test with pain to the basilar thumb joint. Specialty Comments:  No specialty comments available.  Imaging: XR Finger Thumb Left  Result Date: 12/05/2019 Several views of the left thumb show basilar thumb joint arthritis with joint space narrowing, sclerotic changes and osteophytes around the [facility] joint.    PMFS History: Patient Active Problem List   Diagnosis Date Noted  . Osteoarthritis of right knee - sees Dr. Wynelle Link 02/19/2014  . HTN (hypertension) 01/01/2013  . Allergic rhinitis 01/01/2013   Past Medical History:  Diagnosis Date  . Allergy   . Arthritis   . Colon polyps   .  Hypertension     Family History  Problem Relation Age of Onset  . Alzheimer's disease Mother 42       passed at 19  . Diverticulosis Mother   . Heart disease Father 66       CAD, bypass  . Cancer Maternal Grandfather 26       colon cancer  . Migraines Sister   . Esophageal cancer Neg Hx   . Stomach cancer Neg Hx   . Rectal cancer Neg Hx     Past Surgical History:  Procedure Laterality Date  . COLONOSCOPY    . orthoscopy Left 1994   KNEE  . TONSILLECTOMY    . wisdom teeth extraction  1970   Social History   Occupational History  . Occupation: Multimedia programmer  Tobacco Use  . Smoking status: Former Smoker    Types: Cigarettes    Quit date: 01/04/1979    Years since quitting: 40.9  .  Smokeless tobacco: Never Used  Substance and Sexual Activity  . Alcohol use: Yes    Alcohol/week: 4.0 standard drinks    Types: 4 Shots of liquor per week  . Drug use: No  . Sexual activity: Yes

## 2019-12-23 ENCOUNTER — Encounter: Payer: Self-pay | Admitting: Family Medicine

## 2019-12-26 ENCOUNTER — Other Ambulatory Visit: Payer: Self-pay | Admitting: Family Medicine

## 2019-12-26 MED ORDER — LOSARTAN POTASSIUM-HCTZ 100-12.5 MG PO TABS
1.0000 | ORAL_TABLET | Freq: Every day | ORAL | 1 refills | Status: DC
Start: 2019-12-26 — End: 2020-06-16

## 2020-01-11 ENCOUNTER — Encounter: Payer: BC Managed Care – PPO | Admitting: Family Medicine

## 2020-02-22 ENCOUNTER — Encounter: Payer: Self-pay | Admitting: Family Medicine

## 2020-02-25 ENCOUNTER — Other Ambulatory Visit: Payer: Self-pay | Admitting: Family Medicine

## 2020-02-25 ENCOUNTER — Other Ambulatory Visit: Payer: Self-pay

## 2020-02-25 ENCOUNTER — Encounter: Payer: Self-pay | Admitting: Family Medicine

## 2020-02-25 ENCOUNTER — Ambulatory Visit (INDEPENDENT_AMBULATORY_CARE_PROVIDER_SITE_OTHER): Payer: Medicare Other | Admitting: Family Medicine

## 2020-02-25 VITALS — BP 124/80 | HR 80 | Temp 98.0°F | Ht 63.75 in | Wt 192.0 lb

## 2020-02-25 DIAGNOSIS — J309 Allergic rhinitis, unspecified: Secondary | ICD-10-CM | POA: Diagnosis not present

## 2020-02-25 DIAGNOSIS — Z1231 Encounter for screening mammogram for malignant neoplasm of breast: Secondary | ICD-10-CM

## 2020-02-25 DIAGNOSIS — M545 Low back pain, unspecified: Secondary | ICD-10-CM

## 2020-02-25 DIAGNOSIS — I10 Essential (primary) hypertension: Secondary | ICD-10-CM

## 2020-02-25 DIAGNOSIS — G8929 Other chronic pain: Secondary | ICD-10-CM

## 2020-02-25 DIAGNOSIS — E2839 Other primary ovarian failure: Secondary | ICD-10-CM

## 2020-02-25 MED ORDER — AZELASTINE-FLUTICASONE 137-50 MCG/ACT NA SUSP: 2.0000 | Freq: Every day | NASAL | 5 refills | Status: DC

## 2020-02-25 NOTE — Patient Instructions (Addendum)
Adult Exercise Classes   1. [place] [employer]: [email or web address] [phone]) a. All classes are free, but you have to register. Zoom classes are offered. Variety includes yoga, chair yoga, arthritis classes, Tai Chi, line dancing, balance classes, boot camp, and more!   2. PBS  a. Daily chair exercises/Yoga classes     Bethena Midget - [employer] [place] [employer] (google [employer] [place] [employer])

## 2020-02-25 NOTE — Progress Notes (Signed)
Fuller Canada DOB: 1951/05/29 Encounter date: 02/25/2020  This is a 69 y.o. female who presents for chronic condition visit/exam  History of present illness/Additional concerns: Last visit we discussed thumb pain and she was referred to hand specialist.  Wondering about taking sudafed for head congestion - wondering if she can take it if needed. mucinex doesn't do as much for her head congestion. Already doing flonase every day.   Hypertension: Losartan-hydrochlorothiazide (hydrochlorothiazide added 2 months ago due to elevated pressures)  Seasonal allergies: see above. Still taking all the flonase, allegra, nasal saline but still struggles sometimes .  Lower back pain - feels that core is weaker. Seems like when she does a lot of exercise it makes her lower back hurt and she doesn't feel well for a couple of days. Sees chiropractor once a month. Went a couple of weeks ago.   Does ok with pelvic tilt on the floor. Other simple exercises seem to flare it.   Has been trying to cut back on sweets, sugar and has been trying to lose weight. Doing intermittent fasting which she likes.   Past Medical History:  Diagnosis Date  . Allergy   . Arthritis   . Colon polyps   . Hypertension    Past Surgical History:  Procedure Laterality Date  . COLONOSCOPY    . orthoscopy Left 1994   KNEE  . TONSILLECTOMY    . wisdom teeth extraction  1970   No Known Allergies Current Meds  Medication Sig  . BIOTIN PO Take 1 tablet by mouth daily.  . Cholecalciferol (VITAMIN D PO) Take 1,000 Units by mouth daily.   . diphenhydramine-acetaminophen (TYLENOL PM) 25-500 MG TABS tablet Take 1 tablet by mouth at bedtime.  . fexofenadine (ALLEGRA) 60 MG tablet Take 60 mg by mouth daily.  Marland Kitchen losartan-hydrochlorothiazide (HYZAAR) 100-12.5 MG tablet Take 1 tablet by mouth daily.  . LUTEIN PO Take 1 tablet by mouth daily.   Marland Kitchen OVER THE COUNTER MEDICATION Take 1 drop by mouth at bedtime. CBD Oil  . psyllium  (METAMUCIL) 58.6 % powder Take 1 packet by mouth daily.  . Pyridoxine HCl (VITAMIN B-6 PO) Take 1 tablet by mouth daily.   . Turmeric (QC TUMERIC COMPLEX) 500 MG CAPS Take by mouth.  . vitamin E 1000 UNIT capsule Take 1,000 Units by mouth daily.  . [DISCONTINUED] fluticasone (FLONASE) 50 MCG/ACT nasal spray USE 2 SPRAYS IN EACH NOSTRIL ONCE DAILY (Patient taking differently: Place 2 sprays into both nostrils daily. )   Social History   Tobacco Use  . Smoking status: Former Smoker    Types: Cigarettes    Quit date: 01/04/1979    Years since quitting: 41.1  . Smokeless tobacco: Never Used  Substance Use Topics  . Alcohol use: Yes    Alcohol/week: 4.0 standard drinks    Types: 4 Shots of liquor per week   Family History  Problem Relation Age of Onset  . Alzheimer's disease Mother 80       passed at 56  . Diverticulosis Mother   . Heart disease Father 39       CAD, bypass  . Cancer Maternal Grandfather 26       colon cancer  . Migraines Sister   . Esophageal cancer Neg Hx   . Stomach cancer Neg Hx   . Rectal cancer Neg Hx      Review of Systems  Constitutional: Negative for activity change, appetite change, chills, fatigue, fever and unexpected weight change.  HENT: Negative for congestion, ear pain, hearing loss, sinus pressure, sinus pain, sore throat and trouble swallowing.   Eyes: Negative for pain and visual disturbance.  Respiratory: Negative for cough, chest tightness, shortness of breath and wheezing.   Cardiovascular: Negative for chest pain, palpitations and leg swelling.  Gastrointestinal: Negative for abdominal pain, blood in stool, constipation, diarrhea, nausea and vomiting.  Genitourinary: Negative for difficulty urinating and menstrual problem.  Musculoskeletal: Positive for back pain. Negative for arthralgias.  Skin: Negative for rash.  Neurological: Negative for dizziness, weakness, numbness and headaches.  Hematological: Negative for adenopathy. Does not  bruise/bleed easily.  Psychiatric/Behavioral: Negative for sleep disturbance and suicidal ideas. The patient is not nervous/anxious.     CBC:  Lab Results  Component Value Date   WBC 6.2 11/16/2019   HGB 14.0 11/16/2019   HCT 41.0 11/16/2019   MCH 30.3 05/26/2018   MCHC 34.3 11/16/2019   RDW 13.5 11/16/2019   PLT 293.0 11/16/2019   CMP: Lab Results  Component Value Date   NA 139 11/16/2019   K 4.3 11/16/2019   CL 104 11/16/2019   CO2 26 11/16/2019   ANIONGAP 7 05/26/2018   GLUCOSE 104 (H) 11/16/2019   BUN 12 11/16/2019   CREATININE 0.66 11/16/2019   GFRAA >60 05/26/2018   CALCIUM 9.2 11/16/2019   PROT 6.9 11/16/2019   BILITOT 0.7 11/16/2019   ALKPHOS 78 11/16/2019   ALT 17 11/16/2019   AST 17 11/16/2019   LIPID: Lab Results  Component Value Date   CHOL 234 (H) 11/16/2019   TRIG 68.0 11/16/2019   HDL 67.30 11/16/2019   LDLCALC 153 (H) 11/16/2019    Objective:  BP 124/80 (BP Location: Left Arm, Patient Position: Sitting, Cuff Size: Large)   Pulse 80   Temp 98 F (36.7 C) (Oral)   Ht 5' 3.75" (1.619 m)   Wt 192 lb (87.1 kg)   LMP 07/19/1997   SpO2 98%   BMI 33.22 kg/m   Weight: 192 lb (87.1 kg)   BP Readings from Last 3 Encounters:  02/25/20 124/80  12/03/19 140/68  12/03/19 (!) 144/76   Wt Readings from Last 3 Encounters:  02/25/20 192 lb (87.1 kg)  12/03/19 202 lb (91.6 kg)  12/03/19 203 lb 14.4 oz (92.5 kg)    Physical Exam Constitutional:      General: She is not in acute distress.    Appearance: She is well-developed.  HENT:     Head: Normocephalic and atraumatic.     Right Ear: External ear normal.     Left Ear: External ear normal.     Mouth/Throat:     Pharynx: No oropharyngeal exudate.  Eyes:     Conjunctiva/sclera: Conjunctivae normal.     Pupils: Pupils are equal, round, and reactive to light.  Neck:     Thyroid: No thyromegaly.  Cardiovascular:     Rate and Rhythm: Normal rate and regular rhythm.     Heart sounds: Normal  heart sounds. No murmur heard.  No friction rub. No gallop.   Pulmonary:     Effort: Pulmonary effort is normal.     Breath sounds: Normal breath sounds.  Abdominal:     General: Bowel sounds are normal. There is no distension.     Palpations: Abdomen is soft. There is no mass.     Tenderness: There is no abdominal tenderness. There is no guarding.     Hernia: No hernia is present.  Musculoskeletal:  General: No tenderness or deformity. Normal range of motion.     Cervical back: Normal range of motion and neck supple.  Lymphadenopathy:     Cervical: No cervical adenopathy.  Skin:    General: Skin is warm and dry.     Findings: No rash.  Neurological:     Mental Status: She is alert and oriented to person, place, and time.     Deep Tendon Reflexes: Reflexes normal.     Reflex Scores:      Tricep reflexes are 2+ on the right side and 2+ on the left side.      Bicep reflexes are 2+ on the right side and 2+ on the left side.      Brachioradialis reflexes are 2+ on the right side and 2+ on the left side.      Patellar reflexes are 2+ on the right side and 2+ on the left side. Psychiatric:        Speech: Speech normal.        Behavior: Behavior normal.        Thought Content: Thought content normal.     Assessment/Plan: Health Maintenance Due  Topic Date Due  . INFLUENZA VACCINE  02/17/2020   Health Maintenance reviewed.  1. Allergic rhinitis, unspecified seasonality, unspecified trigger Change from fluticasone to combo azelastine-fluticasone - Azelastine-Fluticasone 137-50 MCG/ACT SUSP; Place 2 Act into the nose daily.  Dispense: 23 g; Refill: 5  2. Essential hypertension Improved control.  - Basic metabolic panel; Future  3. Chronic midline low back pain without sciatica Work on home stretches, yoga, core strengthening.   Return in about 6 months (around 08/27/2020) for Chronic condition visit.  Micheline Rough, MD  Due for repeat colonoscopy 01/2021

## 2020-02-26 LAB — BASIC METABOLIC PANEL
BUN: 15 mg/dL (ref 7–25)
CO2: 26 mmol/L (ref 20–32)
Calcium: 9.4 mg/dL (ref 8.6–10.4)
Chloride: 103 mmol/L (ref 98–110)
Creat: 0.78 mg/dL (ref 0.60–0.93)
Glucose, Bld: 105 mg/dL — ABNORMAL HIGH (ref 65–99)
Potassium: 4.2 mmol/L (ref 3.5–5.3)
Sodium: 138 mmol/L (ref 135–146)

## 2020-02-28 ENCOUNTER — Other Ambulatory Visit: Payer: Self-pay | Admitting: Family Medicine

## 2020-02-28 ENCOUNTER — Telehealth: Payer: Self-pay | Admitting: Family Medicine

## 2020-02-28 DIAGNOSIS — J309 Allergic rhinitis, unspecified: Secondary | ICD-10-CM

## 2020-02-28 MED ORDER — AZELASTINE-FLUTICASONE 137-50 MCG/ACT NA SUSP: 2.0000 | Freq: Every day | NASAL | 5 refills | Status: DC

## 2020-02-28 NOTE — Telephone Encounter (Signed)
Great. Thanks. Med sent.

## 2020-02-28 NOTE — Telephone Encounter (Signed)
Spoke with the pt and she stated she did not understand this from the visit and stated she will try the combo.  Message sent to PCP.

## 2020-02-28 NOTE — Telephone Encounter (Signed)
Pt said the pharmacy does not have her medication. Please resend to   [facility], [address] [place] [address]  Phone:  [phone] Fax:  [phone]   Call pt when it is done  [phone]

## 2020-02-28 NOTE — Telephone Encounter (Signed)
Can you confirm with her? I had asked you to cancel the combination nasal spray because I thought she didn't want to take this regularly and wanted to just add in the second spray (astelin) as needed to her regular flonase? I just want to make sure we are all talking about the same meds. It looks like she should still be ok with her bp medication in terms of refills, but wondering if she is referring to that and not nasal spray? I'm ok with either rx being sent!

## 2020-02-29 ENCOUNTER — Other Ambulatory Visit: Payer: Self-pay | Admitting: Family Medicine

## 2020-02-29 DIAGNOSIS — E2839 Other primary ovarian failure: Secondary | ICD-10-CM

## 2020-02-29 DIAGNOSIS — Z1231 Encounter for screening mammogram for malignant neoplasm of breast: Secondary | ICD-10-CM

## 2020-02-29 NOTE — Telephone Encounter (Signed)
Noted  

## 2020-04-19 ENCOUNTER — Encounter: Payer: Self-pay | Admitting: Orthopaedic Surgery

## 2020-05-09 ENCOUNTER — Encounter: Payer: Self-pay | Admitting: Family Medicine

## 2020-05-11 ENCOUNTER — Encounter: Payer: Self-pay | Admitting: Family Medicine

## 2020-05-11 ENCOUNTER — Encounter: Payer: Self-pay | Admitting: Orthopaedic Surgery

## 2020-05-13 ENCOUNTER — Ambulatory Visit: Payer: Medicare Other | Admitting: Orthopaedic Surgery

## 2020-05-20 ENCOUNTER — Other Ambulatory Visit: Payer: Self-pay

## 2020-05-20 ENCOUNTER — Ambulatory Visit (INDEPENDENT_AMBULATORY_CARE_PROVIDER_SITE_OTHER): Payer: Medicare Other

## 2020-05-20 DIAGNOSIS — Z0000 Encounter for general adult medical examination without abnormal findings: Secondary | ICD-10-CM | POA: Diagnosis not present

## 2020-05-20 NOTE — Patient Instructions (Addendum)
[name] , Thank you for taking time to come for your Medicare Wellness Visit. I appreciate your ongoing commitment to your health goals. Please review the following plan we discussed and let me know if I can assist you in the future.   Screening recommendations/referrals: Colonoscopy: Done 02/11/16 Mammogram: Done 04/09/19 Bone Density: Done 01/24/17 Recommended yearly ophthalmology/optometry visit for glaucoma screening and checkup Recommended yearly dental visit for hygiene and checkup  Vaccinations: Influenza vaccine: Up to date Done 04/23/20 Pneumococcal vaccine: Up to date Tdap vaccine: Up to date Shingles vaccine: Completed 3/16 & 01/25/17   Covid-19:Completed 2/1 & 09/21/19  Advanced directives: Please bring a copy of your health care power of attorney and living will to the office at your convenience.  Conditions/risks identified: Lose weight   Next appointment: Follow up in one year for your annual wellness visit    Preventive Care 65 Years and Older, Female Preventive care refers to lifestyle choices and visits with your health care provider that can promote health and wellness. What does preventive care include?  A yearly physical exam. This is also called an annual well check.  Dental exams once or twice a year.  Routine eye exams. Ask your health care provider how often you should have your eyes checked.  Personal lifestyle choices, including:  Daily care of your teeth and gums.  Regular physical activity.  Eating a healthy diet.  Avoiding tobacco and drug use.  Limiting alcohol use.  Practicing safe sex.  Taking low-dose aspirin every day.  Taking vitamin and mineral supplements as recommended by your health care provider. What happens during an annual well check? The services and screenings done by your health care provider during your annual well check will depend on your age, overall health, lifestyle risk factors, and family history of  disease. Counseling  Your health care provider may ask you questions about your:  Alcohol use.  Tobacco use.  Drug use.  Emotional well-being.  Home and relationship well-being.  Sexual activity.  Eating habits.  History of falls.  Memory and ability to understand (cognition).  Work and work Statistician.  Reproductive health. Screening  You may have the following tests or measurements:  Height, weight, and BMI.  Blood pressure.  Lipid and cholesterol levels. These may be checked every 5 years, or more frequently if you are over 27 years old.  Skin check.  Lung cancer screening. You may have this screening every year starting at age 54 if you have a 30-pack-year history of smoking and currently smoke or have quit within the past 15 years.  Fecal occult blood test (FOBT) of the stool. You may have this test every year starting at age 24.  Flexible sigmoidoscopy or colonoscopy. You may have a sigmoidoscopy every 5 years or a colonoscopy every 10 years starting at age 28.  Hepatitis C blood test.  Hepatitis B blood test.  Sexually transmitted disease (STD) testing.  Diabetes screening. This is done by checking your blood sugar (glucose) after you have not eaten for a while (fasting). You may have this done every 1-3 years.  Bone density scan. This is done to screen for osteoporosis. You may have this done starting at age 46.  Mammogram. This may be done every 1-2 years. Talk to your health care provider about how often you should have regular mammograms. Talk with your health care provider about your test results, treatment options, and if necessary, the need for more tests. Vaccines  Your health care provider  may recommend certain vaccines, such as:  Influenza vaccine. This is recommended every year.  Tetanus, diphtheria, and acellular pertussis (Tdap, Td) vaccine. You may need a Td booster every 10 years.  Zoster vaccine. You may need this after age  83.  Pneumococcal 13-valent conjugate (PCV13) vaccine. One dose is recommended after age 14.  Pneumococcal polysaccharide (PPSV23) vaccine. One dose is recommended after age 78. Talk to your health care provider about which screenings and vaccines you need and how often you need them. This information is not intended to replace advice given to you by your health care provider. Make sure you discuss any questions you have with your health care provider. Document Released: 08/01/2015 Document Revised: 03/24/2016 Document Reviewed: 05/06/2015 Elsevier Interactive Patient Education  2017 [employer] Prevention in the Home Falls can cause injuries. They can happen to people of all ages. There are many things you can do to make your home safe and to help prevent falls. What can I do on the outside of my home?  Regularly fix the edges of walkways and driveways and fix any cracks.  Remove anything that might make you trip as you walk through a door, such as a raised step or threshold.  Trim any bushes or trees on the path to your home.  Use bright outdoor lighting.  Clear any walking paths of anything that might make someone trip, such as rocks or tools.  Regularly check to see if handrails are loose or broken. Make sure that both sides of any steps have handrails.  Any raised decks and porches should have guardrails on the edges.  Have any leaves, snow, or ice cleared regularly.  Use sand or salt on walking paths during winter.  Clean up any spills in your garage right away. This includes oil or grease spills. What can I do in the bathroom?  Use night lights.  Install grab bars by the toilet and in the tub and shower. Do not use towel bars as grab bars.  Use non-skid mats or decals in the tub or shower.  If you need to sit down in the shower, use a plastic, non-slip stool.  Keep the floor dry. Clean up any water that spills on the floor as soon as it happens.  Remove  soap buildup in the tub or shower regularly.  Attach bath mats securely with double-sided non-slip rug tape.  Do not have throw rugs and other things on the floor that can make you trip. What can I do in the bedroom?  Use night lights.  Make sure that you have a light by your bed that is easy to reach.  Do not use any sheets or blankets that are too big for your bed. They should not hang down onto the floor.  Have a firm chair that has side arms. You can use this for support while you get dressed.  Do not have throw rugs and other things on the floor that can make you trip. What can I do in the kitchen?  Clean up any spills right away.  Avoid walking on wet floors.  Keep items that you use a lot in easy-to-reach places.  If you need to reach something above you, use a strong step stool that has a grab bar.  Keep electrical cords out of the way.  Do not use floor polish or wax that makes floors slippery. If you must use wax, use non-skid floor wax.  Do not have throw rugs  and other things on the floor that can make you trip. What can I do with my stairs?  Do not leave any items on the stairs.  Make sure that there are handrails on both sides of the stairs and use them. Fix handrails that are broken or loose. Make sure that handrails are as long as the stairways.  Check any carpeting to make sure that it is firmly attached to the stairs. Fix any carpet that is loose or worn.  Avoid having throw rugs at the top or bottom of the stairs. If you do have throw rugs, attach them to the floor with carpet tape.  Make sure that you have a light switch at the top of the stairs and the bottom of the stairs. If you do not have them, ask someone to add them for you. What else can I do to help prevent falls?  Wear shoes that:  Do not have high heels.  Have rubber bottoms.  Are comfortable and fit you well.  Are closed at the toe. Do not wear sandals.  If you use a  stepladder:  Make sure that it is fully opened. Do not climb a closed stepladder.  Make sure that both sides of the stepladder are locked into place.  Ask someone to hold it for you, if possible.  Clearly mark and make sure that you can see:  Any grab bars or handrails.  First and last steps.  Where the edge of each step is.  Use tools that help you move around (mobility aids) if they are needed. These include:  Canes.  Walkers.  Scooters.  Crutches.  Turn on the lights when you go into a dark area. Replace any light bulbs as soon as they burn out.  Set up your furniture so you have a clear path. Avoid moving your furniture around.  If any of your floors are uneven, fix them.  If there are any pets around you, be aware of where they are.  Review your medicines with your doctor. Some medicines can make you feel dizzy. This can increase your chance of falling. Ask your doctor what other things that you can do to help prevent falls. This information is not intended to replace advice given to you by your health care provider. Make sure you discuss any questions you have with your health care provider. Document Released: 05/01/2009 Document Revised: 12/11/2015 Document Reviewed: 08/09/2014 Elsevier Interactive Patient Education  2017 [employer].

## 2020-05-20 NOTE — Progress Notes (Signed)
Virtual Visit via Telephone Note  I connected with  Melinda Ferguson on 05/20/20 at  8:00 AM EDT by telephone and verified that I am speaking with the correct person using two identifiers.  Medicare Annual Wellness visit completed telephonically due to Covid-19 pandemic.   Persons participating in this call: This Health Coach and this patient.   Location: Patient: Home Provider: Office   I discussed the limitations, risks, security and privacy concerns of performing an evaluation and management service by telephone and the availability of in person appointments. The patient expressed understanding and agreed to proceed.  Unable to perform video visit due to video visit attempted and failed and/or patient does not have video capability.   Some vital signs may be absent or patient reported.   Willette Brace, LPN    Subjective:   Melinda Ferguson is a 69 y.o. female who presents for an Initial Medicare Annual Wellness Visit.  Review of Systems     Cardiac Risk Factors include: advanced age (>79men, >57 women);hypertension;obesity (BMI >30kg/m2)     Objective:    There were no vitals filed for this visit. There is no height or weight on file to calculate BMI.  Advanced Directives 05/20/2020 02/11/2016 01/26/2016  Does Patient Have a Medical Advance Directive? Yes Yes Yes  Type of Paramedic of [place];Living will [place];Living will [place] in Chart? No - copy requested - -    Current Medications (verified) Outpatient Encounter Medications as of 05/20/2020  Medication Sig  . Azelastine-Fluticasone 137-50 MCG/ACT SUSP Place 2 Act into the nose daily.  Marland Kitchen BIOTIN PO Take 1 tablet by mouth daily.  . Cholecalciferol (VITAMIN D PO) Take 1,000 Units by mouth daily.   . diphenhydramine-acetaminophen (TYLENOL PM) 25-500 MG TABS tablet Take 1 tablet by mouth at bedtime.  .  Emollient (COLLAGEN EX) Apply topically.  . fexofenadine (ALLEGRA) 60 MG tablet Take 60 mg by mouth daily.  Marland Kitchen losartan-hydrochlorothiazide (HYZAAR) 100-12.5 MG tablet Take 1 tablet by mouth daily.  . LUTEIN PO Take 1 tablet by mouth daily.   . Misc Natural Products (RESVERATROL DIET PO)   . psyllium (METAMUCIL) 58.6 % powder Take 1 packet by mouth daily.  . Pyridoxine HCl (VITAMIN B-6 PO) Take 1 tablet by mouth daily.   . Turmeric (QC TUMERIC COMPLEX) 500 MG CAPS Take by mouth.  . vitamin E 1000 UNIT capsule Take 1,000 Units by mouth daily.  . [DISCONTINUED] Multiple Vitamins-Minerals (WOMENS MULTIVITAMIN + COLLAGEN PO)   . [DISCONTINUED] OVER THE COUNTER MEDICATION Take 1 drop by mouth at bedtime. CBD Oil (Patient not taking: Reported on 05/20/2020)   No facility-administered encounter medications on file as of 05/20/2020.    Allergies (verified) Patient has no known allergies.   History: Past Medical History:  Diagnosis Date  . Allergy   . Arthritis   . Colon polyps   . Hypertension    Past Surgical History:  Procedure Laterality Date  . COLONOSCOPY    . orthoscopy Left 1994   KNEE  . TONSILLECTOMY    . wisdom teeth extraction  1970   Family History  Problem Relation Age of Onset  . Alzheimer's disease Mother 50       passed at 3  . Diverticulosis Mother   . Heart disease Father 8       CAD, bypass  . Cancer Maternal Grandfather 50  colon cancer  . Migraines Sister   . Esophageal cancer Neg Hx   . Stomach cancer Neg Hx   . Rectal cancer Neg Hx    Social History   Socioeconomic History  . Marital status: Married    Spouse name: Not on file  . Number of children: Not on file  . Years of education: Not on file  . Highest education level: Not on file  Occupational History  . Occupation: Multimedia programmer    Comment: retired  Tobacco Use  . Smoking status: Former Smoker    Types: Cigarettes    Quit date: 01/04/1979    Years since quitting: 41.4  .  Smokeless tobacco: Never Used  Substance and Sexual Activity  . Alcohol use: Yes    Alcohol/week: 4.0 standard drinks    Types: 4 Shots of liquor per week  . Drug use: No  . Sexual activity: Yes  Other Topics Concern  . 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 Difficulty of Paying Living Expenses: Not hard at all  Food Insecurity: No Food Insecurity  . Worried About Charity fundraiser in the Last Year: Never true  . Ran Out of Food in the Last Year: Never true  Transportation Needs: No Transportation Needs  . Lack of Transportation (Medical): No  . Lack of Transportation (Non-Medical): No  Physical Activity: Sufficiently Active  . Days of Exercise per Week: 7 days  . Minutes of Exercise per Session: 30 min  Stress: No Stress Concern Present  . Feeling of Stress : Not at all  Social Connections: Moderately Integrated  . Frequency of Communication with Friends and Family: More than three times a week  . Frequency of Social Gatherings with Friends and Family: More than three times a week  . Attends Religious Services: Never  . Active Member of Clubs or Organizations: Yes  . Attends Archivist Meetings: 1 to 4 times per year  . Marital Status: Married    Tobacco Counseling Counseling given: Not Answered   Clinical Intake:  Pre-visit preparation completed: Yes  Pain : No/denies pain     BMI - recorded: 33.23 Nutritional Status: BMI > 30  Obese Nutritional Risks: None Diabetes: No  How often do you need to have someone help you when you read instructions, pamphlets, or other written materials from your doctor or pharmacy?: 1 - Never  Diabetic?No  Interpreter Needed?:  No  Information entered by :: Charlott Rakes, LPN   Activities of Daily Living In your present state of health, do you have any difficulty performing the following activities: 05/20/2020  Hearing? N  Vision? N  Difficulty concentrating or making decisions? N  Walking or climbing stairs? N  Dressing or bathing? N  Doing errands, shopping? N  Preparing Food and eating ? N  Using the Toilet? N  In the past six months, have you accidently leaked urine? N  Do you have problems with loss of bowel control? N  Managing your Medications? N  Managing your Finances? N  Housekeeping or managing your Housekeeping? N  Some recent data  might be hidden    Patient Care Team: [name], MD as PCP - General (Family Medicine) Kem Boroughs, [place] (Obstetrics and Gynecology) Renda Rolls, Jennefer Bravo, MD as Referring Physician (Dermatology)  Indicate any recent Medical Services you may have received from other than [facility] providers in the past year (date may be approximate).     Assessment:   This is a routine wellness examination for [place].  Hearing/Vision screen  Hearing Screening   125Hz  250Hz  500Hz  1000Hz  2000Hz  3000Hz  4000Hz  6000Hz  8000Hz   Right ear:           Left ear:           Comments: Pt denies any difficulty at this time  Vision Screening Comments: Follows up with Dr Melissa Noon for annual eye exams  Dietary issues and exercise activities discussed: Current Exercise Habits: Home exercise routine, Type of exercise: walking, Time (Minutes): 30, Frequency (Times/Week): 7, Weekly Exercise (Minutes/Week): 210  Goals    . Patient Stated     Lose weight      Depression Screen PHQ 2/9 Scores 05/20/2020 02/25/2020 10/29/2015  PHQ - 2 Score 0 0 0    Fall Risk Fall Risk  05/20/2020 02/25/2020 10/29/2015  Falls in the past year? 0 0 No  Number falls in past yr: 0 0 -  Injury with Fall? 0 0 -  Risk for fall due to : Impaired vision - -  Follow up Falls prevention discussed - -     Any stairs in or around the home? Yes  If so, are there any without handrails? No  Home free of loose throw rugs in walkways, pet beds, electrical cords, etc? Yes  Adequate lighting in your home to reduce risk of falls? Yes   ASSISTIVE DEVICES UTILIZED TO PREVENT FALLS:  Life alert? No  Use of a cane, walker or w/c? No  Grab bars in the bathroom? No  Shower chair or bench in shower? No  Elevated toilet seat or a handicapped toilet? No   TIMED UP AND GO:  Was the test performed? No .     Cognitive Function:     6CIT Screen 05/20/2020  What Year? 0 points  What month? 0 points  Count back from 20 0 points  Months in reverse 0 points  Repeat phrase 0 points    Immunizations Immunization History  Administered Date(s) Administered  . Influenza, High Dose Seasonal PF 05/05/2018, 04/18/2019  . Influenza-Unspecified 04/18/2014, 04/23/2020  . Moderna SARS-COVID-2 Vaccination 08/20/2019, 09/21/2019, 05/14/2020  . Pneumococcal Conjugate-13 10/29/2015  . Pneumococcal Polysaccharide-23 01/18/2017  . Tdap 01/04/2012  . Zoster 01/04/2012  . Zoster Recombinat (Shingrix) 10/01/2016, 01/25/2017    TDAP status: Up to date   Flu Vaccine status: Up to date   Pneumococcal vaccine status: Up to date   Covid-19 vaccine status: Completed vaccines  Qualifies for Shingles Vaccine? Yes   Zostavax completed Yes   Shingrix Completed?: Yes  Screening Tests Health Maintenance  Topic Date Due  . COLONOSCOPY  02/10/2021  . MAMMOGRAM  04/08/2021  . TETANUS/TDAP  01/03/2022  . INFLUENZA VACCINE  Completed  . DEXA SCAN  Completed  . COVID-19 Vaccine  Completed  . Hepatitis C Screening  Completed  . PNA vac Low Risk Adult  Completed    Health Maintenance  There are no preventive care reminders to display for this patient.  Colorectal cancer screening: Completed 02/11/16. Repeat every 5 years Mammogram status: Completed 04/09/19. Repeat every year Bone Density status: Completed  01/24/17. Results reflect: Bone density results: NORMAL. Repeat every 2 years. Estrogen deficiency noted    Additional Screening:  Hepatitis C Screening:  Completed 01/18/17  Vision Screening: Recommended annual ophthalmology exams for early detection of glaucoma and other disorders of the eye. Is the patient up to date with their annual eye exam?  Yes  Who is the provider or what is the name of the office in which the patient attends annual eye exams? Dr Delman Cheadle    Dental Screening: Recommended annual dental exams for proper oral hygiene  Community Resource Referral / Chronic Care Management: CRR required this visit?  No   CCM required this visit?  No      Plan:     I have personally reviewed and noted the following in the patient's chart:   . Medical and social history . Use of alcohol, tobacco or illicit drugs  . Current medications and supplements . Functional ability and status . Nutritional status . Physical activity . Advanced directives . List of other physicians . Hospitalizations, surgeries, and ER visits in previous 12 months . Vitals . Screenings to include cognitive, depression, and falls . Referrals and appointments  In addition, I have reviewed and discussed with patient certain preventive protocols, quality metrics, and best practice recommendations. A written personalized care plan for preventive services as well as general preventive health recommendations were provided to patient.     Willette Brace, LPN   31/10/3886   Nurse Notes: None

## 2020-06-03 ENCOUNTER — Ambulatory Visit: Payer: Medicare Other | Admitting: Orthopaedic Surgery

## 2020-06-09 ENCOUNTER — Other Ambulatory Visit: Payer: Medicare Other

## 2020-06-09 ENCOUNTER — Ambulatory Visit: Payer: Medicare Other

## 2020-06-10 ENCOUNTER — Encounter: Payer: Self-pay | Admitting: Orthopaedic Surgery

## 2020-06-10 ENCOUNTER — Ambulatory Visit (INDEPENDENT_AMBULATORY_CARE_PROVIDER_SITE_OTHER): Payer: Medicare Other | Admitting: Orthopaedic Surgery

## 2020-06-10 ENCOUNTER — Other Ambulatory Visit: Payer: Self-pay

## 2020-06-10 ENCOUNTER — Ambulatory Visit
Admission: RE | Admit: 2020-06-10 | Discharge: 2020-06-10 | Disposition: A | Discharge status: Home / Self Care | Payer: Medicare Other | Source: Ambulatory Visit | Attending: Family Medicine | Admitting: Family Medicine

## 2020-06-10 DIAGNOSIS — M65312 Trigger thumb, left thumb: Secondary | ICD-10-CM | POA: Diagnosis not present

## 2020-06-10 DIAGNOSIS — M65311 Trigger thumb, right thumb: Secondary | ICD-10-CM | POA: Diagnosis not present

## 2020-06-10 DIAGNOSIS — M19032 Primary osteoarthritis, left wrist: Secondary | ICD-10-CM

## 2020-06-10 DIAGNOSIS — E2839 Other primary ovarian failure: Secondary | ICD-10-CM

## 2020-06-10 MED ORDER — METHYLPREDNISOLONE ACETATE 40 MG/ML IJ SUSP
20.0000 mg | INTRAMUSCULAR | Status: AC | PRN
  Administered 2020-06-10: 20 mg

## 2020-06-10 MED ORDER — LIDOCAINE HCL 1 % IJ SOLN
0.5000 mL | INTRAMUSCULAR | Status: AC | PRN
  Administered 2020-06-10: .5 mL

## 2020-06-10 NOTE — Progress Notes (Signed)
Office Visit Note   Patient: Melinda Ferguson           Date of Birth: 09-26-1950           MRN: [id number] Visit Date: 06/10/2020              Requested by: [name], MD [place],  [place] [address] PCP: [name], MD   Assessment & Plan: Visit Diagnoses:  1. Trigger thumb, left thumb   2. Localized primary osteoarthritis of carpometacarpal (CMC) joint of left wrist   3. Trigger thumb, right thumb     Plan: Per the patient's request I did inject the A1 pulley of both thumbs and the base of the left basilar thumb joint.  She tolerated these well.  All questions and concerns were answered and addressed.  Follow-up can be as needed.  Follow-Up Instructions: Return if symptoms worsen or fail to improve.   Orders:  No orders of the defined types were placed in this encounter.  No orders of the defined types were placed in this encounter.     Procedures: Hand/UE Inj: R thumb A1 for trigger finger on 06/10/2020 8:51 AM Medications: 0.5 mL lidocaine 1 %; 20 mg methylPREDNISolone acetate 40 MG/ML  Hand/UE Inj on 06/10/2020 8:51 AM Medications: 0.5 mL lidocaine 1 %; 20 mg methylPREDNISolone acetate 40 MG/ML  Hand/UE Inj: L thumb CMC for osteoarthritis on 06/10/2020 8:52 AM Medications: 0.5 mL lidocaine 1 %; 20 mg methylPREDNISolone acetate 40 MG/ML      Clinical Data: No additional findings.   Subjective: No chief complaint on file. The patient is well-known to me.  She has a history of a left trigger thumb and basilar thumb joint arthritis on the left side.  We last injected her about 6 months ago.  She is reporting some pain over the A1 pulley of both her thumbs and the base of the left thumb.  She is requesting injections today.  She has had no other acute change in her medical status and she is not a diabetic.  She did show me a small area at the DIP joint on her ring finger on the right hand that is consistent with arthritic  changes.  She denies any numbness and tingling or any trauma.  HPI  Review of Systems She currently denies any headache, chest pain, shortness of breath, fever, chills, nausea, vomiting  Objective: Vital Signs: LMP 07/19/1997   Physical Exam She is alert and orient x3 and in no acute distress Ortho Exam Examination of the thumb shows pain over the A1 pulley.  There is some slight triggering on the left but not the right.  She also has pain at the base of the left thumb at the [facility] joint. Specialty Comments:  No specialty comments available.  Imaging: No results found.   PMFS History: Patient Active Problem List   Diagnosis Date Noted  . Osteoarthritis of right knee - sees Dr. Wynelle Link 02/19/2014  . HTN (hypertension) 01/01/2013  . Allergic rhinitis 01/01/2013   Past Medical History:  Diagnosis Date  . Allergy   . Arthritis   . Colon polyps   . Hypertension     Family History  Problem Relation Age of Onset  . Alzheimer's disease Mother 40       passed at 44  . Diverticulosis Mother   . Heart disease Father 76       CAD, bypass  . Cancer Maternal Grandfather 70  colon cancer  . Migraines Sister   . Esophageal cancer Neg Hx   . Stomach cancer Neg Hx   . Rectal cancer Neg Hx     Past Surgical History:  Procedure Laterality Date  . COLONOSCOPY    . orthoscopy Left 1994   KNEE  . TONSILLECTOMY    . wisdom teeth extraction  1970   Social History   Occupational History  . Occupation: Multimedia programmer    Comment: retired  Tobacco Use  . Smoking status: Former Smoker    Types: Cigarettes    Quit date: 01/04/1979    Years since quitting: 41.4  . Smokeless tobacco: Never Used  Substance and Sexual Activity  . Alcohol use: Yes    Alcohol/week: 4.0 standard drinks    Types: 4 Shots of liquor per week  . Drug use: No  . Sexual activity: Yes

## 2020-06-16 ENCOUNTER — Ambulatory Visit (INDEPENDENT_AMBULATORY_CARE_PROVIDER_SITE_OTHER): Payer: Medicare Other | Admitting: Family Medicine

## 2020-06-16 ENCOUNTER — Other Ambulatory Visit: Payer: Self-pay

## 2020-06-16 ENCOUNTER — Encounter: Payer: Self-pay | Admitting: Family Medicine

## 2020-06-16 VITALS — HR 66 | Temp 98.1°F | Ht 63.75 in | Wt 193.0 lb

## 2020-06-16 DIAGNOSIS — G5601 Carpal tunnel syndrome, right upper limb: Secondary | ICD-10-CM

## 2020-06-16 DIAGNOSIS — E785 Hyperlipidemia, unspecified: Secondary | ICD-10-CM

## 2020-06-16 DIAGNOSIS — I10 Essential (primary) hypertension: Secondary | ICD-10-CM | POA: Diagnosis not present

## 2020-06-16 DIAGNOSIS — G629 Polyneuropathy, unspecified: Secondary | ICD-10-CM

## 2020-06-16 DIAGNOSIS — E538 Deficiency of other specified B group vitamins: Secondary | ICD-10-CM

## 2020-06-16 MED ORDER — LOSARTAN POTASSIUM-HCTZ 100-12.5 MG PO TABS
1.0000 | ORAL_TABLET | Freq: Every day | ORAL | 1 refills | Status: DC
Start: 2020-06-16 — End: 2020-12-08

## 2020-06-16 NOTE — Patient Instructions (Addendum)
Let me know if symptoms are not improved within 2 weeks. Try wearing brace day/night during this time (fine to remove for hand washing/showering)  Carpal Tunnel Syndrome  Carpal tunnel syndrome is a condition that causes pain in your hand and arm. The carpal tunnel is a narrow area located on the palm side of your wrist. Repeated wrist motion or certain diseases may cause swelling within the tunnel. This swelling pinches the main nerve in the wrist (median nerve). What are the causes? This condition may be caused by:  Repeated wrist motions.  Wrist injuries.  Arthritis.  A cyst or tumor in the carpal tunnel.  Fluid buildup during pregnancy. Sometimes the cause of this condition is not known. What increases the risk? The following factors may make you more likely to develop this condition:  Having a job, such as being a Research scientist (life sciences), that requires you to repeatedly move your wrist in the same motion.  Being a woman.  Having certain conditions, such as: ? Diabetes. ? Obesity. ? An underactive thyroid (hypothyroidism). ? Kidney failure. What are the signs or symptoms? Symptoms of this condition include:  A tingling feeling in your fingers, especially in your thumb, index, and middle fingers.  Tingling or numbness in your hand.  An aching feeling in your entire arm, especially when your wrist and elbow are bent for a long time.  Wrist pain that goes up your arm to your shoulder.  Pain that goes down into your palm or fingers.  A weak feeling in your hands. You may have trouble grabbing and holding items. Your symptoms may feel worse during the night. How is this diagnosed? This condition is diagnosed with a medical history and physical exam. You may also have tests, including:  Electromyogram (EMG). This test measures electrical signals sent by your nerves into the muscles.  Nerve conduction study. This test measures how well electrical signals pass through  your nerves.  Imaging tests, such as X-rays, ultrasound, and MRI. These tests check for possible causes of your condition. How is this treated? This condition may be treated with:  Lifestyle changes. It is important to stop or change the activity that caused your condition.  Doing exercise and activities to strengthen your muscles and bones (physical therapy).  Learning how to use your hand again after diagnosis (occupational therapy).  Medicines for pain and inflammation. This may include medicine that is injected into your wrist.  A wrist splint.  Surgery. Follow these instructions at home: If you have a splint:  Wear the splint as told by your health care provider. Remove it only as told by your health care provider.  Loosen the splint if your fingers tingle, become numb, or turn cold and blue.  Keep the splint clean.  If the splint is not waterproof: ? Do not let it get wet. ? Cover it with a watertight covering when you take a bath or shower. Managing pain, stiffness, and swelling   If directed, put ice on the painful area: ? If you have a removable splint, remove it as told by your health care provider. ? Put ice in a plastic bag. ? Place a towel between your skin and the bag. ? Leave the ice on for 20 minutes, 2-3 times per day. General instructions  Take over-the-counter and prescription medicines only as told by your health care provider.  Rest your wrist from any activity that may be causing your pain. If your condition is work related, talk  with your employer about changes that can be made, such as getting a wrist pad to use while typing.  Do any exercises as told by your health care provider, physical therapist, or occupational therapist.  Keep all follow-up visits as told by your health care provider. This is important. Contact a health care provider if:  You have new symptoms.  Your pain is not controlled with medicines.  Your symptoms get worse. Get  help right away if:  You have severe numbness or tingling in your wrist or hand. Summary  Carpal tunnel syndrome is a condition that causes pain in your hand and arm.  It is usually caused by repeated wrist motions.  Lifestyle changes and medicines are used to treat carpal tunnel syndrome. Surgery may be recommended.  Follow your health care provider's instructions about wearing a splint, resting from activity, keeping follow-up visits, and calling for help. This information is not intended to replace advice given to you by your health care provider. Make sure you discuss any questions you have with your health care provider. Document Revised: 11/11/2017 Document Reviewed: 11/11/2017 Elsevier Patient Education  [place].

## 2020-06-16 NOTE — Progress Notes (Signed)
Melinda Ferguson DOB: [birth year] Encounter date: 06/16/2020  This is a 69 y.o. female who presents with Chief Complaint  Patient presents with  . Hand Pain    patient complains of numbness and tingling sensation bilateral hands, right greater than left for years, noted in the morning when she wakes up, worse past 5-6 weeks, questioned if due overuse of right hand from injection in the left thumb    History of present illness: In last 5-6 years has noted some tingling/numbness in morning; and always associated with arthritis. Then started having a lot of pain with arthritis in left hand especially. Started using right hand more (she is left handed) and noticed that right hand started to get sore. Numbness/tingling/burning really got pronounced and not just momentary in the last 6-7 weeks. Associated with using right hand more. Last Tuesday she got steroid shots in thumbs; tingling is a lot in right hand. Cortisone helped some. Had originally seen Dr. Marciano Sequin for trigger thumb so that's what they thought she was there for. Tip of thumb feels funny all the time - has "smashed" feeling. Palm of hand feels inflammed. Thumb, first, and 4th finger are most notable; minimal in middle finger. Sometimes itching in palm of hand. Has mucous cyst 4th finger. Shot does help; noted benefit in left hand; not much tingling there now. No pain in wrist, elbow, shoulder or neck.   Has also had a little tingling in toes; not daily.   Would wake her at night - burning/unpleasant.  bp at home has been good - in 120's/70's.   No Known Allergies Current Meds  Medication Sig  . Azelastine-Fluticasone 137-50 MCG/ACT SUSP Place 2 Act into the nose daily.  Marland Kitchen BIOTIN PO Take 1 tablet by mouth daily.  . Cholecalciferol (VITAMIN D PO) Take 1,000 Units by mouth daily.   . diphenhydramine-acetaminophen (TYLENOL PM) 25-500 MG TABS tablet Take 1 tablet by mouth at bedtime.  . Emollient (COLLAGEN EX) Apply topically.  .  fexofenadine (ALLEGRA) 60 MG tablet Take 60 mg by mouth daily.  Marland Kitchen losartan-hydrochlorothiazide (HYZAAR) 100-12.5 MG tablet Take 1 tablet by mouth daily.  . LUTEIN PO Take 1 tablet by mouth daily.   . Misc Natural Products (RESVERATROL DIET PO)   . psyllium (METAMUCIL) 58.6 % powder Take 1 packet by mouth daily.  . Pyridoxine HCl (VITAMIN B-6 PO) Take 1 tablet by mouth daily.   . Turmeric (QC TUMERIC COMPLEX) 500 MG CAPS Take by mouth.  . vitamin E 1000 UNIT capsule Take 1,000 Units by mouth daily.    Review of Systems  Constitutional: Negative for chills, fatigue and fever.  Respiratory: Negative for cough, chest tightness, shortness of breath and wheezing.   Cardiovascular: Negative for chest pain, palpitations and leg swelling.  Neurological: Positive for numbness. Negative for dizziness, tremors, weakness and headaches.    Objective:  Pulse 66   Temp 98.1 F (36.7 C) (Oral)   Ht 5' 3.75" (1.619 m)   Wt 193 lb (87.5 kg)   LMP 07/19/1997   BMI 33.39 kg/m   Weight: 193 lb (87.5 kg)   BP Readings from Last 3 Encounters:  02/25/20 124/80  12/03/19 140/68  12/03/19 (!) 144/76   Wt Readings from Last 3 Encounters:  06/16/20 193 lb (87.5 kg)  02/25/20 192 lb (87.1 kg)  12/03/19 202 lb (91.6 kg)    Physical Exam Constitutional:      General: She is not in acute distress.    Appearance: She is  well-developed.  Cardiovascular:     Rate and Rhythm: Normal rate and regular rhythm.     Pulses:          Radial pulses are 2+ on the right side and 2+ on the left side.     Heart sounds: Normal heart sounds. No murmur heard.  No friction rub.     Comments: Hands are warm, well perfused Pulmonary:     Effort: Pulmonary effort is normal. No respiratory distress.     Breath sounds: Normal breath sounds. No wheezing or rales.  Musculoskeletal:     Right lower leg: No edema.     Left lower leg: No edema.     Comments: No pain reproducible in wrists, elbows, shoulder.  Negative  Spurling's.  No pain with range of motion of the neck.  She does have some bilateral trapezius tenderness/muscle spasm to palpation  Neurological:     Mental Status: She is alert and oriented to person, place, and time.     Deep Tendon Reflexes:     Reflex Scores:      Tricep reflexes are 2+ on the right side and 2+ on the left side.      Bicep reflexes are 2+ on the right side and 2+ on the left side.      Brachioradialis reflexes are 2+ on the right side and 2+ on the left side.    Comments: +phalens right hand  Psychiatric:        Behavior: Behavior normal.     Assessment/Plan  1. Neuropathy I do think it is reasonable to obtain some blood work since then she has noticed increase in symptoms.  See below.  2. Primary hypertension Blood pressure well controlled with losartan hydrochlorothiazide combination 100-12 0.5.  Continue this dose. - CBC with Differential/Platelet; Future - Comprehensive metabolic panel; Future - Comprehensive metabolic panel - CBC with Differential/Platelet  3. Carpal tunnel syndrome on right I suspect symptoms are coming from carpal tunnel.  I have given her a brace today in the office to wear for the next couple of weeks.  Let me know if symptoms do not improve with this.  4. Hyperlipidemia  Has been diet controlled. - Lipid panel; Future - TSH; Future - TSH - Lipid panel  5. B12 deficiency We will recheck B12 levels to make sure not contributing to neuropathy. - Vitamin B12; Future - Vitamin B12   Return if symptoms worsen or fail to improve.  Time spent with patient, discussion of symptoms and treatment options for carpal tunnel, exam, and charting 35 minutes total.  Micheline Rough, MD

## 2020-06-17 LAB — LIPID PANEL
Cholesterol: 228 mg/dL — ABNORMAL HIGH (ref <200)
HDL: 67 mg/dL (ref >=50)
LDL Cholesterol (Calc): 139 mg/dL (calc) — ABNORMAL HIGH
Non-HDL Cholesterol (Calc): 161 mg/dL (calc) — ABNORMAL HIGH (ref <130)
Total CHOL/HDL Ratio: 3.4 (calc) (ref <5.0)
Triglycerides: 106 mg/dL (ref <150)

## 2020-06-17 LAB — CBC WITH DIFFERENTIAL/PLATELET
Absolute Monocytes: 584 cells/uL (ref 200–950)
Basophils Absolute: 29 cells/uL (ref 0–200)
Basophils Relative: 0.4 %
Eosinophils Absolute: 168 cells/uL (ref 15–500)
Eosinophils Relative: 2.3 %
HCT: 39.8 % (ref 35.0–45.0)
Hemoglobin: 13.5 g/dL (ref 11.7–15.5)
Lymphs Abs: 2219 cells/uL (ref 850–3900)
MCH: 31.8 pg (ref 27.0–33.0)
MCHC: 33.9 g/dL (ref 32.0–36.0)
MCV: 93.6 fL (ref 80.0–100.0)
MPV: 9.9 fL (ref 7.5–12.5)
Monocytes Relative: 8 %
Neutro Abs: 4300 cells/uL (ref 1500–7800)
Neutrophils Relative %: 58.9 %
Platelets: 303 10*3/uL (ref 140–400)
RBC: 4.25 10*6/uL (ref 3.80–5.10)
RDW: 12.1 % (ref 11.0–15.0)
Total Lymphocyte: 30.4 %
WBC: 7.3 10*3/uL (ref 3.8–10.8)

## 2020-06-17 LAB — COMPREHENSIVE METABOLIC PANEL
AG Ratio: 1.8 (calc) (ref 1.0–2.5)
ALT: 17 U/L (ref 6–29)
AST: 16 U/L (ref 10–35)
Albumin: 4.2 g/dL (ref 3.6–5.1)
Alkaline phosphatase (APISO): 65 U/L (ref 37–153)
BUN: 16 mg/dL (ref 7–25)
CO2: 27 mmol/L (ref 20–32)
Calcium: 9.4 mg/dL (ref 8.6–10.4)
Chloride: 103 mmol/L (ref 98–110)
Creat: 0.73 mg/dL (ref 0.60–0.93)
Globulin: 2.4 g/dL (calc) (ref 1.9–3.7)
Glucose, Bld: 103 mg/dL — ABNORMAL HIGH (ref 65–99)
Potassium: 4.6 mmol/L (ref 3.5–5.3)
Sodium: 139 mmol/L (ref 135–146)
Total Bilirubin: 0.7 mg/dL (ref 0.2–1.2)
Total Protein: 6.6 g/dL (ref 6.1–8.1)

## 2020-06-17 LAB — VITAMIN B12: Vitamin B-12: 509 pg/mL (ref 200–1100)

## 2020-06-17 LAB — TSH: TSH: 3.75 mIU/L (ref 0.40–4.50)

## 2020-07-04 ENCOUNTER — Encounter: Payer: Self-pay | Admitting: Family Medicine

## 2020-07-23 ENCOUNTER — Other Ambulatory Visit: Payer: Self-pay | Admitting: Family Medicine

## 2020-07-23 ENCOUNTER — Ambulatory Visit
Admission: RE | Admit: 2020-07-23 | Discharge: 2020-07-23 | Disposition: A | Discharge status: Home / Self Care | Payer: Medicare Other | Source: Ambulatory Visit | Attending: Family Medicine | Admitting: Family Medicine

## 2020-07-23 ENCOUNTER — Other Ambulatory Visit: Payer: Self-pay

## 2020-07-23 DIAGNOSIS — Z1231 Encounter for screening mammogram for malignant neoplasm of breast: Secondary | ICD-10-CM

## 2020-09-11 ENCOUNTER — Ambulatory Visit: Payer: Medicare Other | Admitting: Family Medicine

## 2020-09-13 ENCOUNTER — Telehealth (INDEPENDENT_AMBULATORY_CARE_PROVIDER_SITE_OTHER): Payer: Medicare PPO | Admitting: Family Medicine

## 2020-09-13 VITALS — BP 126/70

## 2020-09-13 DIAGNOSIS — M25512 Pain in left shoulder: Secondary | ICD-10-CM | POA: Diagnosis not present

## 2020-09-13 MED ORDER — METHOCARBAMOL 500 MG PO TABS: 500.0000 mg | ORAL_TABLET | Freq: Three times a day (TID) | ORAL | 0 refills | Status: DC | PRN

## 2020-09-13 NOTE — Progress Notes (Signed)
[place] [facility] [address], [place], [place] [address] [phone]  Date:  09/13/2020   Name:  Melinda Ferguson   DOB:  1950/08/16   MRN:  [id number]  PCP:  [name], MD    Chief Complaint: Spasms (Onset: 3 weeks , location: left arm )   History of Present Illness:  Melinda Ferguson is a 70 y.o. very pleasant female patient who presents with the following:  Virtual visit today for concern of pain in left arm Primary patient of Dr. Ethlyn Gallery History of hypertension, allergies, arthritis  Patient location is home, provider location is home.  Patient identity is confirmed with 2 factors, she gives consent for virtual visit today.  The patient and myself are present on the visit today  Connected with pt via phone today She notes muscle spasms in her left arm and shoulder- worse yesterday and she was restless over night, did not sleep well last night She has appt to see PCP next month She has noted the "muscle spasm" for 3-4 weeks Tends to occur in her left shoulder and sometimes feels it "in my left side" Typically the pain would last 2-3 minutes, but it was more persistent and lasted longer yesterday and last night No SOB She cannot reproduce her pain by moving her shoulder or arm  She notes she had a similar issue 30 years ago   She has been following her BP and it has been normal No weakness  History of hypertension but no other cardiac disease history   Patient Active Problem List   Diagnosis Date Noted  . Osteoarthritis of right knee - sees Dr. Wynelle Link 02/19/2014  . HTN (hypertension) 01/01/2013  . Allergic rhinitis 01/01/2013    Past Medical History:  Diagnosis Date  . Allergy   . Arthritis   . Colon polyps   . Hypertension     Past Surgical History:  Procedure Laterality Date  . COLONOSCOPY    . orthoscopy Left 1994   KNEE  . TONSILLECTOMY    . wisdom teeth extraction  1970     Social History   Tobacco Use  . Smoking status: Former Smoker    Types: Cigarettes    Quit date: 01/04/1979    Years since quitting: 41.7  . Smokeless tobacco: Never Used  Substance Use Topics  . Alcohol use: Yes    Alcohol/week: 4.0 standard drinks    Types: 4 Shots of liquor per week  . Drug use: No    Family History  Problem Relation Age of Onset  . Alzheimer's disease Mother 23       passed at 60  . Diverticulosis Mother   . Heart disease Father 24       CAD, bypass  . Cancer Maternal Grandfather 26       colon cancer  . Migraines Sister   . Esophageal cancer Neg Hx   . Stomach cancer Neg Hx   . Rectal cancer Neg Hx     No Known Allergies  Medication list has been reviewed and updated.  Current Outpatient Medications on File Prior to Visit  Medication Sig Dispense Refill  . Azelastine-Fluticasone 137-50 MCG/ACT SUSP Place 2 Act into the nose daily. 23 g 5  . BIOTIN PO Take 1 tablet by mouth daily.    . Cholecalciferol (VITAMIN D PO) Take 1,000 Units by mouth daily.     . diphenhydramine-acetaminophen (TYLENOL PM) 25-500  MG TABS tablet Take 1 tablet by mouth at bedtime.    . Emollient (COLLAGEN EX) Apply topically.    . fexofenadine (ALLEGRA) 60 MG tablet Take 60 mg by mouth daily.    Marland Kitchen losartan-hydrochlorothiazide (HYZAAR) 100-12.5 MG tablet Take 1 tablet by mouth daily. 90 tablet 1  . LUTEIN PO Take 1 tablet by mouth daily.     . Misc Natural Products (RESVERATROL DIET PO)     . psyllium (METAMUCIL) 58.6 % powder Take 1 packet by mouth daily.    . Pyridoxine HCl (VITAMIN B-6 PO) Take 1 tablet by mouth daily.     . Turmeric 500 MG CAPS Take by mouth.    . vitamin E 1000 UNIT capsule Take 1,000 Units by mouth daily.     No current facility-administered medications on file prior to visit.    Review of Systems:  As per HPI- otherwise negative.   Physical Examination: Vitals:   09/13/20 0926  BP: 126/70   There were no vitals filed for this  visit. There is no height or weight on file to calculate BMI. Ideal Body Weight:    Spoke with patient on the telephone.  She sounds well, no distress or cough is noted. Blood pressure reviewed as above  Assessment and Plan: Acute pain of left shoulder - Plan: methocarbamol (ROBAXIN) 500 MG tablet  Virtual visit today for concern of left shoulder pain.  Discussed this in detail with patient today.  I explained that while this certainly may be muscle spasm, pain in the left shoulder which is getting worse is also concerning for possible cardiac etiology.  I advised her to be seen right away at an urgent care or the emergency department. We discussed options regarding urgent care or emergency department locations  Assuming this evaluation is reassuring, she may use Robaxin as needed for muscle spasm.  I called in a prescription for her to use.  Advised her it may cause sedation  She is encouraged to contact us with any other concerns  Spoke with patient for 10 minutes today on telephone  Signed Lamar Blinks, MD

## 2020-09-15 ENCOUNTER — Telehealth: Payer: Self-pay | Admitting: *Deleted

## 2020-09-15 NOTE — Telephone Encounter (Signed)
Call Type Triage / Clinical Relationship To Patient Self Return Phone Number [phone] (Primary) Chief Complaint Arm Pain (no known cause) Reason for Call Symptomatic / Request for Health Information Initial Comment Caller states just had virtual appt w/Dr Copland and adv to have EKG today and needs to know where to go. muscle spasms in arms; Translation No Nurse Assessment Nurse: Noberto Retort, RN, Malvin Johns Date/Time (Eastern Time): 09/13/2020 11:35:11 AM Confirm and document reason for call. If symptomatic, describe symptoms. ---Caller states just had virtual appt w/Dr Copland and adv to have EKG today and needs to know where to go. muscle spasms in arms. No new or worsening s/s at this time- denies triage. Does the patient have any new or worsening symptoms? ---No Disp. Time Eilene Ghazi Time) Disposition Final User 09/13/2020 11:37:27 AM Called On-Call Provider Noberto Retort, RN, [facility] 09/13/2020 11:39:11 AM Called On-Call Provider Noberto Retort, RN, Malvin Johns Reason: xxx 09/13/2020 11:39:01 AM Clinical Call Yes Noberto Retort, RN, Mal[facility] Johns Comments User: Marya Landry, RN Date/Time Eilene Ghazi Time): 09/13/2020 11:41:46 AM [facility] [place] [facility] Phone DateTime Result/Outcome Message Type Notes Lamar Blinks- MD [phone] 09/13/2020 11:37:27 AM Called On Call Provider - Reached Doctor Paged Lamar Blinks- MD 09/13/2020 11:38:10 AM Spoke with On Call - General Message Result On call states that she instructed the PT to go to an Urgent care or the ED

## 2020-09-15 NOTE — Telephone Encounter (Signed)
error 

## 2020-09-19 ENCOUNTER — Encounter: Payer: Self-pay | Admitting: Family Medicine

## 2020-09-19 ENCOUNTER — Telehealth (INDEPENDENT_AMBULATORY_CARE_PROVIDER_SITE_OTHER): Payer: Medicare PPO | Admitting: Family Medicine

## 2020-09-19 ENCOUNTER — Ambulatory Visit (INDEPENDENT_AMBULATORY_CARE_PROVIDER_SITE_OTHER): Payer: Medicare PPO

## 2020-09-19 ENCOUNTER — Other Ambulatory Visit: Payer: Self-pay

## 2020-09-19 VITALS — BP 126/71 | Wt 191.0 lb

## 2020-09-19 DIAGNOSIS — M25512 Pain in left shoulder: Secondary | ICD-10-CM | POA: Diagnosis not present

## 2020-09-19 DIAGNOSIS — M62830 Muscle spasm of back: Secondary | ICD-10-CM

## 2020-09-19 MED ORDER — METHOCARBAMOL 500 MG PO TABS
500.0000 mg | ORAL_TABLET | Freq: Three times a day (TID) | ORAL | 1 refills | Status: DC | PRN
Start: 2020-09-19 — End: 2020-11-26

## 2020-09-19 NOTE — Progress Notes (Signed)
Virtual Visit via Video Note  I connected with Melinda Ferguson on 09/19/20 at 11:30 AM EST by a video enabled telemedicine application and verified that I am speaking with the correct person using two identifiers. Video feed not working so visit was completed by telephone.    Location patient: home Location provider: [place], [place] [address] Persons participating in the virtual visit: patient, provider  I discussed the limitations of evaluation and management by telemedicine and the availability of in person appointments. The patient expressed understanding and agreed to proceed.   Melinda Ferguson DOB: [birth year] Encounter date: 09/19/2020  This is a 70 y.o. female who presents with Chief Complaint  Patient presents with  . Arm Pain    Patient complains of muscle spasms in her chest and left shoulder, radiating to the lower arm x1 month, noticed upon waking up, describes sensation of "quivering", seen at an urgent care last Saturday, EKG, labs performed and she did not contact the office for results    History of present illness: About a month ago started waking up with a brief spasm upper back, upper chest, shoulder. Didn't feel like heart event at all. Would get up and it would go away. Sometimes would experience undulating "chill" or spasm sensation - not pain, not sharp, not pleasant. More like involuntary spasm. She was worried about this and called office  But in meanwhile felt like it was happening more frequently and felt "weird" so she went to urgent care. Blood pressure fine during episodes, no palpitations or other cardiac symptoms like shortness of breath. She talked with after care and they wanted her to rule out cardiac, so she went to urgent care.   She was given robaxin at urgent care and this has taken away some of "unpleasantness".   "spasm" sensation feels more in back of neck at this point; since being on robaxin. Sometimes  getting cramp in neck. Sensation is on both sides, L>R and in torso as well - mostly upper back. Tops shoulders, upper back. Not sure what triggered this.   Back issues prevent her from doing strength training exercises. Anything more than walking starts to bother her back - upper and lower.   She has done well with carpal tunnel syndrome that she was having when here last - brace helped. No residual numbness, pain or tingling in hand. Does have some arthritis in hands, but that is stable.   Does feel some stiffness in neck; but feels she is not limited with range of motion. Does see chiropractor and occasionally has adjustments in neck.   Does remember something similar to this happening before and feels it resolved with muscle relaxer. Has noted sad/stressful events prior to this in the past.   Always has this when she wakes up in the morning - then once up and moving around it feels better. Since starting robaxin this sensation is more blunted. Med is written q 8 hours and she is taking it this way to prevent experiencing the symptoms full blown. No visual changes like tremor. She does note tiny tremor on occasion perhaps with holding piece of paper.   Thinks that sx are about 75% better with current robaxin. Generally feels good.   No headaches, no pain in neck/spine.   No Known Allergies Current Meds  Medication Sig  . Azelastine-Fluticasone 137-50 MCG/ACT SUSP Place 2 Act into the nose daily.  Marland Kitchen BIOTIN PO Take 1 tablet by  mouth daily.  . Cholecalciferol (VITAMIN D PO) Take 1,000 Units by mouth daily.   . diphenhydramine-acetaminophen (TYLENOL PM) 25-500 MG TABS tablet Take 1 tablet by mouth at bedtime.  . Emollient (COLLAGEN EX) Apply topically.  . fexofenadine (ALLEGRA) 60 MG tablet Take 60 mg by mouth daily.  Marland Kitchen losartan-hydrochlorothiazide (HYZAAR) 100-12.5 MG tablet Take 1 tablet by mouth daily.  . LUTEIN PO Take 1 tablet by mouth daily.   . methocarbamol (ROBAXIN) 500 MG tablet  Take 1 tablet (500 mg total) by mouth every 8 (eight) hours as needed for muscle spasms.  . Misc Natural Products (RESVERATROL DIET PO)   . psyllium (METAMUCIL) 58.6 % powder Take 1 packet by mouth daily.  . Pyridoxine HCl (VITAMIN B-6 PO) Take 1 tablet by mouth daily.   . Turmeric 500 MG CAPS Take by mouth.  . vitamin E 1000 UNIT capsule Take 1,000 Units by mouth daily.    Review of Systems  Constitutional: Negative for chills, fatigue and fever.  Respiratory: Negative for cough, chest tightness, shortness of breath and wheezing.   Cardiovascular: Negative for chest pain, palpitations and leg swelling.  Musculoskeletal: Positive for neck stiffness. Myalgias: see hpi.  Neurological: Negative for dizziness, light-headedness, numbness and headaches.    Objective:  BP 126/71   Wt 191 lb (86.6 kg)   LMP 07/19/1997   BMI 33.04 kg/m   Weight: 191 lb (86.6 kg)   BP Readings from Last 3 Encounters:  09/19/20 126/71  09/13/20 126/70  02/25/20 124/80   Wt Readings from Last 3 Encounters:  09/19/20 191 lb (86.6 kg)  06/16/20 193 lb (87.5 kg)  02/25/20 192 lb (87.1 kg)    EXAM:  GENERAL: sounds alert, oriented, and in no acute distress  NECK: normal movements of the head and neck  LUNGS: no coughing, shortness of breath during conversation.   PSYCH/NEURO: pleasant and cooperative, no obvious depression or anxiety, speech and thought processing grossly intact   Assessment/Plan  1. Muscle spasm of back Reviewed [facility] note - stable EKG, normal b12, tsh, cmp. She has had good improvement with robaxin. Continue this and we will get xray; follow up pending xray result and report when I check in Monday. She is worried about underlying condition causing symptoms and has some chronic back "weakness". Consider PT.  - DG Cervical Spine Complete; Future - DG Thoracic Spine W/Swimmers; Future - methocarbamol (ROBAXIN) 500 MG tablet; Take 1 tablet (500 mg total) by mouth  every 8 (eight) hours as needed for muscle spasms.  Dispense: 90 tablet; Refill: 1  2. Acute pain of left shoulder See above. - methocarbamol (ROBAXIN) 500 MG tablet; Take 1 tablet (500 mg total) by mouth every 8 (eight) hours as needed for muscle spasms.  Dispense: 90 tablet; Refill: 1     I discussed the assessment and treatment plan with the patient. The patient was provided an opportunity to ask questions and all were answered. The patient agreed with the plan and demonstrated an understanding of the instructions.   The patient was advised to call back or seek an in-person evaluation if the symptoms worsen or if the condition fails to improve as anticipated.  I provided 30 minutes of non-face-to-face time during this encounter.   Micheline Rough, MD

## 2020-09-22 ENCOUNTER — Encounter: Payer: Self-pay | Admitting: Family Medicine

## 2020-09-22 DIAGNOSIS — M549 Dorsalgia, unspecified: Secondary | ICD-10-CM

## 2020-09-22 DIAGNOSIS — M5090 Cervical disc disorder, unspecified, unspecified cervical region: Secondary | ICD-10-CM

## 2020-09-23 ENCOUNTER — Other Ambulatory Visit: Payer: Self-pay | Admitting: Family Medicine

## 2020-09-23 MED ORDER — GABAPENTIN 100 MG PO CAPS: 100.0000 mg | ORAL_CAPSULE | Freq: Every day | ORAL | 3 refills | Status: DC

## 2020-10-02 MED ORDER — CELECOXIB 200 MG PO CAPS: 200.0000 mg | ORAL_CAPSULE | Freq: Two times a day (BID) | ORAL | 0 refills | Status: DC

## 2020-10-14 ENCOUNTER — Encounter: Payer: Self-pay | Admitting: Orthopaedic Surgery

## 2020-10-14 ENCOUNTER — Ambulatory Visit: Payer: Medicare PPO | Admitting: Orthopaedic Surgery

## 2020-10-14 DIAGNOSIS — M542 Cervicalgia: Secondary | ICD-10-CM | POA: Diagnosis not present

## 2020-10-14 NOTE — Progress Notes (Signed)
Office Visit Note   Patient: Melinda Ferguson           Date of Birth: May 09, 1951           MRN: [id number] Visit Date: 10/14/2020              Requested by: [name], MD [name],  [name] [address] PCP: [name], MD   Assessment & Plan: Visit Diagnoses:  1. Cervicalgia     Plan: We will send her to formal physical therapy for neck and thoracic spine to work on range of motion, strengthening, include modalities and home exercise program.  We will see her back in 6 weeks to see what type of response she had to therapy.  Questions were encouraged and answered by Dr. Ninfa Linden myself.  Follow-Up Instructions: Return in about 1 week (around 10/21/2020).   Orders:  No orders of the defined types were placed in this encounter.  No orders of the defined types were placed in this encounter.     Procedures: No procedures performed   Clinical Data: No additional findings.   Subjective: Chief Complaint  Patient presents with  . Neck - Pain  . Middle Back - Pain    HPI  Patient is a 70 year old female well-known to Dr. Ninfa Linden service comes in today with neck and mid back pain and spasm.  She states this started in February without any acute injury.  She notes that the pain was waking her up about it 5 AM daily.  She did have some pain during the day describes it more of a spasm.  However her primary care physician placed her on Robaxin gabapentin and Celebrex which have all helped.  She really feels the gabapentin helped.  She denies any numbness tingling down either arm.  She denies any decreased range of motion in upper extremities but feels as if her hands could have been weak at some point in time.  She has had no physical therapy. Cervical spine films multiple views to include obliques are reviewed.  These show degenerative changes at multiple levels.  At C4-C5 there is osseous foraminal narrowing greater on the left than the  right.  Otherwise no spondylolisthesis.  No acute fractures. Thoracic spine dated 09/21/2020 two-view views shows.  No acute fractures no bony abnormalities.  Mild endplate spurring lower thoracic upper lumbar.   Review of Systems See HPI otherwise negative or noncontributory.  Objective: Vital Signs: LMP 07/19/1997   Physical Exam Constitutional:      Appearance: She is not ill-appearing or diaphoretic.  Cardiovascular:     Pulses: Normal pulses.  Pulmonary:     Effort: Pulmonary effort is normal.  Neurological:     Mental Status: She is alert and oriented to person, place, and time.  Psychiatric:        Mood and Affect: Mood normal.     Ortho Exam Cervical spine full range of motion without pain.  Nontender about the cervical spinal column paraspinous region.  Negative Spurling's.  Upper extremities 5/5 strength throughout against resistance.  No tenderness along medial borders of both scapula.  Full sensation bilateral hands and full motor bilateral hands.  Nontender over the thoracic spinal column.  Specialty Comments:  No specialty comments available.  Imaging: No results found.   PMFS History: Patient Active Problem List   Diagnosis Date Noted  . Osteoarthritis of right knee - sees Dr. Wynelle Link 02/19/2014  .  HTN (hypertension) 01/01/2013  . Allergic rhinitis 01/01/2013   Past Medical History:  Diagnosis Date  . Allergy   . Arthritis   . Colon polyps   . Hypertension     Family History  Problem Relation Age of Onset  . Alzheimer's disease Mother 52       passed at 58  . Diverticulosis Mother   . Heart disease Father 3       CAD, bypass  . Cancer Maternal Grandfather 26       colon cancer  . Migraines Sister   . Esophageal cancer Neg Hx   . Stomach cancer Neg Hx   . Rectal cancer Neg Hx     Past Surgical History:  Procedure Laterality Date  . COLONOSCOPY    . orthoscopy Left 1994   KNEE  . TONSILLECTOMY    . wisdom teeth extraction  1970    Social History   Occupational History  . Occupation: Multimedia programmer    Comment: retired  Tobacco Use  . Smoking status: Former Smoker    Types: Cigarettes    Quit date: 01/04/1979    Years since quitting: 41.8  . Smokeless tobacco: Never Used  Substance and Sexual Activity  . Alcohol use: Yes    Alcohol/week: 4.0 standard drinks    Types: 4 Shots of liquor per week  . Drug use: No  . Sexual activity: Yes

## 2020-10-14 NOTE — Addendum Note (Signed)
Addended by: Elvin So L on: 10/14/2020 10:14 AM   Modules accepted: Orders

## 2020-10-15 ENCOUNTER — Ambulatory Visit: Payer: Medicare PPO | Admitting: Family Medicine

## 2020-10-15 ENCOUNTER — Encounter: Payer: Self-pay | Admitting: Physical Therapy

## 2020-10-15 ENCOUNTER — Ambulatory Visit: Payer: Medicare PPO | Attending: Physician Assistant | Admitting: Physical Therapy

## 2020-10-15 ENCOUNTER — Other Ambulatory Visit: Payer: Self-pay

## 2020-10-15 DIAGNOSIS — M542 Cervicalgia: Secondary | ICD-10-CM | POA: Insufficient documentation

## 2020-10-15 DIAGNOSIS — R252 Cramp and spasm: Secondary | ICD-10-CM | POA: Diagnosis present

## 2020-10-15 NOTE — Patient Instructions (Addendum)
Trigger Point Dry Needling  . What is Trigger Point Dry Needling (DN)? o DN is a physical therapy technique used to treat muscle pain and dysfunction. Specifically, DN helps deactivate muscle trigger points (muscle knots).  o A thin filiform needle is used to penetrate the skin and stimulate the underlying trigger point. The goal is for a local twitch response (LTR) to occur and for the trigger point to relax. No medication of any kind is injected during the procedure.   . What Does Trigger Point Dry Needling Feel Like?  o The procedure feels different for each individual patient. Some patients report that they do not actually feel the needle enter the skin and overall the process is not painful. Very mild bleeding may occur. However, many patients feel a deep cramping in the muscle in which the needle was inserted. This is the local twitch response.   Marland Kitchen How Will I feel after the treatment? o Soreness is normal, and the onset of soreness may not occur for a few hours. Typically this soreness does not last longer than two days.  o Bruising is uncommon, however; ice can be used to decrease any possible bruising.  o In rare cases feeling tired or nauseous after the treatment is normal. In addition, your symptoms may get worse before they get better, this period will typically not last longer than 24 hours.   . What Can I do After My Treatment? o Increase your hydration by drinking more water for the next 24 hours. o You may place ice or heat on the areas treated that have become sore, however, do not use heat on inflamed or bruised areas. Heat often brings more relief post needling. o You can continue your regular activities, but vigorous activity is not recommended initially after the treatment for 24 hours. o DN is best combined with other physical therapy such as strengthening, stretching, and other therapies.   Access Code: [email or web address] URL: [email or web address] Date:  10/15/2020 Prepared by: Everardo All  Exercises Standing Single Arm Shoulder Shrug Circles AROM Backward - 2 x daily - 7 x weekly - 2 sets - 5 reps - 5s hold Seated Neck Sidebending Stretch with Pillow - 2 x daily - 7 x weekly - 1 sets - 2 reps - 20s hold Gentle Levator Scapulae Stretch - 2 x daily - 7 x weekly - 1 sets - 2 reps - 20s hold

## 2020-10-15 NOTE — Therapy (Signed)
[facility] [address] [place], [place], [address] Phone: [phone]   Fax:  [phone]  Physical Therapy Evaluation  Patient Details  Name: Melinda Ferguson MRN: [id number] Date of Birth: [birth year] Referring Provider (PT): Erskine Emery, PA-C   Encounter Date: 10/15/2020   PT End of Session - 10/15/20 0944    Visit Number 1    Number of Visits 8    Date for PT Re-Evaluation 12/12/20    Authorization Type [employer]    Progress Note Due on Visit 10    PT Start Time 0931    PT Stop Time 1015    PT Time Calculation (min) 44 min    Activity Tolerance Patient tolerated treatment well    Behavior During Therapy [facility] for tasks assessed/performed           Past Medical History:  Diagnosis Date  . Allergy   . Arthritis   . Colon polyps   . Hypertension     Past Surgical History:  Procedure Laterality Date  . COLONOSCOPY    . orthoscopy Left 1994   KNEE  . TONSILLECTOMY    . wisdom teeth extraction  1970    There were no vitals filed for this visit.    Subjective Assessment - 10/15/20 0934    Subjective Patient reports sensation changes in chest and shoulder area bilaterally in February. Has since noticed neck pain, shoulder pian, and pain into her torso. She describes the sensations as muscle spasms with a neurological component. PCP prescribed Roboxin which helped but did not resolve the symptoms. Reports that pain is not stabbing or jabbing but there is discomfort. She underwent EKG which was unremarkable. Was then prescribed Gabapentin which made a big difference. Patient states she was able to sleep and symptoms were greatly reduced. Patient reports that she has had similar symptoms approx. 30 years ago. She states that issues eventually resolved with prescription for muscle relaxers. Patient attributes previous episode to muscle strain from lifting weights with weight that were too heavy. States  that yesterday she attempted to push her night stand out of the way and she woke up with increased symptoms.    Pertinent History arthritis, hypertension    How long can you sit comfortably? unlimited    How long can you stand comfortably? unlimited    How long can you walk comfortably? unlimited    Diagnostic tests X-ray; At C4-C5 there is osseous foraminal narrowing greater on the left than the right.  Otherwise no spondylolisthesis.  No acute fractures.  Thoracic spine dated 09/21/2020 two-view views shows.  No acute fractures no bony abnormalities.  Mild endplate spurring lower thoracic upper lumbar.    Patient Stated Goals to not have pain anymore    Currently in Pain? Yes    Pain Location Neck    Pain Orientation Left    Pain Descriptors / Indicators Sore    Pain Type Acute pain    Pain Radiating Towards Lt shoulder    Pain Onset More than a month ago    Aggravating Factors  lifting    Pain Relieving Factors gabapentin    Multiple Pain Sites No              [facility] PT Assessment - 10/15/20 0001      Assessment   Medical Diagnosis M54.2 (ICD-10-CM) - Cervicalgia    Referring Provider (PT) Erskine Emery, PA-C    Onset Date/Surgical Date 08/19/20  Hand Dominance Left    Prior Therapy None      Precautions   Precautions None      Restrictions   Weight Bearing Restrictions No      Balance Screen   Has the patient fallen in the past 6 months No    Has the patient had a decrease in activity level because of a fear of falling?  No    Is the patient reluctant to leave their home because of a fear of falling?  No      Home Ecologist residence    Living Arrangements Spouse/significant other      Prior Function   Level of [place] Retired      Engineer, production Intact      ROM / Strength   AROM / PROM / Strength AROM;Strength      AROM   Overall AROM  Within functional limits for tasks performed     Overall AROM Comments Rt cervical side bend 28 degrees; Lt cervical side bend 30 degrees      Strength   Overall Strength Within functional limits for tasks performed    Overall Strength Comments gross cervical and bil UE strength 5/5      Special Tests    Special Tests Cervical;Rotator Cuff Impingement    Cervical Tests Spurling's;other    Rotator Cuff Impingment tests Michel Bickers test;Empty Can test;Full Can test      Spurling's   Findings Positive    Side Left      other    Findings Negative    Side Left    Comment ULTT      Hawkins-Kennedy test   Findings Negative    Side Left      Empty Can test   Findings Negative    Side Left      Full Can test   Findings Negative    Side Left                      Objective measurements completed on examination: See above findings.               PT Education - 10/15/20 1019    Education Details Access Code: [email or web address]; dry needling handout    Person(s) Educated Patient    Methods Explanation;Demonstration;Verbal cues;Handout    Comprehension Verbalized understanding;Returned demonstration            PT Short Term Goals - 10/15/20 1216      PT SHORT TERM GOAL #1   Title Patient will be independent with HEP for continued progression at home.    Time 4    Period Weeks    Status New    Target Date 11/12/20      PT SHORT TERM GOAL #2   Title Patient will carry 5# using Lt UE and ambulate 10 feet without increased pain for improved ADLs    Time 4    Period Weeks    Status New    Target Date 12/10/20             PT Long Term Goals - 10/15/20 1632      PT LONG TERM GOAL #1   Title Patient will be independent with advanced HEP for long term management of symptoms post D/C.    Time 8    Period Weeks    Status New    Target Date  12/10/20      PT LONG TERM GOAL #2   Title Patient will lift 10# from floor using Lt UE and carry 20 feet without increased pain for improved ADLs     Time 8    Period Weeks    Status New    Target Date 12/10/20      PT LONG TERM GOAL #3   Title FOTO goal    Time 8    Period Weeks    Status New    Target Date 12/10/20      PT LONG TERM GOAL #4   Title Patient will demonstrate 40 degrees cervical side bending bilaterally as an indicator decreased muscle guarding and resolution of symptoms    Baseline Rt 28 degress; Lt 30 degrees    Time 8    Period Weeks    Status New    Target Date 12/10/20                  Plan - 10/15/20 1020    Clinical Impression Statement Patient is a 70 y/o female referred due to cervical pain. Per patient pain radiates into L shoulder and L side torso, as well as chest. Patient has undergone EKG which has ruled out cardiovascular pathology. She states that activity limitations include lifting heavier items and pushing heavy items. She notes increased pain especially when carrying items down by her side. Patient reports that she does not have initial pain when performing these activities but does notice exacerbated pain after. Patient demonstrates cervical ROM impairments as she is unable to perform cervical side bending >30 degrees bilaterally. She exhibits no gross strength impairments. She reports mildly increased symptoms with ULTT in ulnar nerve distribution and Spurlings to the Lt indicating possible radicular involvement. Noting mild forward head posture at rest. Would benefit from continued skilled intervention to address impairments for decreased pain and to more readily complete ADLs.    Personal Factors and Comorbidities Comorbidity 2    Comorbidities arthritis, hypertension    Examination-Activity Limitations Reach Overhead;Carry;Lift    Examination-Participation Restrictions Cleaning    Stability/Clinical Decision Making Stable/Uncomplicated    Clinical Decision Making Low    Rehab Potential Good    PT Frequency 1x / week    PT Duration 8 weeks    PT Treatment/Interventions ADLs/Self Care  Home Management;Cryotherapy;Electrical Stimulation;Iontophoresis 4mg /ml Dexamethasone;Moist Heat;Traction;Functional mobility training;Therapeutic activities;Therapeutic exercise;Neuromuscular re-education;Patient/family education;Manual techniques;Passive range of motion;Dry needling;Taping;Spinal Manipulations;Joint Manipulations    PT Next Visit Plan introduce DN; begin scapular and cervical mobility and strengthening    PT Home Exercise Plan to be established next session    Consulted and Agree with Plan of Care Patient           Patient will benefit from skilled therapeutic intervention in order to improve the following deficits and impairments:  Decreased activity tolerance,Decreased range of motion,Hypomobility,Increased muscle spasms,Postural dysfunction,Pain  Visit Diagnosis: Cramp and spasm  Cervicalgia     Problem List Patient Active Problem List   Diagnosis Date Noted  . Osteoarthritis of right knee - sees Dr. Wynelle Link 02/19/2014  . HTN (hypertension) 01/01/2013  . Allergic rhinitis 01/01/2013   [name], DPT  10/15/20 4:44 PM   [facility] [address], [place], [place], [address] Phone: [phone]   Fax:  [phone]  Name: Melinda Ferguson MRN: [id number] Date of Birth: 08-23-1950

## 2020-10-16 MED ORDER — CELECOXIB 200 MG PO CAPS: 200.0000 mg | ORAL_CAPSULE | Freq: Two times a day (BID) | ORAL | 0 refills | Status: DC | PRN

## 2020-10-16 MED ORDER — AZELASTINE HCL 0.15 % NA SOLN: NASAL | 5 refills | Status: DC

## 2020-10-16 NOTE — Addendum Note (Signed)
Addended by: [name] on: 10/16/2020 09:14 AM   Modules accepted: Orders

## 2020-10-20 DIAGNOSIS — R9431 Abnormal electrocardiogram [ECG] [EKG]: Secondary | ICD-10-CM | POA: Diagnosis not present

## 2020-10-22 ENCOUNTER — Ambulatory Visit: Payer: Medicare PPO | Attending: Physician Assistant | Admitting: Physical Therapy

## 2020-10-22 ENCOUNTER — Encounter: Payer: Self-pay | Admitting: Physical Therapy

## 2020-10-22 ENCOUNTER — Ambulatory Visit: Payer: Medicare PPO | Admitting: Physical Therapy

## 2020-10-22 ENCOUNTER — Other Ambulatory Visit: Payer: Self-pay

## 2020-10-22 DIAGNOSIS — M542 Cervicalgia: Secondary | ICD-10-CM | POA: Insufficient documentation

## 2020-10-22 DIAGNOSIS — R252 Cramp and spasm: Secondary | ICD-10-CM | POA: Diagnosis not present

## 2020-10-22 NOTE — Therapy (Signed)
[facility] [address], [place], [place], [address] Phone: [phone]   Fax:  [phone]  Physical Therapy Treatment  Patient Details  Name: Melinda Ferguson MRN: [id number] Date of Birth: Dec 07, 1950 Referring Provider (PT): Erskine Emery, PA-C   Encounter Date: 10/22/2020   PT End of Session - 10/22/20 0946    Visit Number 2    Number of Visits 8    Date for PT Re-Evaluation 12/12/20    Authorization Type [employer]    Progress Note Due on Visit 10    PT Start Time 0801    PT Stop Time 0845    PT Time Calculation (min) 44 min    Activity Tolerance Patient tolerated treatment well    Behavior During Therapy [facility] for tasks assessed/performed           Past Medical History:  Diagnosis Date  . Allergy   . Arthritis   . Colon polyps   . Hypertension     Past Surgical History:  Procedure Laterality Date  . COLONOSCOPY    . orthoscopy Left 1994   KNEE  . TONSILLECTOMY    . wisdom teeth extraction  1970    There were no vitals filed for this visit.   Subjective Assessment - 10/22/20 0803    Subjective Has been compliant with HEP. Feels that she is more flexible.    Pertinent History arthritis, hypertension    How long can you sit comfortably? unlimited    How long can you stand comfortably? unlimited    How long can you walk comfortably? unlimited    Diagnostic tests X-ray; At C4-C5 there is osseous foraminal narrowing greater on the left than the right.  Otherwise no spondylolisthesis.  No acute fractures.  Thoracic spine dated 09/21/2020 two-view views shows.  No acute fractures no bony abnormalities.  Mild endplate spurring lower thoracic upper lumbar.    Patient Stated Goals to not have pain anymore    Currently in Pain? No/denies    Pain Onset More than a month ago    Pain Frequency Intermittent              [facility] PT Assessment - 10/22/20 0001      AROM   Cervical - Right Side Bend  34    Cervical - Left Side Bend 30                         [facility] Adult PT Treatment/Exercise - 10/22/20 0001      Exercises   Exercises Neck      Neck Exercises: Theraband   Shoulder External Rotation 10 reps    Shoulder External Rotation Limitations yellow tband    Horizontal ABduction 10 reps    Horizontal ABduction Limitations yellow tband      Neck Exercises: Supine   Neck Retraction 10 reps;3 secs    Neck Retraction Limitations with rotation x5 repetitions    Cervical Rotation Limitations AAROM; w/ towel 5 x 5s hold      Modalities   Modalities Moist Heat      Moist Heat Therapy   Number Minutes Moist Heat 5 Minutes    Moist Heat Location Cervical;Shoulder      Manual Therapy   Manual Therapy Soft tissue mobilization    Soft tissue mobilization palpation and skilled assessment of tissues before and during DN            Trigger Point  Dry Needling - 10/22/20 0001    Consent Given? Yes    Education Handout Provided Previously provided    Muscles Treated Head and Neck Upper trapezius;Suboccipitals;Levator scapulae;Cervical multifidi    Other Dry Needling Rt side only    Upper Trapezius Response Twitch reponse elicited;Palpable increased muscle length    Suboccipitals Response Palpable increased muscle length    Levator Scapulae Response Twitch response elicited;Palpable increased muscle length    Cervical multifidi Response Palpable increased muscle length                  PT Short Term Goals - 10/15/20 1216      PT SHORT TERM GOAL #1   Title Patient will be independent with HEP for continued progression at home.    Time 4    Period Weeks    Status New    Target Date 11/12/20      PT SHORT TERM GOAL #2   Title Patient will carry 5# using Lt UE and ambulate 10 feet without increased pain for improved ADLs    Time 4    Period Weeks    Status New    Target Date 12/10/20             PT Long Term Goals - 10/15/20 1632      PT  LONG TERM GOAL #1   Title Patient will be independent with advanced HEP for long term management of symptoms post D/C.    Time 8    Period Weeks    Status New    Target Date 12/10/20      PT LONG TERM GOAL #2   Title Patient will lift 10# from floor using Lt UE and carry 20 feet without increased pain for improved ADLs    Time 8    Period Weeks    Status New    Target Date 12/10/20      PT LONG TERM GOAL #3   Title FOTO goal    Time 8    Period Weeks    Status New    Target Date 12/10/20      PT LONG TERM GOAL #4   Title Patient will demonstrate 40 degrees cervical side bending bilaterally as an indicator decreased muscle guarding and resolution of symptoms    Baseline Rt 28 degress; Lt 30 degrees    Time 8    Period Weeks    Status New    Target Date 12/10/20                 Plan - 10/22/20 0844    Clinical Impression Statement Patient demonstrates improved cervical AROM with regards to Rt lateral flexion. Continues to experience intermittent spasms throughout the day but notices them more upon first waking up in the morning and after performing a lot of lifting and carrying tasks. Notes that she no longer has constant discomfort. Would like to proceed with DN today. Requiring intermittent verbal cuing for decreased scapular elevation with bilateral shoulder ER exercise. Verbal cues for eccentric control when performing bilateral horizontal abduction exercise. Would benefit from continued skilled intervention to address impairments for decreased pain to more readily perform ADLs.    Personal Factors and Comorbidities Comorbidity 2    Comorbidities arthritis, hypertension    Examination-Activity Limitations Reach Overhead;Carry;Lift    Examination-Participation Restrictions Cleaning    Rehab Potential Good    PT Frequency 1x / week    PT Duration 8 weeks    PT Treatment/Interventions  ADLs/Self Care Home Management;Cryotherapy;Electrical Stimulation;Iontophoresis 4mg /ml  Dexamethasone;Moist Heat;Traction;Functional mobility training;Therapeutic activities;Therapeutic exercise;Neuromuscular re-education;Patient/family education;Manual techniques;Passive range of motion;Dry needling;Taping;Spinal Manipulations;Joint Manipulations    PT Next Visit Plan assess response to DN; continue scapular and cervical strengthening and mobility with progression to patient tolerance    PT Home Exercise Plan Access Code: [email or web address]    Consulted and Agree with Plan of Care Patient           Patient will benefit from skilled therapeutic intervention in order to improve the following deficits and impairments:  Decreased activity tolerance,Decreased range of motion,Hypomobility,Increased muscle spasms,Postural dysfunction,Pain  Visit Diagnosis: Cramp and spasm  Cervicalgia     Problem List Patient Active Problem List   Diagnosis Date Noted  . Osteoarthritis of right knee - sees Dr. Wynelle Link 02/19/2014  . HTN (hypertension) 01/01/2013  . Allergic rhinitis 01/01/2013   [name], DPT  10/22/20 9:47 AM   [facility] [address] [place], [place], [address] Phone: [phone]   Fax:  [phone]  Name: CRISLYN WILLBANKS MRN: [id number] Date of Birth: [birth year]

## 2020-10-29 ENCOUNTER — Encounter: Payer: Self-pay | Admitting: Physical Therapy

## 2020-10-29 ENCOUNTER — Other Ambulatory Visit: Payer: Self-pay

## 2020-10-29 ENCOUNTER — Ambulatory Visit: Payer: Medicare PPO | Admitting: Physical Therapy

## 2020-10-29 DIAGNOSIS — R252 Cramp and spasm: Secondary | ICD-10-CM

## 2020-10-29 DIAGNOSIS — M542 Cervicalgia: Secondary | ICD-10-CM

## 2020-10-29 NOTE — Therapy (Signed)
[facility] [address], [place], [place], [address] Phone: [phone]   Fax:  [phone]  Physical Therapy Treatment  Patient Details  Name: Melinda Ferguson MRN: [id number] Date of Birth: 02/15/1951 Referring Provider (PT): Erskine Emery, PA-C   Encounter Date: 10/29/2020   PT End of Session - 10/29/20 1627    Visit Number 3    Number of Visits 8    Date for PT Re-Evaluation 12/12/20    Authorization Type [employer]    PT Start Time 1400    PT Stop Time 1445    PT Time Calculation (min) 45 min    Activity Tolerance Patient tolerated treatment well    Behavior During Therapy [facility] for tasks assessed/performed           Past Medical History:  Diagnosis Date  . Allergy   . Arthritis   . Colon polyps   . Hypertension     Past Surgical History:  Procedure Laterality Date  . COLONOSCOPY    . orthoscopy Left 1994   KNEE  . TONSILLECTOMY    . wisdom teeth extraction  1970    There were no vitals filed for this visit.   Subjective Assessment - 10/29/20 1401    Subjective Reports that she has had spasms since last session. Reports that they seem to coordinate with increaesd labor with bil UE    Pertinent History arthritis, hypertension    How long can you sit comfortably? unlimited    How long can you stand comfortably? unlimited    How long can you walk comfortably? unlimited    Diagnostic tests X-ray; At C4-C5 there is osseous foraminal narrowing greater on the left than the right.  Otherwise no spondylolisthesis.  No acute fractures.  Thoracic spine dated 09/21/2020 two-view views shows.  No acute fractures no bony abnormalities.  Mild endplate spurring lower thoracic upper lumbar.    Currently in Pain? No/denies    Pain Onset More than a month ago                             [facility] Adult PT Treatment/Exercise - 10/29/20 0001      Neck Exercises: Machines for Strengthening    UBE (Upper Arm Bike) lvl 1 2 x 2      Neck Exercises: Theraband   Shoulder External Rotation Red    Shoulder External Rotation Limitations 2x10 repetitions    Horizontal ABduction Red    Horizontal ABduction Limitations 2x10 repetitions      Neck Exercises: Supine   Neck Retraction 10 reps;3 secs    Other Supine Exercise scapular protraction 3#; 2 x 10 repetitions      Manual Therapy   Manual Therapy Soft tissue mobilization    Soft tissue mobilization palpation and skilled assessment of tissues before, during, and after DN            Trigger Point Dry Needling - 10/29/20 0001    Consent Given? Yes    Education Handout Provided Previously provided    Muscles Treated Head and Neck Upper trapezius;Levator scapulae;Cervical multifidi    Muscles Treated Back/Hip Thoracic multifidi    Dry Needling Comments bil    Electrical Stimulation Performed with Dry Needling Yes    Upper Trapezius Response Twitch reponse elicited;Palpable increased muscle length    Levator Scapulae Response Twitch response elicited;Palpable increased muscle length    Cervical multifidi Response Palpable increased  muscle length    Thoracic multifidi response Palpable increased muscle length                  PT Short Term Goals - 10/15/20 1216      PT SHORT TERM GOAL #1   Title Patient will be independent with HEP for continued progression at home.    Time 4    Period Weeks    Status New    Target Date 11/12/20      PT SHORT TERM GOAL #2   Title Patient will carry 5# using Lt UE and ambulate 10 feet without increased pain for improved ADLs    Time 4    Period Weeks    Status New    Target Date 12/10/20             PT Long Term Goals - 10/15/20 1632      PT LONG TERM GOAL #1   Title Patient will be independent with advanced HEP for long term management of symptoms post D/C.    Time 8    Period Weeks    Status New    Target Date 12/10/20      PT LONG TERM GOAL #2   Title Patient  will lift 10# from floor using Lt UE and carry 20 feet without increased pain for improved ADLs    Time 8    Period Weeks    Status New    Target Date 12/10/20      PT LONG TERM GOAL #3   Title FOTO goal    Time 8    Period Weeks    Status New    Target Date 12/10/20      PT LONG TERM GOAL #4   Title Patient will demonstrate 40 degrees cervical side bending bilaterally as an indicator decreased muscle guarding and resolution of symptoms    Baseline Rt 28 degress; Lt 30 degrees    Time 8    Period Weeks    Status New    Target Date 12/10/20                 Plan - 10/29/20 1624    Clinical Impression Statement Patient reports decreased frequency of muscle spasms indicating partial resolution of symptoms. Scapular strength improving as patient able to tolerate increased resistance and repetitions of select exercises. Continue to require tactile cuing to correctly perform cervical retractions. Would benefit from continued skilled intervention to address impairments for decreased pain and improve functional use of bil UE.    Personal Factors and Comorbidities Comorbidity 2    Comorbidities arthritis, hypertension    Examination-Activity Limitations Reach Overhead;Carry;Lift    Examination-Participation Restrictions Cleaning    Rehab Potential Good    PT Frequency 1x / week    PT Duration 8 weeks    PT Treatment/Interventions ADLs/Self Care Home Management;Cryotherapy;Electrical Stimulation;Iontophoresis 4mg /ml Dexamethasone;Moist Heat;Traction;Functional mobility training;Therapeutic activities;Therapeutic exercise;Neuromuscular re-education;Patient/family education;Manual techniques;Passive range of motion;Dry needling;Taping;Spinal Manipulations;Joint Manipulations    PT Next Visit Plan progress postural strengthening and cervical mobility to patient tolerance; add manual techniques for cervical segmental mobility    PT Home Exercise Plan Access Code: [email or web address]    Consulted and  Agree with Plan of Care Patient           Patient will benefit from skilled therapeutic intervention in order to improve the following deficits and impairments:  Decreased activity tolerance,Decreased range of motion,Hypomobility,Increased muscle spasms,Postural dysfunction,Pain  Visit Diagnosis: Cramp and spasm  Cervicalgia  Problem List Patient Active Problem List   Diagnosis Date Noted  . Osteoarthritis of right knee - sees Dr. Wynelle Link 02/19/2014  . HTN (hypertension) 01/01/2013  . Allergic rhinitis 01/01/2013   [name], DPT  10/29/20 4:31 PM   [facility] [address], [place], [place], [address] Phone: [phone]   Fax:  [phone]  Name: BEATRIZ QUINTELA MRN: [id number] Date of Birth: 1951-02-22

## 2020-11-05 ENCOUNTER — Encounter: Payer: Medicare PPO | Admitting: Physical Therapy

## 2020-11-06 ENCOUNTER — Encounter: Payer: Self-pay | Admitting: Physical Therapy

## 2020-11-06 ENCOUNTER — Ambulatory Visit: Payer: Medicare PPO | Admitting: Physical Therapy

## 2020-11-06 ENCOUNTER — Other Ambulatory Visit: Payer: Self-pay

## 2020-11-06 DIAGNOSIS — R252 Cramp and spasm: Secondary | ICD-10-CM

## 2020-11-06 DIAGNOSIS — M542 Cervicalgia: Secondary | ICD-10-CM | POA: Diagnosis not present

## 2020-11-06 NOTE — Patient Instructions (Signed)
Sidelying Open Book Thoracic Lumbar Rotation and Extension - 2 x daily - 7 x weekly - 1 sets - 3 reps - 10s hold Seated Thoracic Lumbar Extension - 2 x daily - 7 x weekly - 1 sets - 3 reps - 10s hold Seated Shoulder Diagonal Pulls with Resistance - 1 x daily - 7 x weekly - 2 sets - 12 reps Low Trap Setting at Wall - 1 x daily - 7 x weekly - 2 sets - 10 reps Shoulder External Rotation and Scapular Retraction with Resistance - 1 x daily - 7 x weekly - 2 sets - 12 reps

## 2020-11-06 NOTE — Therapy (Signed)
[facility] [address] [place], [place], [address] Phone: [phone]   Fax:  [phone]  Physical Therapy Treatment  Patient Details  Name: Melinda Ferguson MRN: [id number] Date of Birth: 1951-02-25 Referring Provider (PT): Erskine Emery, PA-C   Encounter Date: 11/06/2020   PT End of Session - 11/06/20 1148    Visit Number 4    Number of Visits 8    Date for PT Re-Evaluation 12/12/20    Authorization Type [employer]    Progress Note Due on Visit 10    PT Start Time 1105    PT Stop Time 1145    PT Time Calculation (min) 40 min    Activity Tolerance Patient tolerated treatment well;No increased pain    Behavior During Therapy WFL for tasks assessed/performed           Past Medical History:  Diagnosis Date  . Allergy   . Arthritis   . Colon polyps   . Hypertension     Past Surgical History:  Procedure Laterality Date  . COLONOSCOPY    . orthoscopy Left 1994   KNEE  . TONSILLECTOMY    . wisdom teeth extraction  1970    There were no vitals filed for this visit.   Subjective Assessment - 11/06/20 1145    Subjective Continues to have spasms after moderate activity. Feels that they are "electrical" in nature.    Pertinent History arthritis, hypertension    How long can you sit comfortably? unlimited    How long can you stand comfortably? unlimited    How long can you walk comfortably? unlimited    Diagnostic tests X-ray; At C4-C5 there is osseous foraminal narrowing greater on the left than the right.  Otherwise no spondylolisthesis.  No acute fractures.  Thoracic spine dated 09/21/2020 two-view views shows.  No acute fractures no bony abnormalities.  Mild endplate spurring lower thoracic upper lumbar.    Patient Stated Goals to not have pain anymore    Currently in Pain? No/denies    Pain Onset More than a month ago                             [facility] Adult PT  Treatment/Exercise - 11/06/20 0001      Neck Exercises: Machines for Strengthening   UBE (Upper Arm Bike) level 2; 2 x 2      Neck Exercises: Theraband   Shoulder External Rotation Red    Shoulder External Rotation Limitations supine; 2 x 12 reps    Horizontal ABduction Red    Horizontal ABduction Limitations 2 x 12 reps; supine      Neck Exercises: Standing   Other Standing Exercises lower trap setting; 2 x 10 reps      Neck Exercises: Seated   Other Seated Exercise thoracic extension in chair; 5 x 10s hold      Neck Exercises: Supine   Other Supine Exercise sashes; red tband; 2x12 reps each direction      Neck Exercises: Sidelying   Other Sidelying Exercise open books with foam roll; 3 x 10s bil                  PT Education - 11/06/20 1146    Education Details see patient instructions for new exercises added to HEP    Person(s) Educated Patient    Methods Explanation;Demonstration;Tactile cues;Verbal cues;Handout    Comprehension Verbalized understanding;Returned demonstration;Verbal  cues required;Tactile cues required            PT Short Term Goals - 11/06/20 1148      PT SHORT TERM GOAL #1   Title Patient will be independent with HEP for continued progression at home.    Time 4    Period Weeks    Status Achieved    Target Date 11/12/20      PT SHORT TERM GOAL #2   Title Patient will carry 5# using Lt UE and ambulate 10 feet without increased pain for improved ADLs    Time 4    Period Weeks    Status Achieved    Target Date 12/10/20             PT Long Term Goals - 10/15/20 1632      PT LONG TERM GOAL #1   Title Patient will be independent with advanced HEP for long term management of symptoms post D/C.    Time 8    Period Weeks    Status New    Target Date 12/10/20      PT LONG TERM GOAL #2   Title Patient will lift 10# from floor using Lt UE and carry 20 feet without increased pain for improved ADLs    Time 8    Period Weeks    Status  New    Target Date 12/10/20      PT LONG TERM GOAL #3   Title FOTO goal    Time 8    Period Weeks    Status New    Target Date 12/10/20      PT LONG TERM GOAL #4   Title Patient will demonstrate 40 degrees cervical side bending bilaterally as an indicator decreased muscle guarding and resolution of symptoms    Baseline Rt 28 degress; Lt 30 degrees    Time 8    Period Weeks    Status New    Target Date 12/10/20                 Plan - 11/06/20 1146    Clinical Impression Statement Patient continues to have muscle spasms but notes that they occur only after moderate activity. Noting improved scapular control with tband exercises this date. Patient reporting significant fatigue following lower trap setting exercise. Would benefit from continued skilled intervention to address impairments for decreased pain and improve functional use of bil UE.    Personal Factors and Comorbidities Comorbidity 2    Comorbidities arthritis, hypertension    Examination-Activity Limitations Reach Overhead;Carry;Lift    Examination-Participation Restrictions Cleaning    Rehab Potential Good    PT Frequency 1x / week    PT Duration 8 weeks    PT Treatment/Interventions ADLs/Self Care Home Management;Cryotherapy;Electrical Stimulation;Iontophoresis 4mg /ml Dexamethasone;Moist Heat;Traction;Functional mobility training;Therapeutic activities;Therapeutic exercise;Neuromuscular re-education;Patient/family education;Manual techniques;Passive range of motion;Dry needling;Taping;Spinal Manipulations;Joint Manipulations    PT Next Visit Plan continue postural strengthening; add manual mobilizations for tspine    PT Home Exercise Plan Access Code: [email or web address]    Consulted and Agree with Plan of Care Patient           Patient will benefit from skilled therapeutic intervention in order to improve the following deficits and impairments:  Decreased activity tolerance,Decreased range of motion,Hypomobility,Increased  muscle spasms,Postural dysfunction,Pain  Visit Diagnosis: Cramp and spasm  Cervicalgia     Problem List Patient Active Problem List   Diagnosis Date Noted  . Osteoarthritis of right knee - sees Dr. Wynelle Link 02/19/2014  .  HTN (hypertension) 01/01/2013  . Allergic rhinitis 01/01/2013   [name], DPT  11/06/20 11:52 AM    [facility] [address], [place], [place], [address] Phone: [phone]   Fax:  [phone]  Name: TERRICKA ONOFRIO MRN: [id number] Date of Birth: 30-May-1951

## 2020-11-12 ENCOUNTER — Ambulatory Visit: Payer: Medicare PPO | Admitting: Physical Therapy

## 2020-11-12 ENCOUNTER — Other Ambulatory Visit: Payer: Self-pay

## 2020-11-12 DIAGNOSIS — M542 Cervicalgia: Secondary | ICD-10-CM

## 2020-11-12 DIAGNOSIS — R252 Cramp and spasm: Secondary | ICD-10-CM

## 2020-11-12 NOTE — Therapy (Signed)
[facility] [address] [place], [place], [address] Phone: [phone]   Fax:  [phone]  Physical Therapy Treatment  Patient Details  Name: AMYLAH WILL MRN: [id number] Date of Birth: 1950-10-27 Referring Provider (PT): Erskine Emery, PA-C   Encounter Date: 11/12/2020   PT End of Session - 11/12/20 1444    Visit Number 5    Number of Visits 8    Date for PT Re-Evaluation 12/12/20    Authorization Type [employer]    Progress Note Due on Visit 10    PT Start Time 1105   Patient arriving late   PT Stop Time 1145    PT Time Calculation (min) 40 min    Activity Tolerance Patient tolerated treatment well;No increased pain    Behavior During Therapy WFL for tasks assessed/performed           Past Medical History:  Diagnosis Date  . Allergy   . Arthritis   . Colon polyps   . Hypertension     Past Surgical History:  Procedure Laterality Date  . COLONOSCOPY    . orthoscopy Left 1994   KNEE  . TONSILLECTOMY    . wisdom teeth extraction  1970    There were no vitals filed for this visit.   Subjective Assessment - 11/12/20 1446    Subjective Notes decreased occurence of muscle "spasms". Has only experienced them once since last session. Feels that they are related to increased exertion.    Pertinent History arthritis, hypertension    How long can you sit comfortably? unlimited    How long can you stand comfortably? unlimited    How long can you walk comfortably? unlimited    Diagnostic tests X-ray; At C4-C5 there is osseous foraminal narrowing greater on the left than the right.  Otherwise no spondylolisthesis.  No acute fractures.  Thoracic spine dated 09/21/2020 two-view views shows.  No acute fractures no bony abnormalities.  Mild endplate spurring lower thoracic upper lumbar.    Patient Stated Goals to not have pain anymore    Currently in Pain? Yes    Pain Score 2     Pain Location Neck     Pain Orientation Left    Pain Descriptors / Indicators Tightness    Pain Type Chronic pain    Pain Onset More than a month ago    Pain Frequency Intermittent                             [facility] Adult PT Treatment/Exercise - 11/12/20 0001      Neck Exercises: Machines for Strengthening   UBE (Upper Arm Bike) level 2; 2 x 2      Neck Exercises: Theraband   Shoulder External Rotation 10 reps;Green    Shoulder External Rotation Limitations seated    Horizontal ABduction 10 reps;Green    Horizontal ABduction Limitations seated      Neck Exercises: Seated   Other Seated Exercise thoracic extension in chair; 5 x 10s hold    Other Seated Exercise segmental forward flexion x2 x 15s hold each      Shoulder Exercises: Standing   Row Strengthening;Both;10 reps;Weights    Row Weight (lbs) 7    Row Limitations bent over row; x10 repetitions with blue tband    Other Standing Exercises dead lift 10# from foot stool; 2x5 repetitions  PT Education - 11/12/20 1337    Education Details added green tband for external rotation; added bent over row with blue tband to HEP    Person(s) Educated Patient    Methods Explanation;Demonstration;Tactile cues;Verbal cues;Handout    Comprehension Verbalized understanding;Returned demonstration;Verbal cues required;Tactile cues required;Need further instruction            PT Short Term Goals - 11/06/20 1148      PT SHORT TERM GOAL #1   Title Patient will be independent with HEP for continued progression at home.    Time 4    Period Weeks    Status Achieved    Target Date 11/12/20      PT SHORT TERM GOAL #2   Title Patient will carry 5# using Lt UE and ambulate 10 feet without increased pain for improved ADLs    Time 4    Period Weeks    Status Achieved    Target Date 12/10/20             PT Long Term Goals - 11/12/20 1444      PT LONG TERM GOAL #1   Title Patient will be independent with advanced  HEP for long term management of symptoms post D/C.    Time 8    Period Weeks    Status On-going      PT LONG TERM GOAL #2   Title Patient will lift 10# from floor using Lt UE and carry 20 feet without increased pain for improved ADLs    Time 8    Period Weeks    Status On-going   able to lift from calf level using bil UE                Plan - 11/12/20 1438    Clinical Impression Statement Patient reporting significant fatigue with increased resistance of external rotation and horizontal abduction tband exercise. Requiring moderate verbal and tactile cues for continued scapular retraction when performing dead lift activity. Would benefit from continued skilled intervention for improved activity tolerance.    Personal Factors and Comorbidities Comorbidity 2    Comorbidities arthritis, hypertension    Examination-Activity Limitations Reach Overhead;Carry;Lift    Examination-Participation Restrictions Cleaning    Rehab Potential Good    PT Frequency 1x / week    PT Duration 8 weeks    PT Treatment/Interventions ADLs/Self Care Home Management;Cryotherapy;Electrical Stimulation;Iontophoresis 4mg /ml Dexamethasone;Moist Heat;Traction;Functional mobility training;Therapeutic activities;Therapeutic exercise;Neuromuscular re-education;Patient/family education;Manual techniques;Passive range of motion;Dry needling;Taping;Spinal Manipulations;Joint Manipulations    PT Next Visit Plan progress resistance for all exercises to patient tolerance; manual techniques as needed    PT Home Exercise Plan Access Code: [email or web address]    Consulted and Agree with Plan of Care Patient           Patient will benefit from skilled therapeutic intervention in order to improve the following deficits and impairments:  Decreased activity tolerance,Decreased range of motion,Hypomobility,Increased muscle spasms,Postural dysfunction,Pain  Visit Diagnosis: Cramp and spasm  Cervicalgia     Problem List Patient  Active Problem List   Diagnosis Date Noted  . Osteoarthritis of right knee - sees Dr. Wynelle Link 02/19/2014  . HTN (hypertension) 01/01/2013  . Allergic rhinitis 01/01/2013    [name], DPT  11/12/20 2:47 PM    [facility] [address], [place], [place], [address] Phone: [phone]   Fax:  [phone]  Name: DALANEY NEEDLE MRN: [id number] Date of Birth: 02-Jan-1951

## 2020-11-12 NOTE — Patient Instructions (Signed)
Shoulder External Rotation and Scapular Retraction with Resistance - 1 x daily - 7 x weekly - 2 sets - 12 reps Standing Bent Over Shoulder Row with Resistance - 1 x daily - 7 x weekly - 2 sets - 10 reps

## 2020-11-19 ENCOUNTER — Other Ambulatory Visit: Payer: Self-pay

## 2020-11-19 ENCOUNTER — Ambulatory Visit: Payer: Medicare PPO | Attending: Physician Assistant | Admitting: Physical Therapy

## 2020-11-19 ENCOUNTER — Encounter: Payer: Self-pay | Admitting: Physical Therapy

## 2020-11-19 DIAGNOSIS — M542 Cervicalgia: Secondary | ICD-10-CM | POA: Diagnosis not present

## 2020-11-19 DIAGNOSIS — R252 Cramp and spasm: Secondary | ICD-10-CM | POA: Diagnosis not present

## 2020-11-19 NOTE — Therapy (Signed)
[facility] [address], [place], [place], [address] Phone: [phone]   Fax:  [phone]  Physical Therapy Treatment  Patient Details  Name: Melinda Ferguson MRN: [id number] Date of Birth: [birth year] Referring Provider (PT): Erskine Emery, PA-C   Encounter Date: 11/19/2020   PT End of Session - 11/19/20 1327    Visit Number 6    Date for PT Re-Evaluation 12/12/20    Authorization Type [employer]    Progress Note Due on Visit 10    PT Start Time 1017    PT Stop Time 1058    PT Time Calculation (min) 41 min    Activity Tolerance Patient tolerated treatment well;No increased pain    Behavior During Therapy WFL for tasks assessed/performed           Past Medical History:  Diagnosis Date  . Allergy   . Arthritis   . Colon polyps   . Hypertension     Past Surgical History:  Procedure Laterality Date  . COLONOSCOPY    . orthoscopy Left 1994   KNEE  . TONSILLECTOMY    . wisdom teeth extraction  1970    There were no vitals filed for this visit.   Subjective Assessment - 11/19/20 1021    Subjective "Yesterday was not a good day." Patient reports that she did a lot of heavy lifting but did not take take Celebrex or Robaxin. Pain has since resolved today but she did take Celebrex last night before bed.    Pertinent History arthritis, hypertension    How long can you sit comfortably? unlimited    How long can you stand comfortably? unlimited    How long can you walk comfortably? unlimited    Diagnostic tests X-ray; At C4-C5 there is osseous foraminal narrowing greater on the left than the right.  Otherwise no spondylolisthesis.  No acute fractures.  Thoracic spine dated 09/21/2020 two-view views shows.  No acute fractures no bony abnormalities.  Mild endplate spurring lower thoracic upper lumbar.    Patient Stated Goals to not have pain anymore    Currently in Pain? Yes    Pain Score 1     Pain  Location Shoulder    Pain Orientation Left    Pain Descriptors / Indicators Discomfort    Pain Type Chronic pain;Acute pain    Pain Onset Yesterday    Pain Frequency Constant                             [facility] Adult PT Treatment/Exercise - 11/19/20 0001      Neck Exercises: Machines for Strengthening   UBE (Upper Arm Bike) level 2; 2 x 2    Cybex Row 25#; 2 x 10 repetitions; verbal cues for scapular retraction with depression    Lat Pull 30 lbs; 2 x 10 repetitions      Neck Exercises: Sidelying   Other Sidelying Exercise open books with foam roll; 3 x 10s bil      Shoulder Exercises: Supine   Protraction AROM;12 reps    Other Supine Exercises alphabet bil UE      Shoulder Exercises: Standing   Other Standing Exercises prayer stretch at high table; 3 x 10s hold                    PT Short Term Goals - 11/06/20 1148      PT SHORT TERM  GOAL #1   Title Patient will be independent with HEP for continued progression at home.    Time 4    Period Weeks    Status Achieved    Target Date 11/12/20      PT SHORT TERM GOAL #2   Title Patient will carry 5# using Lt UE and ambulate 10 feet without increased pain for improved ADLs    Time 4    Period Weeks    Status Achieved    Target Date 12/10/20             PT Long Term Goals - 11/19/20 1326      PT LONG TERM GOAL #1   Title Patient will be independent with advanced HEP for long term management of symptoms post D/C.    Time 8    Period Weeks    Status On-going                 Plan - 11/19/20 1323    Clinical Impression Statement Patient reporting significant fatigue following lat pull down exercise. Has exhibited increased forward trunk lean in standing and when ambulating over the past two sessions. Will add further core strengthening going forward.    Personal Factors and Comorbidities Comorbidity 2    Comorbidities arthritis, hypertension    Examination-Activity Limitations Reach  Overhead;Carry;Lift    Examination-Participation Restrictions Cleaning;Yard Work    Rehab Potential Good    PT Frequency 1x / week    PT Duration 8 weeks    PT Treatment/Interventions ADLs/Self Care Home Management;Cryotherapy;Electrical Stimulation;Iontophoresis 4mg /ml Dexamethasone;Moist Heat;Traction;Functional mobility training;Therapeutic activities;Therapeutic exercise;Neuromuscular re-education;Patient/family education;Manual techniques;Passive range of motion;Dry needling;Taping;Spinal Manipulations;Joint Manipulations    PT Next Visit Plan continue to progress resistance; postural strengthening; thoracic/lumbar mobility    PT Home Exercise Plan Access Code: [email or web address]    Consulted and Agree with Plan of Care Patient           Patient will benefit from skilled therapeutic intervention in order to improve the following deficits and impairments:  Decreased activity tolerance,Decreased range of motion,Hypomobility,Increased muscle spasms,Postural dysfunction,Pain  Visit Diagnosis: Cramp and spasm  Cervicalgia     Problem List Patient Active Problem List   Diagnosis Date Noted  . Osteoarthritis of right knee - sees Dr. Wynelle Link 02/19/2014  . HTN (hypertension) 01/01/2013  . Allergic rhinitis 01/01/2013   [name], DPT  11/19/20 1:29 PM    [facility] [address], [place], [place], [address] Phone: [phone]   Fax:  [phone]  Name: Melinda Ferguson MRN: [id number] Date of Birth: August 13, 1950

## 2020-11-20 NOTE — Progress Notes (Deleted)
70 y.o. [id number] Married White or Caucasian Not Hispanic or Latino female here for annual exam.      Patient's last menstrual period was 07/19/1997.          Sexually active: Yes. The current method of family planning is post menopausal status.   Exercising: Yes. walking Smoker:  No.  Health Maintenance: Pap: 12/03/19 Neg History of abnormal Pap: No. MMG: 07/23/20  Neg. Density B. BiRads 1 BMD:  06/10/20 osteoporosis  Colonoscopy: 02/11/16 normal  TDaP: 01/04/12 Gardasil: n/a    reports that she quit smoking about 41 years ago. Her smoking use included cigarettes. She has never used smokeless tobacco. She reports current alcohol use of about 4.0 standard drinks of alcohol per week. She reports that she does not use drugs.  Past Medical History:  Diagnosis Date  . Allergy   . Arthritis   . Colon polyps   . Hypertension     Past Surgical History:  Procedure Laterality Date  . COLONOSCOPY    . orthoscopy Left 1994   KNEE  . TONSILLECTOMY    . wisdom teeth extraction  1970    Current Outpatient Medications  Medication Sig Dispense Refill  . Azelastine HCl 0.15 % SOLN 2 actuations in each nostril BID 30 mL 5  . BIOTIN PO Take 1 tablet by mouth daily.    . celecoxib (CELEBREX) 200 MG capsule Take 1 capsule (200 mg total) by mouth 2 (two) times daily as needed. 60 capsule 0  . Cholecalciferol (VITAMIN D PO) Take 1,000 Units by mouth daily.     . diphenhydramine-acetaminophen (TYLENOL PM) 25-500 MG TABS tablet Take 1 tablet by mouth at bedtime.    . Emollient (COLLAGEN EX) Apply topically.    . fexofenadine (ALLEGRA) 60 MG tablet Take 60 mg by mouth daily.    Marland Kitchen gabapentin (NEURONTIN) 100 MG capsule Take 1-3 capsules (100-300 mg total) by mouth at bedtime. 90 capsule 3  . losartan-hydrochlorothiazide (HYZAAR) 100-12.5 MG tablet Take 1 tablet by mouth daily. 90 tablet 1  . LUTEIN PO Take 1 tablet by mouth daily.     . methocarbamol (ROBAXIN) 500 MG tablet Take 1 tablet (500 mg total)  by mouth every 8 (eight) hours as needed for muscle spasms. 90 tablet 1  . Misc Natural Products (RESVERATROL DIET PO)     . psyllium (METAMUCIL) 58.6 % powder Take 1 packet by mouth daily.    . Pyridoxine HCl (VITAMIN B-6 PO) Take 1 tablet by mouth daily.     . Turmeric 500 MG CAPS Take by mouth.    . vitamin E 1000 UNIT capsule Take 1,000 Units by mouth daily.     No current facility-administered medications for this visit.    Family History  Problem Relation Age of Onset  . Alzheimer's disease Mother 42       passed at 34  . Diverticulosis Mother   . Heart disease Father 72       CAD, bypass  . Cancer Maternal Grandfather 26       colon cancer  . Migraines Sister   . Esophageal cancer Neg Hx   . Stomach cancer Neg Hx   . Rectal cancer Neg Hx     Review of Systems  Exam:   LMP 07/19/1997   Weight change: @WEIGHTCHANGE @ Height:      Ht Readings from Last 3 Encounters:  06/16/20 5' 3.75" (1.619 m)  02/25/20 5' 3.75" (1.619 m)  12/03/19 5\' 3"  (1.6 m)  General appearance: alert, cooperative and appears stated age Head: Normocephalic, without obvious abnormality, atraumatic Neck: no adenopathy, supple, symmetrical, trachea midline and thyroid {CHL AMB PHY EX THYROID NORM [phone]::"normal to inspection and palpation"} Lungs: clear to auscultation bilaterally Cardiovascular: regular rate and rhythm Breasts: {Exam; breast:[phone]::"normal appearance, no masses or tenderness"} Abdomen: soft, non-tender; non distended,  no masses,  no organomegaly Extremities: extremities normal, atraumatic, no cyanosis or edema Skin: Skin color, texture, turgor normal. No rashes or lesions Lymph nodes: Cervical, supraclavicular, and axillary nodes normal. No abnormal inguinal nodes palpated Neurologic: Grossly normal   Pelvic: External genitalia:  no lesions              Urethra:  normal appearing urethra with no masses, tenderness or lesions              Bartholins and Skenes:  normal                 Vagina: normal appearing vagina with normal color and discharge, no lesions              Cervix: {CHL AMB PHY EX CERVIX NORM [phone]::"no lesions"}               Bimanual Exam:  Uterus:  {CHL AMB PHY EX UTERUS NORM [phone]::"normal size, contour, position, consistency, mobility, non-tender"}              Adnexa: {CHL AMB PHY EX ADNEXA NO MASS DEFAULT:[phone]::"no mass, fullness, tenderness"}               Rectovaginal: Confirms               Anus:  normal sphincter tone, no lesions  *** chaperoned for the exam.  A:  Well Woman with normal exam  P:

## 2020-11-24 ENCOUNTER — Encounter: Payer: Self-pay | Admitting: Gastroenterology

## 2020-11-24 ENCOUNTER — Other Ambulatory Visit: Payer: Self-pay | Admitting: Family Medicine

## 2020-11-24 DIAGNOSIS — M62830 Muscle spasm of back: Secondary | ICD-10-CM

## 2020-11-24 DIAGNOSIS — M25512 Pain in left shoulder: Secondary | ICD-10-CM

## 2020-11-26 ENCOUNTER — Other Ambulatory Visit: Payer: Self-pay

## 2020-11-26 ENCOUNTER — Ambulatory Visit: Payer: Medicare PPO | Admitting: Physical Therapy

## 2020-11-26 DIAGNOSIS — M542 Cervicalgia: Secondary | ICD-10-CM

## 2020-11-26 DIAGNOSIS — R252 Cramp and spasm: Secondary | ICD-10-CM

## 2020-11-26 NOTE — Therapy (Signed)
[facility] [address] [place], [place], [address] Phone: [phone]   Fax:  [phone]  Physical Therapy Treatment  Patient Details  Name: Melinda Ferguson MRN: [id number] Date of Birth: 17-Aug-1950 Referring Provider (PT): Erskine Emery, PA-C   Encounter Date: 11/26/2020   PT End of Session - 11/26/20 1316    Visit Number 7    Number of Visits 8    Date for PT Re-Evaluation 12/12/20    Authorization Type [employer]    Progress Note Due on Visit 10    PT Start Time [phone]   Patient late   PT Stop Time 1015    PT Time Calculation (min) 33 min    Activity Tolerance Patient tolerated treatment well;No increased pain    Behavior During Therapy WFL for tasks assessed/performed           Past Medical History:  Diagnosis Date  . Allergy   . Arthritis   . Colon polyps   . Hypertension     Past Surgical History:  Procedure Laterality Date  . COLONOSCOPY    . orthoscopy Left 1994   KNEE  . TONSILLECTOMY    . wisdom teeth extraction  1970    There were no vitals filed for this visit.                      [place] Adult PT Treatment/Exercise - 11/26/20 0001      Therapeutic Activites    Therapeutic Activities Lifting    Lifting dumb bell dead lift; 8lbs bil UE; 3 x 5 repetitions      Neck Exercises: Machines for Strengthening   UBE (Upper Arm Bike) level 2; 1.5 min x 1.5 min    Lat Pull 25lbs; 2 x 10 repetitions      Neck Exercises: Standing   Wall Push Ups Limitations 2 x 10 repetitions; verbal cues for neutral c-spine    Other Standing Exercises 1/2 quadruped on mat table; row; 8lbs 2 x 10 bil UE                    PT Short Term Goals - 11/06/20 1148      PT SHORT TERM GOAL #1   Title Patient will be independent with HEP for continued progression at home.    Time 4    Period Weeks    Status Achieved    Target Date 11/12/20      PT SHORT TERM GOAL #2   Title  Patient will carry 5# using Lt UE and ambulate 10 feet without increased pain for improved ADLs    Time 4    Period Weeks    Status Achieved    Target Date 12/10/20             PT Long Term Goals - 11/26/20 1315      PT LONG TERM GOAL #1   Title Patient will be independent with advanced HEP for long term management of symptoms post D/C.    Time 8    Period Weeks    Status On-going      PT LONG TERM GOAL #2   Title Patient will lift 10# from floor using bil UE and carry 20 feet without increased pain for improved ADLs    Baseline goal addended 11/26/2020    Time 8    Period Weeks    Status On-going   8lbs from knee height  Plan - 11/26/20 1227    Clinical Impression Statement Patient reporting no increase in fatigue or muscle spasms at end of session this date. Intermittent verbal and tactile cuing for decreased rounded shoulders during row and dead lift activity. Would benefit from continued skilled intervention to address impairments for improved functional activity tolerance.    Personal Factors and Comorbidities Comorbidity 2    Comorbidities arthritis, hypertension    Examination-Activity Limitations Reach Overhead;Carry;Lift    Examination-Participation Restrictions Cleaning;Yard Work    Rehab Potential Good    PT Frequency 1x / week    PT Duration 8 weeks    PT Treatment/Interventions ADLs/Self Care Home Management;Cryotherapy;Electrical Stimulation;Iontophoresis 4mg /ml Dexamethasone;Moist Heat;Traction;Functional mobility training;Therapeutic activities;Therapeutic exercise;Neuromuscular re-education;Patient/family education;Manual techniques;Passive range of motion;Dry needling;Taping;Spinal Manipulations;Joint Manipulations    PT Next Visit Plan continue functional lifts and carries; postural strengthening; thoracic/lumbar mobility    PT Home Exercise Plan Access Code: [email or web address]    Consulted and Agree with Plan of Care Patient            Patient will benefit from skilled therapeutic intervention in order to improve the following deficits and impairments:  Decreased activity tolerance,Decreased range of motion,Hypomobility,Increased muscle spasms,Postural dysfunction,Pain  Visit Diagnosis: Cramp and spasm  Cervicalgia     Problem List Patient Active Problem List   Diagnosis Date Noted  . Osteoarthritis of right knee - sees Dr. Wynelle Link 02/19/2014  . HTN (hypertension) 01/01/2013  . Allergic rhinitis 01/01/2013   [name], DPT  11/26/20 1:18 PM    [facility] [address], [place], [place], [address] Phone: [phone]   Fax:  [phone]  Name: Melinda Ferguson MRN: [id number] Date of Birth: 07/10/1951

## 2020-12-02 ENCOUNTER — Encounter: Payer: Self-pay | Admitting: Family Medicine

## 2020-12-03 ENCOUNTER — Ambulatory Visit: Payer: Medicare PPO | Admitting: Physical Therapy

## 2020-12-03 ENCOUNTER — Ambulatory Visit: Payer: BC Managed Care – PPO | Admitting: Obstetrics and Gynecology

## 2020-12-03 ENCOUNTER — Other Ambulatory Visit: Payer: Self-pay

## 2020-12-03 DIAGNOSIS — M542 Cervicalgia: Secondary | ICD-10-CM | POA: Diagnosis not present

## 2020-12-03 DIAGNOSIS — R252 Cramp and spasm: Secondary | ICD-10-CM

## 2020-12-03 NOTE — Therapy (Signed)
[facility] [address] [place], [place], [address] Phone: [phone]   Fax:  [phone]  Physical Therapy Treatment  Patient Details  Name: Melinda Ferguson MRN: [id number] Date of Birth: 1950/08/20 Referring Provider (PT): Erskine Emery, PA-C   Encounter Date: 12/03/2020  Progress Note Reporting Period 10/15/2020 to 12/03/2020  See note below for Objective Data and Assessment of Progress/Goals.        PT End of Session - 12/03/20 1629    Visit Number 8    Number of Visits 8    Date for PT Re-Evaluation 01/21/21    Authorization Type [employer]    Progress Note Due on Visit 18    PT Start Time 1102    PT Stop Time 1145    PT Time Calculation (min) 43 min    Activity Tolerance Patient tolerated treatment well;No increased pain    Behavior During Therapy WFL for tasks assessed/performed           Past Medical History:  Diagnosis Date  . Allergy   . Arthritis   . Colon polyps   . Hypertension     Past Surgical History:  Procedure Laterality Date  . COLONOSCOPY    . orthoscopy Left 1994   KNEE  . TONSILLECTOMY    . wisdom teeth extraction  1970    There were no vitals filed for this visit.   Subjective Assessment - 12/03/20 1619    Subjective Continues to have fasiculations with exertion. Would like to eventually not require medication.    Pertinent History arthritis, hypertension    How long can you sit comfortably? unlimited    How long can you stand comfortably? unlimited    How long can you walk comfortably? unlimited    Diagnostic tests X-ray; At C4-C5 there is osseous foraminal narrowing greater on the left than the right.  Otherwise no spondylolisthesis.  No acute fractures.  Thoracic spine dated 09/21/2020 two-view views shows.  No acute fractures no bony abnormalities.  Mild endplate spurring lower thoracic upper lumbar.    Patient Stated Goals to not have pain anymore     Currently in Pain? No/denies    Pain Onset More than a month ago    Pain Frequency Intermittent              [facility] PT Assessment - 12/03/20 0001      Assessment   Medical Diagnosis M54.2 (ICD-10-CM) - Cervicalgia    Referring Provider (PT) Erskine Emery, PA-C    Onset Date/Surgical Date 08/19/20    Hand Dominance Left    Prior Therapy None      Precautions   Precautions None      Restrictions   Weight Bearing Restrictions No      Balance Screen   Has the patient fallen in the past 6 months No    Has the patient had a decrease in activity level because of a fear of falling?  No    Is the patient reluctant to leave their home because of a fear of falling?  No      Home Ecologist residence    Living Arrangements Spouse/significant other      Prior Function   Level of Independence Independent    Vocation Retired      Observation/Other Assessments   Focus on Therapeutic Outcomes (FOTO)  76      Posture/Postural Control   Posture/Postural Control Postural limitations  Postural Limitations Forward head      AROM   Cervical - Right Side Bend 44    Cervical - Left Side Bend 30                         [facility] Adult PT Treatment/Exercise - 12/03/20 0001      Therapeutic Activites    Therapeutic Activities Work Simulation    Work Location manager carries 15lb kb; 2 round trips Rt/Lt      Neck Exercises: Machines for Strengthening   Lat Pull 25lbs; 2 x 10 repetitions      Neck Exercises: Standing   Wall Push Ups Limitations 2 x 10 repetitions; verbal cues for neutral c-spine    Other Standing Exercises 1/2 quadruped on mat table; row; 8lbs 2 x 10 bil UE      Lumbar Exercises: Quadruped   Plank knee plank; 3 x 15 sec hold      Shoulder Exercises: Standing   Extension Both;10 reps    Extension Weight (lbs) 20    Row Both;10 reps    Row Weight (lbs) 25    Row Limitations in partial squat position                   PT Education - 12/03/20 1620    Education Details knee plank    Person(s) Educated Patient    Methods Explanation;Demonstration;Verbal cues;Tactile cues;Handout    Comprehension Verbalized understanding;Returned demonstration;Verbal cues required;Tactile cues required            PT Short Term Goals - 12/03/20 1625      PT SHORT TERM GOAL #1   Title Patient will be independent with HEP for continued progression at home.    Time 4    Period Weeks    Status Achieved    Target Date 11/12/20      PT SHORT TERM GOAL #2   Title Patient will carry 5# using Lt UE and ambulate 10 feet without increased pain for improved ADLs    Time 4    Period Weeks    Status Achieved    Target Date 12/10/20             PT Long Term Goals - 12/03/20 1116      PT LONG TERM GOAL #1   Title Patient will be independent with advanced HEP for long term management of symptoms post D/C.    Time 8    Period Weeks    Status Achieved      PT LONG TERM GOAL #2   Title Patient will lift 10# from floor using bil UE and carry 20 feet without increased pain for improved ADLs    Time 8    Period Weeks    Status Partially Met   Able to carry 15lb but unable to raise from floor level     PT LONG TERM GOAL #3   Title Patient will score 69 or higher on FOTO to indicate improved overall function.    Baseline 62    Time 8    Period Weeks    Status New    Target Date 01/21/21      PT LONG TERM GOAL #4   Title Patient will demonstrate 40 degrees cervical side bending bilaterally as an indicator decreased muscle guarding and resolution of symptoms    Baseline Rt 28 degress; Lt 30 degrees    Time 8    Period Weeks  Status Partially Met   Rt 44; Lt 30                Plan - 12/03/20 1621    Clinical Impression Statement Patient is a 70 y/o female referred due to neck pain. PMH includes arthritis and HTN. She demonstrates significantly improved cervical AROM with regards to  bilateral cervical side bending. However, patient continues to report thoracic fasiculations and discomfort with exertion. Would benefit from continued skilled intervention for improved functional activity tolerance and to more readily perform light and heavy ADLs.    Personal Factors and Comorbidities Comorbidity 2    Comorbidities arthritis, hypertension    Examination-Activity Limitations Reach Overhead;Carry;Lift    Examination-Participation Restrictions Cleaning;Yard Work    Stability/Clinical Decision Making Stable/Uncomplicated    Clinical Decision Making Low    Rehab Potential Good    PT Frequency 1x / week    PT Duration 6 weeks    PT Treatment/Interventions ADLs/Self Care Home Management;Cryotherapy;Electrical Stimulation;Iontophoresis 39m/ml Dexamethasone;Moist Heat;Traction;Functional mobility training;Therapeutic activities;Therapeutic exercise;Neuromuscular re-education;Patient/family education;Manual techniques;Passive range of motion;Dry needling;Taping;Spinal Manipulations;Joint Manipulations    PT Next Visit Plan continue functional lifts and carries; postural strengthening; thoracic/lumbar mobility    PT Home Exercise Plan Access Code: [email or web address]   Consulted and Agree with Plan of Care Patient           Patient will benefit from skilled therapeutic intervention in order to improve the following deficits and impairments:  Decreased activity tolerance,Decreased range of motion,Hypomobility,Increased muscle spasms,Postural dysfunction,Pain  Visit Diagnosis: Cramp and spasm - Plan: PT plan of care cert/re-cert  Cervicalgia - Plan: PT plan of care cert/re-cert     Problem List Patient Active Problem List   Diagnosis Date Noted  . Osteoarthritis of right knee - sees Dr. AWynelle Link08/10/2013  . HTN (hypertension) 01/01/2013  . Allergic rhinitis 01/01/2013   KEverardo AllPT, DPT  12/03/20 4:39 PM     [facility] [address] [place] [address]Phone: [phone]  Fax:  [phone] Name: Melinda ARNTSONMRN: [id number] of Birth: 1June 05, 1952

## 2020-12-03 NOTE — Patient Instructions (Signed)
Access Code: [email or web address] URL: [email or web address] Date: 12/03/2020 Prepared by: Everardo All  Exercises Sidelying Open Book Thoracic Lumbar Rotation and Extension - 2 x daily - 7 x weekly - 1 sets - 3 reps - 10s hold Seated Thoracic Lumbar Extension - 2 x daily - 7 x weekly - 1 sets - 3 reps - 10s hold Standing Bent Over Shoulder Row with Resistance - 1 x daily - 7 x weekly - 2 sets - 10 reps Standing Lat Pull Down with Resistance - Elbows Bent - 1 x daily - 7 x weekly - 2 sets - 10 reps Standing 'L' Stretch at Counter - 1 x daily - 7 x weekly - 1 sets - 3 reps - 10s hold Plank on Knees - 1 x daily - 7 x weekly - 1 sets - 3 reps - 15s hold

## 2020-12-06 ENCOUNTER — Other Ambulatory Visit: Payer: Self-pay | Admitting: Family Medicine

## 2020-12-10 ENCOUNTER — Other Ambulatory Visit: Payer: Self-pay

## 2020-12-10 ENCOUNTER — Ambulatory Visit: Payer: Medicare PPO | Admitting: Physical Therapy

## 2020-12-10 DIAGNOSIS — M542 Cervicalgia: Secondary | ICD-10-CM | POA: Diagnosis not present

## 2020-12-10 DIAGNOSIS — R252 Cramp and spasm: Secondary | ICD-10-CM

## 2020-12-10 NOTE — Patient Instructions (Signed)
Anti rotation press; yellow tband; 15 repetitions

## 2020-12-10 NOTE — Therapy (Signed)
[facility] [address], [place], [place], [address] Phone: [phone]   Fax:  [phone]  Physical Therapy Treatment  Patient Details  Name: Melinda Ferguson MRN: [id number] Date of Birth: [birth year] Referring Provider (PT): Erskine Emery, PA-C   Encounter Date: 12/10/2020   PT End of Session - 12/10/20 1245    Visit Number 9    Date for PT Re-Evaluation 01/21/21    Authorization Type [employer]    Authorization Time Period [employer] approved 6 visits, 12/10/2020-01/21/2021    Authorization - Visit Number 1    Authorization - Number of Visits 6    Progress Note Due on Visit 18    PT Start Time 1016    PT Stop Time 1100    PT Time Calculation (min) 44 min    Activity Tolerance Patient tolerated treatment well;No increased pain    Behavior During Therapy WFL for tasks assessed/performed           Past Medical History:  Diagnosis Date  . Allergy   . Arthritis   . Colon polyps   . Hypertension     Past Surgical History:  Procedure Laterality Date  . COLONOSCOPY    . orthoscopy Left 1994   KNEE  . TONSILLECTOMY    . wisdom teeth extraction  1970    There were no vitals filed for this visit.   Subjective Assessment - 12/10/20 1241    Subjective Had mild fasciculation throughout rib cage yesterday but is unsure why. Grossly feels that she is improving.    Pertinent History arthritis, hypertension    How long can you sit comfortably? unlimited    How long can you stand comfortably? unlimited    How long can you walk comfortably? unlimited    Diagnostic tests X-ray; At C4-C5 there is osseous foraminal narrowing greater on the left than the right.  Otherwise no spondylolisthesis.  No acute fractures.  Thoracic spine dated 09/21/2020 two-view views shows.  No acute fractures no bony abnormalities.  Mild endplate spurring lower thoracic upper lumbar.    Patient Stated Goals to not be reliant on  medication    Currently in Pain? No/denies                             [facility] Adult PT Treatment/Exercise - 12/10/20 0001      Neck Exercises: Machines for Strengthening   UBE (Upper Arm Bike) level 1; 3 min forward/back    Lat Pull 30lb; 2 x 10 repetitions      Neck Exercises: Standing   Wall Push Ups Limitations 2 x 10 repetitions; verbal cues for neutral c-spine   x2 steps back from wall   Other Standing Exercises 1/2 quadruped on mat table; row; 5lbs 2 x 10 bil UE      Lumbar Exercises: Seated   Sit to Stand Limitations 2 x 10 repeptitions; 10lb plate      Lumbar Exercises: Quadruped   Opposite Arm/Leg Raise Right arm/Left leg;Left arm/Right leg;10 reps    Opposite Arm/Leg Raise Limitations bent over high/low table      Shoulder Exercises: Standing   Other Standing Exercises anti rotation press with "stir the pot"; yellow tband; x15 circles Lt/Rt                  PT Education - 12/10/20 1242    Education Details Anti rotation press; yellow tband; 15 repetitions  Person(s) Educated Patient    Methods Explanation;Demonstration;Tactile cues;Verbal cues    Comprehension Verbalized understanding;Returned demonstration;Verbal cues required;Tactile cues required            PT Short Term Goals - 12/03/20 1625      PT SHORT TERM GOAL #1   Title Patient will be independent with HEP for continued progression at home.    Time 4    Period Weeks    Status Achieved    Target Date 11/12/20      PT SHORT TERM GOAL #2   Title Patient will carry 5# using Lt UE and ambulate 10 feet without increased pain for improved ADLs    Time 4    Period Weeks    Status Achieved    Target Date 12/10/20             PT Long Term Goals - 12/10/20 1245      PT LONG TERM GOAL #4   Title Patient will demonstrate 40 degrees cervical side bending bilaterally as an indicator decreased muscle guarding and resolution of symptoms    Baseline Rt 28 degress; Lt 30  degrees    Time 8    Period Weeks    Status Partially Met                 Plan - 12/10/20 1243    Clinical Impression Statement Patient reporting increased fatigue following all core stabilization exercises. Tactile and verbal cues provided for decreased pelvic rotation during bird dog exercise. Would benefit from continued skilled intervention for improved activity tolerance and decreased discomfort.    Personal Factors and Comorbidities Comorbidity 2    Comorbidities arthritis, hypertension    Examination-Activity Limitations Reach Overhead;Carry;Lift    Examination-Participation Restrictions Cleaning;Yard Work    Rehab Potential Good    PT Frequency 1x / week    PT Duration 6 weeks    PT Treatment/Interventions ADLs/Self Care Home Management;Cryotherapy;Electrical Stimulation;Iontophoresis 69m/ml Dexamethasone;Moist Heat;Traction;Functional mobility training;Therapeutic activities;Therapeutic exercise;Neuromuscular re-education;Patient/family education;Manual techniques;Passive range of motion;Dry needling;Taping;Spinal Manipulations;Joint Manipulations    PT Next Visit Plan continue functional lifts and carries; postural strengthening; thoracic/lumbar mobility    PT Home Exercise Plan Access Code: [email or web address]   Consulted and Agree with Plan of Care Patient           Patient will benefit from skilled therapeutic intervention in order to improve the following deficits and impairments:  Decreased activity tolerance,Decreased range of motion,Hypomobility,Increased muscle spasms,Postural dysfunction,Pain  Visit Diagnosis: Cramp and spasm  Cervicalgia     Problem List Patient Active Problem List   Diagnosis Date Noted  . Osteoarthritis of right knee - sees Dr. AWynelle Link08/10/2013  . HTN (hypertension) 01/01/2013  . Allergic rhinitis 01/01/2013    KEverardo AllPT, DPT  12/10/20 12:47 PM    [facility] [address] [place] [address]Phone: [phone]  Fax:  [phone] Name: Melinda FERREBEEMRN: [id number] of Birth: [birth year]

## 2020-12-17 ENCOUNTER — Ambulatory Visit: Payer: Medicare PPO | Attending: Physician Assistant | Admitting: Physical Therapy

## 2020-12-17 ENCOUNTER — Other Ambulatory Visit: Payer: Self-pay

## 2020-12-17 DIAGNOSIS — M542 Cervicalgia: Secondary | ICD-10-CM | POA: Insufficient documentation

## 2020-12-17 DIAGNOSIS — R252 Cramp and spasm: Secondary | ICD-10-CM | POA: Diagnosis not present

## 2020-12-17 NOTE — Therapy (Signed)
[facility] [address] [place], [place], [address] Phone: [phone]   Fax:  [phone]  Physical Therapy Treatment  Patient Details  Name: Melinda Ferguson MRN: [id number] Date of Birth: Apr 25, 1951 Referring Provider (PT): Erskine Emery, PA-C   Encounter Date: 12/17/2020   PT End of Session - 12/17/20 1533    Visit Number 10   progress note completed vist 8   Date for PT Re-Evaluation 01/21/21    Authorization Type [employer]    Authorization Time Period [employer] approved 6 visits, 12/10/2020-01/21/2021    Authorization - Visit Number 2    Authorization - Number of Visits 6    Progress Note Due on Visit 18    PT Start Time 1015    PT Stop Time 1057    PT Time Calculation (min) 42 min    Activity Tolerance Patient tolerated treatment well;No increased pain    Behavior During Therapy WFL for tasks assessed/performed           Past Medical History:  Diagnosis Date  . Allergy   . Arthritis   . Colon polyps   . Hypertension     Past Surgical History:  Procedure Laterality Date  . COLONOSCOPY    . orthoscopy Left 1994   KNEE  . TONSILLECTOMY    . wisdom teeth extraction  1970    There were no vitals filed for this visit.   Subjective Assessment - 12/17/20 1530    Subjective Had mild fasciulations yesterday but other than that she has resumed usual activities with no increased symptoms. Took Celebrex a few days ago due to shoulder pain but this was unrelated.    Pertinent History arthritis, hypertension    How long can you sit comfortably? unlimited    How long can you stand comfortably? unlimited    How long can you walk comfortably? unlimited    Diagnostic tests X-ray; At C4-C5 there is osseous foraminal narrowing greater on the left than the right.  Otherwise no spondylolisthesis.  No acute fractures.  Thoracic spine dated 09/21/2020 two-view views shows.  No acute fractures no bony  abnormalities.  Mild endplate spurring lower thoracic upper lumbar.    Patient Stated Goals to not be reliant on medication    Currently in Pain? No/denies                             [facility] Adult PT Treatment/Exercise - 12/17/20 0001      Therapeutic Activites    Lifting dumb bell dead lift; 2 8lb dumb bells, 2 x 10 repetitions      Neck Exercises: Machines for Strengthening   UBE (Upper Arm Bike) L2; 2.5 min forward/back    Cybex Row 25#; 2 x 12 repetitions    Lat Pull 25lbs; 2 x 12 repetitions      Neck Exercises: Standing   Wall Push Ups Limitations x15 repetitions at counter      Shoulder Exercises: Standing   Row Weight (lbs) bent over row at table; 5lbs; 2 x 12 reps Rt/Lt                    PT Short Term Goals - 12/03/20 1625      PT SHORT TERM GOAL #1   Title Patient will be independent with HEP for continued progression at home.    Time 4    Period Weeks  Status Achieved    Target Date 11/12/20      PT SHORT TERM GOAL #2   Title Patient will carry 5# using Lt UE and ambulate 10 feet without increased pain for improved ADLs    Time 4    Period Weeks    Status Achieved    Target Date 12/10/20             PT Long Term Goals - 12/17/20 1533      PT LONG TERM GOAL #2   Title Patient will lift 10# from floor using bil UE and carry 20 feet without increased pain for improved ADLs    Baseline goal addended 11/26/2020    Time 8    Period Weeks    Status On-going      PT LONG TERM GOAL #4   Title Patient will demonstrate 40 degrees cervical side bending bilaterally as an indicator decreased muscle guarding and resolution of symptoms    Baseline Rt 28 degress; Lt 30 degrees    Time 8    Period Weeks    Status On-going                 Plan - 12/17/20 1530    Clinical Impression Statement Patient demonstrates improved strength of postural musculature as she exhibited improved scapular activation when performing partial  dead lift activity. Continues to require intermittent tactile cuing for decreased scapular elevation when performing bent over row exercise. Would benefit from continued skilled intervention to address impairments for continued improvement in functional activity tolerance and to more readily perform heavy ADLs.    Personal Factors and Comorbidities Comorbidity 2    Comorbidities arthritis, hypertension    Examination-Activity Limitations Reach Overhead;Carry;Lift    Examination-Participation Restrictions Cleaning;Yard Work    Rehab Potential Good    PT Frequency 1x / week    PT Duration 6 weeks    PT Treatment/Interventions ADLs/Self Care Home Management;Cryotherapy;Electrical Stimulation;Iontophoresis 4mg /ml Dexamethasone;Moist Heat;Traction;Functional mobility training;Therapeutic activities;Therapeutic exercise;Neuromuscular re-education;Patient/family education;Manual techniques;Passive range of motion;Dry needling;Taping;Spinal Manipulations;Joint Manipulations    PT Next Visit Plan progress functional lifts and carries; finalize final HEP    PT Home Exercise Plan Access Code: [email or web address]    Consulted and Agree with Plan of Care Patient           Patient will benefit from skilled therapeutic intervention in order to improve the following deficits and impairments:  Decreased activity tolerance,Decreased range of motion,Hypomobility,Increased muscle spasms,Postural dysfunction,Pain  Visit Diagnosis: Cramp and spasm  Cervicalgia     Problem List Patient Active Problem List   Diagnosis Date Noted  . Osteoarthritis of right knee - sees Dr. Wynelle Link 02/19/2014  . HTN (hypertension) 01/01/2013  . Allergic rhinitis 01/01/2013    [name], DPT  12/17/20 3:55 PM  [facility] [address] [place], [place], [address] Phone: [phone]   Fax:  [phone]  Name: Melinda Ferguson MRN: [id number] Date of Birth:  1951-04-02

## 2020-12-23 ENCOUNTER — Ambulatory Visit: Payer: Medicare PPO | Admitting: Physical Therapy

## 2020-12-23 ENCOUNTER — Other Ambulatory Visit: Payer: Self-pay

## 2020-12-23 DIAGNOSIS — M542 Cervicalgia: Secondary | ICD-10-CM

## 2020-12-23 DIAGNOSIS — R252 Cramp and spasm: Secondary | ICD-10-CM | POA: Diagnosis not present

## 2020-12-23 NOTE — Therapy (Signed)
[facility] [address] [place], [place], [address] Phone: [phone]   Fax:  [phone]  Physical Therapy Treatment  Patient Details  Name: Melinda Ferguson MRN: [id number] Date of Birth: Sep 20, 1950 Referring Provider (PT): Erskine Emery, PA-C   Encounter Date: 12/23/2020   PT End of Session - 12/23/20 1610    Visit Number 11    Date for PT Re-Evaluation 01/21/21    Authorization Type [employer]    Authorization Time Period [employer] approved 6 visits, 12/10/2020-01/21/2021    Authorization - Visit Number 3    Authorization - Number of Visits 6    Progress Note Due on Visit 18    PT Start Time 9458    PT Stop Time 1315    PT Time Calculation (min) 42 min    Activity Tolerance Patient tolerated treatment well;No increased pain    Behavior During Therapy WFL for tasks assessed/performed           Past Medical History:  Diagnosis Date  . Allergy   . Arthritis   . Colon polyps   . Hypertension     Past Surgical History:  Procedure Laterality Date  . COLONOSCOPY    . orthoscopy Left 1994   KNEE  . TONSILLECTOMY    . wisdom teeth extraction  1970    There were no vitals filed for this visit.   Subjective Assessment - 12/23/20 1606    Subjective Had some fasiculations yesterday but continues to be able to perform usual activities.    Pertinent History arthritis, hypertension    How long can you sit comfortably? unlimited    How long can you stand comfortably? unlimited    Diagnostic tests X-ray; At C4-C5 there is osseous foraminal narrowing greater on the left than the right.  Otherwise no spondylolisthesis.  No acute fractures.  Thoracic spine dated 09/21/2020 two-view views shows.  No acute fractures no bony abnormalities.  Mild endplate spurring lower thoracic upper lumbar.    Patient Stated Goals to not be reliant on medication    Currently in Pain? No/denies                              [facility] Adult PT Treatment/Exercise - 12/23/20 0001      Therapeutic Activites    Lifting partial dead lift 10lb kettlebell; 2 x 5 reps      Neck Exercises: Machines for Strengthening   UBE (Upper Arm Bike) L2; 2.5 min forward/back      Lumbar Exercises: Quadruped   Opposite Arm/Leg Raise Right arm/Left leg;Left arm/Right leg;10 reps    Opposite Arm/Leg Raise Limitations standing R UE/L LE; L UE/R LE x 10 repetitions      Knee/Hip Exercises: Standing   Other Standing Knee Exercises resisted walking; power tower 20lbs; fwd/Rt/Lt/back x3 repetitions                    PT Short Term Goals - 12/03/20 1625      PT SHORT TERM GOAL #1   Title Patient will be independent with HEP for continued progression at home.    Time 4    Period Weeks    Status Achieved    Target Date 11/12/20      PT SHORT TERM GOAL #2   Title Patient will carry 5# using Lt UE and ambulate 10 feet without increased pain for improved ADLs  Time 4    Period Weeks    Status Achieved    Target Date 12/10/20             PT Long Term Goals - 12/23/20 1609      PT LONG TERM GOAL #2   Title Patient will lift 10# from floor using bil UE and carry 20 feet without increased pain for improved ADLs    Baseline goal addended 11/26/2020    Time 8    Period Weeks    Status On-going                 Plan - 12/23/20 1607    Clinical Impression Statement Patient requiring tactile and verbal cuing for pelvic stability when performing bird dog exercise. Tactile cues provided for scapular retraction when performing partial dead lift. Would benefit from continued skilled intervention to address impairments for improved activity tolerance.    Personal Factors and Comorbidities Comorbidity 2    Comorbidities arthritis, hypertension    Examination-Activity Limitations Reach Overhead;Carry;Lift    Examination-Participation Restrictions Cleaning;Yard Work    Rehab  Potential Good    PT Frequency 1x / week    PT Duration 6 weeks    PT Treatment/Interventions ADLs/Self Care Home Management;Cryotherapy;Electrical Stimulation;Iontophoresis 4mg /ml Dexamethasone;Moist Heat;Traction;Functional mobility training;Therapeutic activities;Therapeutic exercise;Neuromuscular re-education;Patient/family education;Manual techniques;Passive range of motion;Dry needling;Taping;Spinal Manipulations;Joint Manipulations    PT Next Visit Plan continue to progress functional lifts and carries; core strength    PT Home Exercise Plan Access Code: [email or web address]    Consulted and Agree with Plan of Care Patient           Patient will benefit from skilled therapeutic intervention in order to improve the following deficits and impairments:  Decreased activity tolerance,Decreased range of motion,Hypomobility,Increased muscle spasms,Postural dysfunction,Pain  Visit Diagnosis: Cramp and spasm  Cervicalgia     Problem List Patient Active Problem List   Diagnosis Date Noted  . Osteoarthritis of right knee - sees Dr. Wynelle Link 02/19/2014  . HTN (hypertension) 01/01/2013  . Allergic rhinitis 01/01/2013   [name], DPT  12/23/20 4:14 PM    [facility] [address], [place], [place], [address] Phone: [phone]   Fax:  [phone]  Name: Melinda Ferguson MRN: [id number] Date of Birth: 11/14/1950

## 2020-12-31 ENCOUNTER — Ambulatory Visit: Payer: Medicare PPO | Admitting: Physical Therapy

## 2020-12-31 ENCOUNTER — Other Ambulatory Visit: Payer: Self-pay

## 2020-12-31 DIAGNOSIS — R252 Cramp and spasm: Secondary | ICD-10-CM

## 2020-12-31 DIAGNOSIS — M542 Cervicalgia: Secondary | ICD-10-CM

## 2020-12-31 NOTE — Therapy (Signed)
[facility] [address], [place], [place], [address] Phone: [phone]   Fax:  [phone]  Physical Therapy Treatment  Patient Details  Name: Melinda Ferguson MRN: [id number] Date of Birth: May 24, 1951 Referring Provider (PT): Erskine Emery, PA-C   Encounter Date: 12/31/2020   PT End of Session - 12/31/20 1357     Visit Number 12    Date for PT Re-Evaluation 01/21/21    Authorization Type [employer]    Authorization Time Period [employer] approved 6 visits, 12/10/2020-01/21/2021    Authorization - Visit Number 4    Authorization - Number of Visits 6    Progress Note Due on Visit 18    PT Start Time 1019    PT Stop Time 1100    PT Time Calculation (min) 41 min    Activity Tolerance Patient tolerated treatment well;No increased pain    Behavior During Therapy WFL for tasks assessed/performed             Past Medical History:  Diagnosis Date   Allergy    Arthritis    Colon polyps    Hypertension     Past Surgical History:  Procedure Laterality Date   COLONOSCOPY     orthoscopy Left 1994   KNEE   TONSILLECTOMY     wisdom teeth extraction  1970    There were no vitals filed for this visit.   Subjective Assessment - 12/31/20 1350     Subjective Patient reports increased fasiculations and neck/shoulder pain since last session. Also has been active in her garden. Wants to know if dry needling can be done.    Pertinent History arthritis, hypertension    How long can you sit comfortably? unlimited    How long can you stand comfortably? unlimited    How long can you walk comfortably? unlimited    Diagnostic tests X-ray; At C4-C5 there is osseous foraminal narrowing greater on the left than the right.  Otherwise no spondylolisthesis.  No acute fractures.  Thoracic spine dated 09/21/2020 two-view views shows.  No acute fractures no bony abnormalities.  Mild endplate spurring lower thoracic upper  lumbar.    Currently in Pain? Yes    Pain Score 5     Pain Location Back    Pain Orientation Right;Left;Mid    Pain Descriptors / Indicators Aching;Discomfort                               [facility] Adult PT Treatment/Exercise - 12/31/20 0001       Neck Exercises: Machines for Strengthening   UBE (Upper Arm Bike) L1.5; 2/2 fwd/back      Shoulder Exercises: Standing   Protraction Weight (lbs) forearm serratus push up at wall; x12 repetitions    Protraction Limitations forearm slides for serratus; blue loop at wrists; 12 reps    Row Both;12 reps;Theraband    Theraband Level (Shoulder Row) Level 4 (Blue)    Other Standing Exercises band lat pull down; blue tband; x12 repetitions      Manual Therapy   Soft tissue mobilization palpation and skilled assessment of tissues before, during, and after DN              Trigger Point Dry Needling - 12/31/20 0001     Consent Given? Yes    Education Handout Provided Previously provided    Muscles Treated Head and Neck Upper trapezius;Levator scapulae;Cervical multifidi  Muscles Treated Upper Quadrant Rhomboids    Muscles Treated Back/Hip Thoracic multifidi    Dry Needling Comments bil    Upper Trapezius Response Twitch reponse elicited    Levator Scapulae Response Twitch response elicited    Cervical multifidi Response Palpable increased muscle length    Rhomboids Response Palpable increased muscle length    Thoracic multifidi response Palpable increased muscle length                  PT Education - 12/31/20 1354     Education Details band lat pull down, standing row; serratus wall slides    Person(s) Educated Patient    Methods Explanation;Demonstration;Tactile cues;Verbal cues;Handout    Comprehension Verbalized understanding;Returned demonstration;Verbal cues required;Tactile cues required              PT Short Term Goals - 12/03/20 1625       PT SHORT TERM GOAL #1   Title Patient will be  independent with HEP for continued progression at home.    Time 4    Period Weeks    Status Achieved    Target Date 11/12/20      PT SHORT TERM GOAL #2   Title Patient will carry 5# using Lt UE and ambulate 10 feet without increased pain for improved ADLs    Time 4    Period Weeks    Status Achieved    Target Date 12/10/20               PT Long Term Goals - 12/31/20 1356       PT LONG TERM GOAL #1   Title Patient will be independent with advanced HEP for long term management of symptoms post D/C.    Time 8    Period Weeks    Status Achieved      PT LONG TERM GOAL #2   Title Patient will lift 10# from floor using bil UE and carry 20 feet without increased pain for improved ADLs    Baseline goal addended 11/26/2020    Time 8    Period Weeks    Status On-going      PT LONG TERM GOAL #3   Title Patient will score 69 or higher on FOTO to indicate improved overall function.    Baseline 62    Time 8    Period Weeks    Status On-going      PT LONG TERM GOAL #4   Title Patient will demonstrate 40 degrees cervical side bending bilaterally as an indicator decreased muscle guarding and resolution of symptoms    Baseline Rt 28 degress; Lt 30 degrees    Time 8    Period Weeks    Status On-going                   Plan - 12/31/20 1355     Clinical Impression Statement Patient reporting increased fatigue following row and lat pull down exercise. Verbal and tactile cues provided for scapular depression and retraction. Twitch response elicited with DN performed to bilateral upper traps and levator scap. Would benefit from continued skilled intervention to address impairments for decreased pain and improved activity tolerance.    Personal Factors and Comorbidities Comorbidity 2    Comorbidities arthritis, hypertension    Examination-Activity Limitations Reach Overhead;Carry;Lift    Examination-Participation Restrictions Cleaning;Yard Work    Rehab Potential Good    PT  Frequency 1x / week    PT Duration 6 weeks  PT Treatment/Interventions ADLs/Self Care Home Management;Cryotherapy;Electrical Stimulation;Iontophoresis 4mg /ml Dexamethasone;Moist Heat;Traction;Functional mobility training;Therapeutic activities;Therapeutic exercise;Neuromuscular re-education;Patient/family education;Manual techniques;Passive range of motion;Dry needling;Taping;Spinal Manipulations;Joint Manipulations    PT Next Visit Plan assess response to DN; continue to progress functional lifts and carries; core strength    PT Home Exercise Plan Access Code: [email or web address]    Consulted and Agree with Plan of Care Patient             Patient will benefit from skilled therapeutic intervention in order to improve the following deficits and impairments:  Decreased activity tolerance, Decreased range of motion, Hypomobility, Increased muscle spasms, Postural dysfunction, Pain  Visit Diagnosis: Cramp and spasm  Cervicalgia     Problem List Patient Active Problem List   Diagnosis Date Noted   Osteoarthritis of right knee - sees Dr. Wynelle Link 02/19/2014   HTN (hypertension) 01/01/2013   Allergic rhinitis 01/01/2013    [name], DPT  12/31/20 1:59 PM     [facility] [address], [place], [place], [address] Phone: [phone]   Fax:  [phone]  Name: JOYCEANN KRUSER MRN: [id number] Date of Birth: Aug 25, 1950

## 2020-12-31 NOTE — Patient Instructions (Signed)
Standing Lat Pull Down with Resistance - Elbows Bent - 1 x daily - 7 x weekly - 2 sets - 12 reps Standing Bilateral Low Shoulder Row with Anchored Resistance - 1 x daily - 7 x weekly - 2 sets - 12 reps Serratus Activation at Wall - 1 x daily - 7 x weekly - 2 sets - 12 reps

## 2021-01-06 ENCOUNTER — Ambulatory Visit: Payer: Medicare PPO | Admitting: Family Medicine

## 2021-01-06 DIAGNOSIS — H25813 Combined forms of age-related cataract, bilateral: Secondary | ICD-10-CM | POA: Diagnosis not present

## 2021-01-06 DIAGNOSIS — H524 Presbyopia: Secondary | ICD-10-CM | POA: Diagnosis not present

## 2021-01-06 DIAGNOSIS — H5213 Myopia, bilateral: Secondary | ICD-10-CM | POA: Diagnosis not present

## 2021-01-06 DIAGNOSIS — H52203 Unspecified astigmatism, bilateral: Secondary | ICD-10-CM | POA: Diagnosis not present

## 2021-01-07 ENCOUNTER — Ambulatory Visit: Payer: Medicare PPO | Admitting: Physical Therapy

## 2021-01-07 ENCOUNTER — Other Ambulatory Visit: Payer: Self-pay

## 2021-01-07 DIAGNOSIS — M542 Cervicalgia: Secondary | ICD-10-CM

## 2021-01-07 DIAGNOSIS — R252 Cramp and spasm: Secondary | ICD-10-CM

## 2021-01-07 NOTE — Therapy (Signed)
[facility] [address] [place], [place], [address] Phone: [phone]   Fax:  [phone]  Physical Therapy Treatment  Patient Details  Name: Melinda Ferguson MRN: [id number] Date of Birth: 1951-02-18 Referring Provider (PT): Erskine Emery, PA-C   Encounter Date: 01/07/2021   PT End of Session - 01/07/21 1424     Visit Number 13    Date for PT Re-Evaluation 01/21/21    Authorization Type [employer]    Authorization Time Period [employer] approved 6 visits, 12/10/2020-01/21/2021    Authorization - Visit Number 5    Authorization - Number of Visits 6    Progress Note Due on Visit 18    PT Start Time 1017    PT Stop Time 1100    PT Time Calculation (min) 43 min    Activity Tolerance Patient tolerated treatment well;No increased pain    Behavior During Therapy WFL for tasks assessed/performed             Past Medical History:  Diagnosis Date   Allergy    Arthritis    Colon polyps    Hypertension     Past Surgical History:  Procedure Laterality Date   COLONOSCOPY     orthoscopy Left 1994   KNEE   TONSILLECTOMY     wisdom teeth extraction  1970    There were no vitals filed for this visit.   Subjective Assessment - 01/07/21 1022     Subjective Has continued to have spasms but decreased intensity from last session. Has made some activity modifications at home. Has had no increased symptoms following HEP at home.    Pertinent History arthritis, hypertension    How long can you sit comfortably? unlimited    How long can you stand comfortably? unlimited    How long can you walk comfortably? unlimited    Diagnostic tests X-ray; At C4-C5 there is osseous foraminal narrowing greater on the left than the right.  Otherwise no spondylolisthesis.  No acute fractures.  Thoracic spine dated 09/21/2020 two-view views shows.  No acute fractures no bony abnormalities.  Mild endplate spurring lower thoracic  upper lumbar.    Patient Stated Goals to not be reliant on medication    Currently in Pain? No/denies                               [facility] Adult PT Treatment/Exercise - 01/07/21 0001       Neck Exercises: Machines for Strengthening   Nustep L2 x 8 minutes; PT present to discuss progress and assess response to activity    Power Tower resisted walking at power tower; 20#; x4 each directions    Other Machines for Strengthening anti rotation press green tband x15 Lt/Rt; with stir the pot x15 Lt/Rt      Shoulder Exercises: Seated   Horizontal ABduction Both;20 reps    Theraband Level (Shoulder Horizontal ABduction) Level 1 (Yellow)      Manual Therapy   Soft tissue mobilization palpation and skilled assessment of tissues before, during, and after DN              Trigger Point Dry Needling - 01/07/21 0001     Consent Given? Yes    Education Handout Provided Previously provided    Muscles Treated Head and Neck Upper trapezius;Levator scapulae    Muscles Treated Upper Quadrant Middle trapezius;Lower trapezius    Muscles Treated  Back/Hip Thoracic multifidi    Dry Needling Comments Lt/Rt    Upper Trapezius Response Palpable increased muscle length    Lower trapezius Response Palpable increased muscle length    Middle trapezius Response Palpable increased muscle length    Thoracic multifidi response Palpable increased muscle length                    PT Short Term Goals - 12/03/20 1625       PT SHORT TERM GOAL #1   Title Patient will be independent with HEP for continued progression at home.    Time 4    Period Weeks    Status Achieved    Target Date 11/12/20      PT SHORT TERM GOAL #2   Title Patient will carry 5# using Lt UE and ambulate 10 feet without increased pain for improved ADLs    Time 4    Period Weeks    Status Achieved    Target Date 12/10/20               PT Long Term Goals - 01/07/21 1423       PT LONG TERM GOAL #1    Title Patient will be independent with advanced HEP for long term management of symptoms post D/C.    Time 8    Period Weeks    Status Achieved      PT LONG TERM GOAL #2   Title Patient will lift 10# from floor using bil UE and carry 20 feet without increased pain for improved ADLs    Baseline goal addended 11/26/2020    Time 8    Period Weeks    Status On-going                   Plan - 01/07/21 1410     Clinical Impression Statement Patient reports no increased pain throughout session. Reports decreased neck pain following DN last session. Noting increased scapular elevation with fatigue during horizontal abduction.    Personal Factors and Comorbidities Comorbidity 2    Comorbidities arthritis, hypertension    Examination-Activity Limitations Reach Overhead;Carry;Lift    Examination-Participation Restrictions Cleaning;Yard Work    Rehab Potential Good    PT Frequency 1x / week    PT Duration 6 weeks    PT Treatment/Interventions ADLs/Self Care Home Management;Cryotherapy;Electrical Stimulation;Iontophoresis 4mg /ml Dexamethasone;Moist Heat;Traction;Functional mobility training;Therapeutic activities;Therapeutic exercise;Neuromuscular re-education;Patient/family education;Manual techniques;Passive range of motion;Dry needling;Taping;Spinal Manipulations;Joint Manipulations    PT Next Visit Plan begin to finalize HEP in prep for therapy    PT Home Exercise Plan Access Code: [email or web address]    Consulted and Agree with Plan of Care Patient             Patient will benefit from skilled therapeutic intervention in order to improve the following deficits and impairments:  Decreased activity tolerance, Decreased range of motion, Hypomobility, Increased muscle spasms, Postural dysfunction, Pain  Visit Diagnosis: Cramp and spasm  Cervicalgia     Problem List Patient Active Problem List   Diagnosis Date Noted   Osteoarthritis of right knee - sees Dr. Wynelle Link 02/19/2014   HTN  (hypertension) 01/01/2013   Allergic rhinitis 01/01/2013    [name], DPT  01/07/21 2:25 PM   [name] [address] [place], [place], [address] Phone: [phone]   Fax:  [phone]  Name: CARLIS BURNSWORTH MRN: [id number] Date of Birth: [birth year]

## 2021-01-14 ENCOUNTER — Ambulatory Visit: Payer: Medicare PPO | Admitting: Physical Therapy

## 2021-01-14 ENCOUNTER — Other Ambulatory Visit: Payer: Self-pay | Admitting: Family Medicine

## 2021-01-14 ENCOUNTER — Other Ambulatory Visit: Payer: Self-pay

## 2021-01-14 DIAGNOSIS — R252 Cramp and spasm: Secondary | ICD-10-CM

## 2021-01-14 DIAGNOSIS — M542 Cervicalgia: Secondary | ICD-10-CM

## 2021-01-14 NOTE — Therapy (Signed)
[facility] [address], [place], [place], [address] Phone: [phone]   Fax:  [phone]  Physical Therapy Treatment  Patient Details  Name: Melinda Ferguson MRN: [id number] Date of Birth: May 29, 1951 Referring Provider (PT): Erskine Emery, PA-C   Encounter Date: 01/14/2021   PT End of Session - 01/14/21 1723     Visit Number 14    Date for PT Re-Evaluation 01/21/21    Authorization Type [employer]    Authorization Time Period [employer] approved 6 visits, 12/10/2020-01/21/2021    Authorization - Visit Number 6    Authorization - Number of Visits 6    Progress Note Due on Visit 18    PT Start Time 1020   patient arriving late   PT Stop Time 1100    PT Time Calculation (min) 40 min    Activity Tolerance Patient tolerated treatment well;No increased pain    Behavior During Therapy WFL for tasks assessed/performed             Past Medical History:  Diagnosis Date   Allergy    Arthritis    Colon polyps    Hypertension     Past Surgical History:  Procedure Laterality Date   COLONOSCOPY     orthoscopy Left 1994   KNEE   TONSILLECTOMY     wisdom teeth extraction  1970    There were no vitals filed for this visit.   Subjective Assessment - 01/14/21 1715     Subjective Has had increased symptoms and has had to resume taking medication regularly. Would like to seek further treatment options.    Pertinent History arthritis, hypertension    How long can you sit comfortably? unlimited    How long can you stand comfortably? unlimited    How long can you walk comfortably? unlimited    Diagnostic tests X-ray; At C4-C5 there is osseous foraminal narrowing greater on the left than the right.  Otherwise no spondylolisthesis.  No acute fractures.  Thoracic spine dated 09/21/2020 two-view views shows.  No acute fractures no bony abnormalities.  Mild endplate spurring lower thoracic upper lumbar.    Patient  Stated Goals to not be reliant on medication    Currently in Pain? No/denies    Pain Onset More than a month ago                [facility] PT Assessment - 01/14/21 0001       Assessment   Medical Diagnosis M54.2 (ICD-10-CM) - Cervicalgia    Referring Provider (PT) Erskine Emery, PA-C    Onset Date/Surgical Date 08/19/20    Hand Dominance Left    Prior Therapy None      Precautions   Precautions None      Restrictions   Weight Bearing Restrictions No      Balance Screen   Has the patient fallen in the past 6 months No    Has the patient had a decrease in activity level because of a fear of falling?  No    Is the patient reluctant to leave their home because of a fear of falling?  No      Home Ecologist residence    Living Arrangements Spouse/significant other      Prior Function   Level of Independence Independent    Vocation Retired      Observation/Other Assessments   Focus on Therapeutic Outcomes ([place])  61 (goal 26)  Posture/Postural Control   Posture/Postural Control Postural limitations    Postural Limitations Forward head      AROM   Overall AROM Comments cervical flexion 61, cervical extension 52. Rt rotation 71, Lt rotation 80    Cervical - Right Side Bend 42    Cervical - Left Side Bend 37      Spurling's   Findings Negative    Comment Rt and Lt                           [facility] Adult PT Treatment/Exercise - 01/14/21 0001       Neck Exercises: Standing   Neck Retraction 10 reps    Neck Retraction Limitations with ball roll at wall      Neck Exercises: Stretches   Upper Trapezius Stretch Right;Left;1 rep;20 seconds    Levator Stretch Right;Left;1 rep;20 seconds    Other Neck Stretches cervical rotation AAROM with towel, 5 x 5s hold to Rt                    PT Education - 01/14/21 1716     Education Details assisted cervical rotation with towel, supine cervical rotation on ball,  seated upper trap stretch, levatro scapulae stretch    Person(s) Educated Patient    Methods Explanation;Demonstration;Verbal cues;Tactile cues;Handout    Comprehension Verbalized understanding;Returned demonstration;Verbal cues required;Tactile cues required              PT Short Term Goals - 01/14/21 1721       PT SHORT TERM GOAL #1   Title Patient will be independent with HEP for continued progression at home.    Time 4    Period Weeks    Status Achieved    Target Date 11/12/20      PT SHORT TERM GOAL #2   Title Patient will carry 5# using Lt UE and ambulate 10 feet without increased pain for improved ADLs    Time 4    Period Weeks    Status Achieved    Target Date 12/10/20               PT Long Term Goals - 01/14/21 1721       PT LONG TERM GOAL #1   Title Patient will be independent with advanced HEP for long term management of symptoms post D/C.    Time 8    Period Weeks    Status Achieved      PT LONG TERM GOAL #2   Title Patient will lift 10# from floor using bil UE and carry 20 feet without increased pain for improved ADLs    Baseline goal addended 11/26/2020    Time 8    Period Weeks    Status Partially Met   Able to lift 10# from ground level     PT LONG TERM GOAL #3   Title Patient will score 69 or higher on FOTO to indicate improved overall function.    Baseline 62    Time 8    Period Weeks    Status Not Met   61     PT LONG TERM GOAL #4   Title Patient will demonstrate 40 degrees cervical side bending bilaterally as an indicator decreased muscle guarding and resolution of symptoms    Baseline Rt 28 degress; Lt 30 degrees    Time 8    Period Weeks    Status Partially Met   Rt  42, Lt 37                  Plan - 01/14/21 1717     Clinical Impression Statement Patient is a 70 y/o female referred due to neck pain. She demonstrates improved cervical AROM. Functional activity tolerance improved as patient is able to raise >5 lbs from  ground level with good form and without increased pain. However, symptoms have recently continue to fluctuate as patient no longer has pain but continues to have fasiculations that radiate from Rt cervical through rib cage. Additionally, patient has not met FOTO goal indicating continued overall functional deficits. Would benefit from further MD evaluation. Recommend D/C to HEP and further MD guidance.    Personal Factors and Comorbidities Comorbidity 2    Comorbidities arthritis, hypertension    Examination-Activity Limitations Reach Overhead;Carry;Lift    Examination-Participation Restrictions Cleaning;Yard Work    Rehab Potential Good    PT Frequency 1x / week    PT Duration 6 weeks    PT Treatment/Interventions ADLs/Self Care Home Management;Cryotherapy;Electrical Stimulation;Iontophoresis 69m/ml Dexamethasone;Moist Heat;Traction;Functional mobility training;Therapeutic activities;Therapeutic exercise;Neuromuscular re-education;Patient/family education;Manual techniques;Passive range of motion;Dry needling;Taping;Spinal Manipulations;Joint Manipulations    PT Next Visit Plan D/C to HEP    PT Home Exercise Plan Access Code: [email or web address]   Consulted and Agree with Plan of Care Patient             Patient will benefit from skilled therapeutic intervention in order to improve the following deficits and impairments:  Decreased activity tolerance, Decreased range of motion, Hypomobility, Increased muscle spasms, Postural dysfunction, Pain  Visit Diagnosis: Cramp and spasm  Cervicalgia     Problem List Patient Active Problem List   Diagnosis Date Noted   Osteoarthritis of right knee - sees Dr. AWynelle Link08/10/2013   HTN (hypertension) 01/01/2013   Allergic rhinitis 01/01/2013   PHYSICAL THERAPY DISCHARGE SUMMARY  Visits from Start of Care: 14  Current functional level related to goals / functional outcomes: See above   Remaining deficits: See above   Education / Equipment: See  above   Patient agrees to discharge. Patient goals were partially met. Patient is being discharged due to lack of progress.   KEverardo AllPT, DPT  01/14/21 5:25 PM   [facility] [address] [place] [address]Phone: [phone]  Fax:  [phone] Name: Melinda ANDRINGAMRN: [id number] of Birth: 109-28-1952

## 2021-01-14 NOTE — Patient Instructions (Signed)
Seated Assisted Cervical Rotation with Towel - 2 x daily - 7 x weekly - 1 sets - 2 reps - 20s hold Supine Cervical Rotation AROM on Flat Ball - 1 x daily - 7 x weekly - 1 sets - 10 reps Seated Cervical Sidebending Stretch - 2 x daily - 7 x weekly - 1 sets - 2 reps - 20s hold Gentle Levator Scapulae Stretch - 2 x daily - 7 x weekly - 1 sets - 2 reps - 20s hold

## 2021-01-20 ENCOUNTER — Other Ambulatory Visit: Payer: Self-pay | Admitting: Family Medicine

## 2021-01-21 ENCOUNTER — Encounter: Payer: Medicare PPO | Admitting: Physical Therapy

## 2021-01-21 ENCOUNTER — Ambulatory Visit: Payer: Medicare PPO | Admitting: Orthopaedic Surgery

## 2021-01-21 ENCOUNTER — Encounter: Payer: Self-pay | Admitting: Orthopaedic Surgery

## 2021-01-21 DIAGNOSIS — M542 Cervicalgia: Secondary | ICD-10-CM | POA: Diagnosis not present

## 2021-01-21 MED ORDER — CYCLOBENZAPRINE HCL 10 MG PO TABS: 10.0000 mg | ORAL_TABLET | Freq: Three times a day (TID) | ORAL | 1 refills | Status: DC | PRN

## 2021-01-21 NOTE — Progress Notes (Signed)
The patient is continuing to have bad left-sided neck pain and spasms.  She has been through multiple physical therapy sessions and has plateaued and is actually going backwards at this point due to the pain she is having in her neck that does radiate into the left shoulder and trapezius and parascapular area as well as the clavicle area to the left side.  She denies any weakness in her hands.  She has had multiple physical therapy sessions.  She has been on Robaxin and Celebrex and none of this is helping.  She is now having well over 6 weeks of physical therapy and multiple sessions.  On exam she has a lot of left-sided neck pain and pain with rotation and lateral bending to the left side.  There is a positive Spurling sign to the left side.  She has good strength in her bilateral upper extremities and normal sensation.  I would have her try Flexeril now instead of Robaxin.  We will order a MRI of the cervical spine to rule out any type of nerve compression given her continued pain and failure of conservative treatment including a long trial of physical therapy.  We will see her back after the MRI.

## 2021-01-22 ENCOUNTER — Other Ambulatory Visit: Payer: Self-pay

## 2021-01-22 DIAGNOSIS — M542 Cervicalgia: Secondary | ICD-10-CM

## 2021-01-23 ENCOUNTER — Encounter: Payer: Self-pay | Admitting: Orthopaedic Surgery

## 2021-01-27 ENCOUNTER — Ambulatory Visit
Admission: RE | Admit: 2021-01-27 | Discharge: 2021-01-27 | Disposition: A | Discharge status: Home / Self Care | Payer: Medicare PPO | Source: Ambulatory Visit | Attending: Orthopaedic Surgery | Admitting: Orthopaedic Surgery

## 2021-01-27 DIAGNOSIS — M50222 Other cervical disc displacement at C5-C6 level: Secondary | ICD-10-CM | POA: Diagnosis not present

## 2021-01-27 DIAGNOSIS — M5021 Other cervical disc displacement,  high cervical region: Secondary | ICD-10-CM | POA: Diagnosis not present

## 2021-01-27 DIAGNOSIS — M4313 Spondylolisthesis, cervicothoracic region: Secondary | ICD-10-CM | POA: Diagnosis not present

## 2021-01-27 DIAGNOSIS — M542 Cervicalgia: Secondary | ICD-10-CM

## 2021-01-27 DIAGNOSIS — M50221 Other cervical disc displacement at C4-C5 level: Secondary | ICD-10-CM | POA: Diagnosis not present

## 2021-01-28 ENCOUNTER — Telehealth: Payer: Self-pay | Admitting: Orthopaedic Surgery

## 2021-01-28 NOTE — Telephone Encounter (Signed)
Can you look at the MRI and see if we can refer pt please

## 2021-01-28 NOTE — Telephone Encounter (Signed)
I called pt to let her know she probably won't be able to be seen until next week but we could possibly do something about her pain in the meantime.   CB [phone]

## 2021-01-28 NOTE — Telephone Encounter (Signed)
Please advise 

## 2021-01-28 NOTE — Telephone Encounter (Signed)
Message sent to gil to review MRI

## 2021-01-28 NOTE — Telephone Encounter (Signed)
PT called wondering if Dr.Blackman can see her tomorrow because she is having a very uncomfortable pain in her neck?   CB [phone]

## 2021-01-28 NOTE — Telephone Encounter (Signed)
Pt is aware.  

## 2021-01-29 ENCOUNTER — Other Ambulatory Visit: Payer: Self-pay | Admitting: Physician Assistant

## 2021-01-29 MED ORDER — TRAMADOL HCL 50 MG PO TABS: 50.0000 mg | ORAL_TABLET | Freq: Four times a day (QID) | ORAL | 0 refills | Status: DC | PRN

## 2021-01-29 NOTE — Telephone Encounter (Signed)
Lvm informing pt.

## 2021-02-04 ENCOUNTER — Encounter: Payer: Self-pay | Admitting: Orthopaedic Surgery

## 2021-02-04 ENCOUNTER — Ambulatory Visit: Payer: Medicare PPO | Admitting: Orthopaedic Surgery

## 2021-02-04 DIAGNOSIS — M542 Cervicalgia: Secondary | ICD-10-CM

## 2021-02-04 NOTE — Progress Notes (Signed)
H PI: Melinda Ferguson returns today to go over the MRI of her cervical spine.  She states that the shoulder pain that she has had in the left shoulder is really gone.  She feels some spasm in her neck mostly in the left side of her neck.  She states it feels like a muscle but has been exhausted in her upper chest and to some extent bilateral shoulders.  No radicular symptoms down either arm.  She does feel that the spasm that she has had is gotten better with Flexeril.  She did take some tramadol which helped initially.  MRI cervical spine is reviewed with the patient.  Images are reviewed.  There are some generalized cervical spine degeneration but no neural compression.  Mild anterior listhesis at C7-T1.   Physical exam: General well-developed well-nourished female no acute distress mood affect appropriate. Psych: Alert and oriented x3 Upper extremities: 5 out of 5 strength of external and internal rotation against resistance.  Empty can test negative bilaterally.  Good range of motion bilateral shoulders without pain.  Cervical spine full range of motion with flexion extension and rotation.  Positive Spurling's.  Nontender cervical spine with palpation.  Nontender over the medial borders of the bilateral scapula.   Impression: Cervicalgia  Plan given the patient's continued neck pain recommend cervical epidural steroid injection by Dr. Ernestina Patches this is both for hopefully therapeutic and diagnostic purposes.  Continue PT to work on her neck.  She follow-up with [place] as needed pain persist or becomes worse.  Questions encouraged and answered by Dr. Ninfa Linden and myself.

## 2021-02-04 NOTE — Addendum Note (Signed)
Addended by: Robyne Peers on: 02/04/2021 01:21 PM   Modules accepted: Orders

## 2021-02-10 ENCOUNTER — Telehealth: Payer: Self-pay | Admitting: Physical Medicine and Rehabilitation

## 2021-02-10 NOTE — Telephone Encounter (Signed)
Pt returned call to [facility]. Pt phone number is [phone].

## 2021-02-11 ENCOUNTER — Telehealth: Payer: Self-pay

## 2021-02-11 ENCOUNTER — Telehealth: Payer: Self-pay | Admitting: Physical Medicine and Rehabilitation

## 2021-02-11 NOTE — Telephone Encounter (Signed)
Pt returned call to [facility] about an appt. Please call pt at [phone]

## 2021-02-11 NOTE — Telephone Encounter (Signed)
Patient called she is returning your phone call, patient is requesting a call back:[phone]

## 2021-02-13 ENCOUNTER — Telehealth: Payer: Self-pay | Admitting: Physical Medicine and Rehabilitation

## 2021-02-13 NOTE — Telephone Encounter (Signed)
Appointment rescheduled.

## 2021-02-13 NOTE — Telephone Encounter (Signed)
Patient called needing to reschedule her appointment due to not having a driver. The number to contact patient is [phone]

## 2021-02-14 ENCOUNTER — Encounter: Payer: Self-pay | Admitting: Gastroenterology

## 2021-02-16 ENCOUNTER — Ambulatory Visit: Payer: Medicare PPO | Admitting: Physical Medicine and Rehabilitation

## 2021-02-23 ENCOUNTER — Encounter: Payer: Self-pay | Admitting: Physical Medicine and Rehabilitation

## 2021-02-23 ENCOUNTER — Ambulatory Visit: Payer: Medicare PPO | Admitting: Physical Medicine and Rehabilitation

## 2021-02-23 ENCOUNTER — Ambulatory Visit: Payer: Self-pay

## 2021-02-23 ENCOUNTER — Other Ambulatory Visit: Payer: Self-pay

## 2021-02-23 VITALS — BP 137/85 | HR 73

## 2021-02-23 DIAGNOSIS — M5412 Radiculopathy, cervical region: Secondary | ICD-10-CM

## 2021-02-23 MED ORDER — BETAMETHASONE SOD PHOS & ACET 6 (3-3) MG/ML IJ SUSP
12.0000 mg | Freq: Once | INTRAMUSCULAR | Status: AC
  Administered 2021-02-23: 12 mg

## 2021-02-23 NOTE — Patient Instructions (Signed)

## 2021-02-23 NOTE — Progress Notes (Signed)
Pt state neck pain that travels to both shoulder, mostly her left. Pt state lifting and pulling makes the pain worse. Pt state uses pain meds and uses ice to help ease her pain.  Numeric Pain Rating Scale and Functional Assessment Average Pain 1   In the last MONTH (on 0-10 scale) has pain interfered with the following?  1. General activity like being  able to carry out your everyday physical activities such as walking, climbing stairs, carrying groceries, or moving a chair?  Rating(8)   +Driver, -BT, -Dye Allergies.

## 2021-02-24 NOTE — Progress Notes (Signed)
Melinda Ferguson - 70 y.o. female MRN [id number]  Date of birth: 09-27-1950  Office Visit Note: Visit Date: 02/23/2021 PCP: [name], MD Referred by: [name], MD  Subjective: Chief Complaint  Patient presents with   Neck - Pain   Right Shoulder - Pain   Left Shoulder - Pain   HPI:  Melinda Ferguson is a 70 y.o. female who comes in today at the request of Dr. Jean Rosenthal for planned Left C7-T1 Cervical Interlaminar epidural steroid injection with fluoroscopic guidance.  The patient has failed conservative care including home exercise, medications, time and activity modification.  This injection will be diagnostic and hopefully therapeutic.  Please see requesting physician notes for further details and justification. MRI reviewed with images and spine model.  MRI reviewed in the note below.     ROS Otherwise per HPI.  Assessment & Plan: Visit Diagnoses:    ICD-10-CM   1. Cervical radiculopathy  M54.12 XR C-ARM NO REPORT    Epidural Steroid injection    betamethasone acetate-betamethasone sodium phosphate (CELESTONE) injection 12 mg      Plan: No additional findings.   Meds & Orders:  Meds ordered this encounter  Medications   betamethasone acetate-betamethasone sodium phosphate (CELESTONE) injection 12 mg    Orders Placed This Encounter  Procedures   XR C-ARM NO REPORT   Epidural Steroid injection    Follow-up: Return if symptoms worsen or fail to improve.   Procedures: No procedures performed  Cervical Epidural Steroid Injection - Interlaminar Approach with Fluoroscopic Guidance  Patient: Melinda Ferguson      Date of Birth: Mar 03, 1951 MRN: [id number] PCP: [name], MD      Visit Date: 02/23/2021   Universal Protocol:    Date/Time: 08/09/225:42 AM  Consent Given By: the patient  Position: PRONE  Additional Comments: Vital signs were monitored before and after the procedure. Patient was prepped and draped in  the usual sterile fashion. The correct patient, procedure, and site was verified.   Injection Procedure Details:   Procedure diagnoses: Cervical radiculopathy [M54.12]    Meds Administered:  Meds ordered this encounter  Medications   betamethasone acetate-betamethasone sodium phosphate (CELESTONE) injection 12 mg     Laterality: Left  Location/Site: C7-T1  Needle: 3.5 in., 20 ga. Tuohy  Needle Placement: Paramedian epidural space  Findings:  -Comments: Excellent flow of contrast into the epidural space.  Procedure Details: Using a paramedian approach from the side mentioned above, the region overlying the inferior lamina was localized under fluoroscopic visualization and the soft tissues overlying this structure were infiltrated with 4 ml. of 1% Lidocaine without Epinephrine. A # 20 gauge, Tuohy needle was inserted into the epidural space using a paramedian approach.  The epidural space was localized using loss of resistance along with contralateral oblique bi-planar fluoroscopic views.  After negative aspirate for air, blood, and CSF, a 2 ml. volume of Isovue-250 was injected into the epidural space and the flow of contrast was observed. Radiographs were obtained for documentation purposes.   The injectate was administered into the level noted above.  Additional Comments:  The patient tolerated the procedure well Dressing: 2 x 2 sterile gauze and Band-Aid    Post-procedure details: Patient was observed during the procedure. Post-procedure instructions were reviewed.  Patient left the clinic in stable condition.   Clinical History: MRI CERVICAL SPINE WITHOUT CONTRAST   TECHNIQUE: Multiplanar, multisequence MR imaging of the cervical spine was performed. No intravenous contrast was  administered.   COMPARISON:  None.   FINDINGS: Alignment: Slight anterolisthesis at C7-T1, facet mediated/degenerative   Vertebrae: No fracture, evidence of discitis, or bone  lesion.   Cord: Normal signal and morphology.   Posterior Fossa, vertebral arteries, paraspinal tissues: Negative.   Disc levels:   C2-3: Degenerative facet spurring asymmetric to the left. Disc narrowing and bulging. Mild left foraminal narrowing   C3-4: Disc narrowing and bulging with uncovertebral spurring. Mild right foraminal narrowing   C4-5: Disc narrowing and bulging with circumferential ridge. No neural compression   C5-6: Disc narrowing and bulging with circumferential ridging. Mild left facet spurring. No neural compression   C6-7: Disc narrowing and bulging with small right foraminal protrusion suggested on sagittal images. Negative facets. No neural compression   C7-T1:Facet spurring mild anterolisthesis. Mild disc space narrowing.   IMPRESSION: Generalized cervical spine degeneration with mild C7-T1 anterolisthesis. No high-grade stenosis or herniation to explain left shoulder symptoms.     Electronically Signed   By: Monte Fantasia M.D.   On: 01/28/2021 07:33     Objective:  VS:  HT:    WT:   BMI:     BP:137/85  HR:73bpm  TEMP: ( )  RESP:  Physical Exam Vitals and nursing note reviewed.  Constitutional:      General: She is not in acute distress.    Appearance: Normal appearance. She is not ill-appearing.  HENT:     Head: Normocephalic and atraumatic.     Right Ear: External ear normal.     Left Ear: External ear normal.  Eyes:     Extraocular Movements: Extraocular movements intact.  Cardiovascular:     Rate and Rhythm: Normal rate.     Pulses: Normal pulses.  Musculoskeletal:     Cervical back: Tenderness present. No rigidity.     Right lower leg: No edema.     Left lower leg: No edema.     Comments: Patient has good strength in the upper extremities including 5 out of 5 strength in wrist extension long finger flexion and APB.  There is no atrophy of the hands intrinsically.  There is a negative Hoffmann's test.   Lymphadenopathy:      Cervical: No cervical adenopathy.  Skin:    Findings: No erythema, lesion or rash.  Neurological:     General: No focal deficit present.     Mental Status: She is alert and oriented to person, place, and time.     Sensory: No sensory deficit.     Motor: No weakness or abnormal muscle tone.     Coordination: Coordination normal.  Psychiatric:        Mood and Affect: Mood normal.        Behavior: Behavior normal.     Imaging: XR C-ARM NO REPORT  Result Date: 02/23/2021 Please see Notes tab for imaging impression.

## 2021-02-24 NOTE — Procedures (Signed)
Cervical Epidural Steroid Injection - Interlaminar Approach with Fluoroscopic Guidance  Patient: Melinda Ferguson      Date of Birth: 1951-06-22 MRN: [id number] PCP: [name], MD      Visit Date: 02/23/2021   Universal Protocol:    Date/Time: 08/09/225:42 AM  Consent Given By: the patient  Position: PRONE  Additional Comments: Vital signs were monitored before and after the procedure. Patient was prepped and draped in the usual sterile fashion. The correct patient, procedure, and site was verified.   Injection Procedure Details:   Procedure diagnoses: Cervical radiculopathy [M54.12]    Meds Administered:  Meds ordered this encounter  Medications   betamethasone acetate-betamethasone sodium phosphate (CELESTONE) injection 12 mg     Laterality: Left  Location/Site: C7-T1  Needle: 3.5 in., 20 ga. Tuohy  Needle Placement: Paramedian epidural space  Findings:  -Comments: Excellent flow of contrast into the epidural space.  Procedure Details: Using a paramedian approach from the side mentioned above, the region overlying the inferior lamina was localized under fluoroscopic visualization and the soft tissues overlying this structure were infiltrated with 4 ml. of 1% Lidocaine without Epinephrine. A # 20 gauge, Tuohy needle was inserted into the epidural space using a paramedian approach.  The epidural space was localized using loss of resistance along with contralateral oblique bi-planar fluoroscopic views.  After negative aspirate for air, blood, and CSF, a 2 ml. volume of Isovue-250 was injected into the epidural space and the flow of contrast was observed. Radiographs were obtained for documentation purposes.   The injectate was administered into the level noted above.  Additional Comments:  The patient tolerated the procedure well Dressing: 2 x 2 sterile gauze and Band-Aid    Post-procedure details: Patient was observed during the  procedure. Post-procedure instructions were reviewed.  Patient left the clinic in stable condition.

## 2021-03-15 ENCOUNTER — Other Ambulatory Visit: Payer: Self-pay | Admitting: Family Medicine

## 2021-04-24 DIAGNOSIS — Z23 Encounter for immunization: Secondary | ICD-10-CM | POA: Diagnosis not present

## 2021-04-28 ENCOUNTER — Encounter: Payer: Self-pay | Admitting: Family Medicine

## 2021-04-28 DIAGNOSIS — S0502XA Injury of conjunctiva and corneal abrasion without foreign body, left eye, initial encounter: Secondary | ICD-10-CM | POA: Diagnosis not present

## 2021-05-03 DIAGNOSIS — Z23 Encounter for immunization: Secondary | ICD-10-CM | POA: Diagnosis not present

## 2021-05-22 ENCOUNTER — Ambulatory Visit: Payer: Medicare PPO

## 2021-05-25 ENCOUNTER — Other Ambulatory Visit: Payer: Self-pay | Admitting: Family Medicine

## 2021-05-26 ENCOUNTER — Ambulatory Visit: Payer: Self-pay

## 2021-05-26 ENCOUNTER — Ambulatory Visit (INDEPENDENT_AMBULATORY_CARE_PROVIDER_SITE_OTHER): Payer: Medicare PPO

## 2021-05-26 VITALS — Ht 63.5 in | Wt 190.0 lb

## 2021-05-26 DIAGNOSIS — Z0000 Encounter for general adult medical examination without abnormal findings: Secondary | ICD-10-CM | POA: Diagnosis not present

## 2021-05-26 NOTE — Progress Notes (Signed)
I connected with Melinda Ferguson today by telephone and verified that I am speaking with the correct person using two identifiers. Location patient: home Location provider: work Persons participating in the virtual visit: Melinda Ferguson, Glenna Durand LPN.   I discussed the limitations, risks, security and privacy concerns of performing an evaluation and management service by telephone and the availability of in person appointments. I also discussed with the patient that there may be a patient responsible charge related to this service. The patient expressed understanding and verbally consented to this telephonic visit.    Interactive audio and video telecommunications were attempted between this provider and patient, however failed, due to patient having technical difficulties OR patient did not have access to video capability.  We continued and completed visit with audio only.     Vital signs may be patient reported or missing.  Subjective:   Melinda Ferguson is a 70 y.o. female who presents for Medicare Annual (Subsequent) preventive examination.  Review of Systems     Cardiac Risk Factors include: advanced age (>37men, >35 women);hypertension;obesity (BMI >30kg/m2)     Objective:    Today's Vitals   05/26/21 1456  Weight: 190 lb (86.2 kg)  Height: 5' 3.5" (1.613 m)  PainSc: 2    Body mass index is 33.13 kg/m.  Advanced Directives 05/26/2021 10/15/2020 05/20/2020 02/11/2016 01/26/2016  Does Patient Have a Medical Advance Directive? Yes Yes Yes Yes Yes  Type of Paramedic of [place];Living will - [name];Living will [place];Living will [place] in Chart? No - copy requested - No - copy requested - -    Current Medications (verified) Outpatient Encounter Medications as of 05/26/2021  Medication Sig   Azelastine HCl 0.15 % SOLN 2 actuations in each  nostril BID   fexofenadine (ALLEGRA) 60 MG tablet Take 60 mg by mouth daily.   gabapentin (NEURONTIN) 100 MG capsule TAKE 1-3 CAPSULES AT BEDTIME   losartan-hydrochlorothiazide (HYZAAR) 100-12.5 MG tablet TAKE 1 TABLET ONCE DAILY. **NEED OFFICE VISIT**   psyllium (METAMUCIL) 58.6 % powder Take 1 packet by mouth daily.   BIOTIN PO Take 1 tablet by mouth daily. (Patient not taking: Reported on 05/26/2021)   celecoxib (CELEBREX) 200 MG capsule Take 1 capsule (200 mg total) by mouth 2 (two) times daily as needed. (Patient not taking: Reported on 05/26/2021)   Cholecalciferol (VITAMIN D PO) Take 1,000 Units by mouth daily.  (Patient not taking: Reported on 05/26/2021)   cyclobenzaprine (FLEXERIL) 10 MG tablet Take 1 tablet (10 mg total) by mouth 3 (three) times daily as needed for muscle spasms. (Patient not taking: Reported on 05/26/2021)   diphenhydramine-acetaminophen (TYLENOL PM) 25-500 MG TABS tablet Take 1 tablet by mouth at bedtime. (Patient not taking: Reported on 05/26/2021)   Emollient (COLLAGEN EX) Apply topically. (Patient not taking: Reported on 05/26/2021)   LUTEIN PO Take 1 tablet by mouth daily.  (Patient not taking: Reported on 05/26/2021)   Misc Natural Products (RESVERATROL DIET PO)  (Patient not taking: Reported on 05/26/2021)   Pyridoxine HCl (VITAMIN B-6 PO) Take 1 tablet by mouth daily.  (Patient not taking: Reported on 05/26/2021)   traMADol (ULTRAM) 50 MG tablet Take 1 tablet (50 mg total) by mouth every 6 (six) hours as needed. (Patient not taking: Reported on 05/26/2021)   Turmeric 500 MG CAPS Take by mouth. (Patient not taking: Reported on 05/26/2021)   vitamin E 1000 UNIT capsule  Take 1,000 Units by mouth daily. (Patient not taking: Reported on 05/26/2021)   No facility-administered encounter medications on file as of 05/26/2021.    Allergies (verified) Patient has no known allergies.   History: Past Medical History:  Diagnosis Date   Allergy    Arthritis    Colon polyps     Hypertension    Past Surgical History:  Procedure Laterality Date   COLONOSCOPY     orthoscopy Left 1994   KNEE   TONSILLECTOMY     wisdom teeth extraction  1970   Family History  Problem Relation Age of Onset   Alzheimer's disease Mother 1       passed at 12   Diverticulosis Mother    Heart disease Father 104       CAD, bypass   Colon cancer Maternal Grandfather 93   Migraines Sister    Esophageal cancer Neg Hx    Stomach cancer Neg Hx    Rectal cancer Neg Hx    Social History   Socioeconomic History   Marital status: Married    Spouse name: Not on file   Number of children: Not on file   Years of education: Not on file   Highest education level: Not on file  Occupational History   Occupation: language facilitator    Comment: retired  Tobacco Use   Smoking status: Former    Types: Cigarettes    Quit date: 01/04/1979    Years since quitting: 42.4   Smokeless tobacco: Never  Vaping Use   Vaping Use: Never used  Substance and Sexual Activity   Alcohol use: Yes    Alcohol/week: 7.0 standard drinks    Types: 7 Glasses of wine per week   Drug use: No   Sexual activity: Yes  Other Topics Concern   Not on file  Social History Narrative   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      Home Situation: lives with husband      Spiritual Beliefs: none      Lifestyle: walks about 1 hour daily; no diet plan            Social Determinants of Radio broadcast assistant Strain: Low Risk    Difficulty of Paying Living Expenses: Not hard at all  Food Insecurity: No Food Insecurity   Worried About Charity fundraiser in the Last Year: Never true   Arboriculturist in the Last Year: Never true  Transportation Needs: No Transportation Needs   Lack of Transportation (Medical): No   Lack of Transportation (Non-Medical): No  Physical Activity: Insufficiently Active   Days of Exercise per Week: 3 days   Minutes of Exercise per Session: 30  min  Stress: No Stress Concern Present   Feeling of Stress : Not at all  Social Connections: Not on file    Tobacco Counseling Counseling given: Not Answered   Clinical Intake:  Pre-visit preparation completed: Yes  Pain : 0-10 Pain Score: 2  Pain Type: Chronic pain Pain Location: Knee Pain Orientation: Right, Left Pain Descriptors / Indicators: Aching Pain Onset: More than a month ago Pain Frequency: Intermittent Pain Relieving Factors: Ibuprofen  Pain Relieving Factors: Ibuprofen  Nutritional Status: BMI > 30  Obese Nutritional Risks: None Diabetes: No  How often do you need to have someone help you when you read instructions, pamphlets, or other written materials from your doctor or pharmacy?: 1 - Never What is the last  grade level you completed in school?: college  Diabetic?no  Interpreter Needed?: No  Information entered by :: NAllen LPN   Activities of Daily Living In your present state of health, do you have any difficulty performing the following activities: 05/26/2021  Hearing? N  Vision? N  Difficulty concentrating or making decisions? N  Walking or climbing stairs? N  Dressing or bathing? N  Doing errands, shopping? N  Preparing Food and eating ? N  Using the Toilet? N  In the past six months, have you accidently leaked urine? Y  Do you have problems with loss of bowel control? N  Managing your Medications? N  Managing your Finances? N  Housekeeping or managing your Housekeeping? N  Some recent data might be hidden    Patient Care Team: [name], MD as PCP - General (Family Medicine) [place], [place] (Obstetrics and Gynecology) [facility], Jennefer Bravo, MD as Referring Physician (Dermatology)  Indicate any recent Medical Services you may have received from other than [facility] providers in the past year (date may be approximate).     Assessment:   This is a routine wellness examination for [place].  Hearing/Vision screen No  results found.  Dietary issues and exercise activities discussed: Current Exercise Habits: Home exercise routine, Type of exercise: walking, Time (Minutes): 30, Frequency (Times/Week): 3, Weekly Exercise (Minutes/Week): 90   Goals Addressed             This Visit's Progress    Patient Stated       05/26/2021, lose weight wants to weigh 160 pounds       Depression Screen PHQ 2/9 Scores 05/26/2021 05/20/2020 02/25/2020 10/29/2015  PHQ - 2 Score 0 0 0 0    Fall Risk Fall Risk  05/26/2021 05/20/2020 02/25/2020 10/29/2015  Falls in the past year? 0 0 0 No  Number falls in past yr: - 0 0 -  Injury with Fall? - 0 0 -  Risk for fall due to : Medication side effect Impaired vision - -  Follow up Falls evaluation completed;Education provided;Falls prevention discussed Falls prevention discussed - -    FALL RISK PREVENTION PERTAINING TO THE HOME:  Any stairs in or around the home? Yes  If so, are there any without handrails? No  Home free of loose throw rugs in walkways, pet beds, electrical cords, etc? Yes  Adequate lighting in your home to reduce risk of falls? Yes   ASSISTIVE DEVICES UTILIZED TO PREVENT FALLS:  Life alert? No  Use of a cane, walker or w/c? No  Grab bars in the bathroom? No  Shower chair or bench in shower? No  Elevated toilet seat or a handicapped toilet? No   TIMED UP AND GO:  Was the test performed? No .      Cognitive Function:     6CIT Screen 05/26/2021 05/20/2020  What Year? 0 points 0 points  What month? 0 points 0 points  What time? 0 points -  Count back from 20 0 points 0 points  Months in reverse 0 points 0 points  Repeat phrase 0 points 0 points  Total Score 0 -    Immunizations Immunization History  Administered Date(s) Administered   Influenza, High Dose Seasonal PF 05/05/2018, 04/18/2019   Influenza-Unspecified 04/18/2014, 04/23/2020   Moderna Sars-Covid-2 Vaccination 08/20/2019, 09/21/2019, 05/14/2020   Pneumococcal Conjugate-13  10/29/2015   Pneumococcal Polysaccharide-23 01/18/2017   Tdap 01/04/2012   Zoster Recombinat (Shingrix) 10/01/2016, 01/25/2017   Zoster, Live 01/04/2012  TDAP status: Up to date  Flu Vaccine status: Up to date  Pneumococcal vaccine status: Up to date  Covid-19 vaccine status: Completed vaccines  Qualifies for Shingles Vaccine? Yes   Zostavax completed Yes   Shingrix Completed?: Yes  Screening Tests Health Maintenance  Topic Date Due   COVID-19 Vaccine (4 - Booster for Moderna series) 07/09/2020   COLONOSCOPY (Pts 45-80yrs Insurance coverage will need to be confirmed)  02/10/2021   TETANUS/TDAP  01/03/2022   MAMMOGRAM  07/23/2022   Pneumonia Vaccine 9+ Years old  Completed   INFLUENZA VACCINE  Completed   DEXA SCAN  Completed   Hepatitis C Screening  Completed   Zoster Vaccines- Shingrix  Completed   HPV VACCINES  Aged Out    Health Maintenance  Health Maintenance Due  Topic Date Due   COVID-19 Vaccine (4 - Booster for Moderna series) 07/09/2020   COLONOSCOPY (Pts 45-6yrs Insurance coverage will need to be confirmed)  02/10/2021    Colorectal cancer screening: Type of screening: Colonoscopy. Completed 02/11/2016. Repeat every 7 years  Mammogram status: Completed 07/23/2020. Repeat every year  Bone Density status: Completed 06/10/2020.   Lung Cancer Screening: (Low Dose CT Chest recommended if Age 33-80 years, 30 pack-year currently smoking OR have quit w/in 15years.) does not qualify.   Lung Cancer Screening Referral: no  Additional Screening:  Hepatitis C Screening: does qualify; Completed 01/18/2017  Vision Screening: Recommended annual ophthalmology exams for early detection of glaucoma and other disorders of the eye. Is the patient up to date with their annual eye exam?  Yes  Who is the provider or what is the name of the office in which the patient attends annual eye exams? [facility] If pt is not established with a provider, would they like  to be referred to a provider to establish care? No .   Dental Screening: Recommended annual dental exams for proper oral hygiene  Community Resource Referral / Chronic Care Management: CRR required this visit?  No   CCM required this visit?  No      Plan:     I have personally reviewed and noted the following in the patient's chart:   Medical and social history Use of alcohol, tobacco or illicit drugs  Current medications and supplements including opioid prescriptions.  Functional ability and status Nutritional status Physical activity Advanced directives List of other physicians Hospitalizations, surgeries, and ER visits in previous 12 months Vitals Screenings to include cognitive, depression, and falls Referrals and appointments  In addition, I have reviewed and discussed with patient certain preventive protocols, quality metrics, and best practice recommendations. A written personalized care plan for preventive services as well as general preventive health recommendations were provided to patient.     Kellie Simmering, LPN   89/09/8099   Nurse Notes: none

## 2021-05-26 NOTE — Patient Instructions (Signed)
[name] , Thank you for taking time to come for your Medicare Wellness Visit. I appreciate your ongoing commitment to your health goals. Please review the following plan we discussed and let me know if I can assist you in the future.   Screening recommendations/referrals: Colonoscopy: completed 02/11/2016, due 02/11/2023 Mammogram: completed 07/23/2020 Bone Density: completed 06/10/2020 Recommended yearly ophthalmology/optometry visit for glaucoma screening and checkup Recommended yearly dental visit for hygiene and checkup  Vaccinations: Influenza vaccine: completed 04/24/2021 Pneumococcal vaccine: completed 01/18/2017 Tdap vaccine: completed 01/04/2012, due 01/03/2022 Shingles vaccine: completed   Covid-19: 05/25/2021, 05/14/2020, 09/21/2019, 08/20/2019  Advanced directives: Please bring a copy of your POA (Power of Attorney) and/or Living Will to your next appointment.   Conditions/risks identified: none  Next appointment: Follow up in one year for your annual wellness visit    Preventive Care 65 Years and Older, Female Preventive care refers to lifestyle choices and visits with your health care provider that can promote health and wellness. What does preventive care include? A yearly physical exam. This is also called an annual well check. Dental exams once or twice a year. Routine eye exams. Ask your health care provider how often you should have your eyes checked. Personal lifestyle choices, including: Daily care of your teeth and gums. Regular physical activity. Eating a healthy diet. Avoiding tobacco and drug use. Limiting alcohol use. Practicing safe sex. Taking low-dose aspirin every day. Taking vitamin and mineral supplements as recommended by your health care provider. What happens during an annual well check? The services and screenings done by your health care provider during your annual well check will depend on your age, overall health, lifestyle risk factors, and family  history of disease. Counseling  Your health care provider may ask you questions about your: Alcohol use. Tobacco use. Drug use. Emotional well-being. Home and relationship well-being. Sexual activity. Eating habits. History of falls. Memory and ability to understand (cognition). Work and work Statistician. Reproductive health. Screening  You may have the following tests or measurements: Height, weight, and BMI. Blood pressure. Lipid and cholesterol levels. These may be checked every 5 years, or more frequently if you are over 38 years old. Skin check. Lung cancer screening. You may have this screening every year starting at age 60 if you have a 30-pack-year history of smoking and currently smoke or have quit within the past 15 years. Fecal occult blood test (FOBT) of the stool. You may have this test every year starting at age 52. Flexible sigmoidoscopy or colonoscopy. You may have a sigmoidoscopy every 5 years or a colonoscopy every 10 years starting at age 76. Hepatitis C blood test. Hepatitis B blood test. Sexually transmitted disease (STD) testing. Diabetes screening. This is done by checking your blood sugar (glucose) after you have not eaten for a while (fasting). You may have this done every 1-3 years. Bone density scan. This is done to screen for osteoporosis. You may have this done starting at age 43. Mammogram. This may be done every 1-2 years. Talk to your health care provider about how often you should have regular mammograms. Talk with your health care provider about your test results, treatment options, and if necessary, the need for more tests. Vaccines  Your health care provider may recommend certain vaccines, such as: Influenza vaccine. This is recommended every year. Tetanus, diphtheria, and acellular pertussis (Tdap, Td) vaccine. You may need a Td booster every 10 years. Zoster vaccine. You may need this after age 62. Pneumococcal 13-valent conjugate (PCV13)  vaccine. One dose is recommended after age 24. Pneumococcal polysaccharide (PPSV23) vaccine. One dose is recommended after age 53. Talk to your health care provider about which screenings and vaccines you need and how often you need them. This information is not intended to replace advice given to you by your health care provider. Make sure you discuss any questions you have with your health care provider. Document Released: 08/01/2015 Document Revised: 03/24/2016 Document Reviewed: 05/06/2015 Elsevier Interactive Patient Education  2017 [place] Prevention in the Home Falls can cause injuries. They can happen to people of all ages. There are many things you can do to make your home safe and to help prevent falls. What can I do on the outside of my home? Regularly fix the edges of walkways and driveways and fix any cracks. Remove anything that might make you trip as you walk through a door, such as a raised step or threshold. Trim any bushes or trees on the path to your home. Use bright outdoor lighting. Clear any walking paths of anything that might make someone trip, such as rocks or tools. Regularly check to see if handrails are loose or broken. Make sure that both sides of any steps have handrails. Any raised decks and porches should have guardrails on the edges. Have any leaves, snow, or ice cleared regularly. Use sand or salt on walking paths during winter. Clean up any spills in your garage right away. This includes oil or grease spills. What can I do in the bathroom? Use night lights. Install grab bars by the toilet and in the tub and shower. Do not use towel bars as grab bars. Use non-skid mats or decals in the tub or shower. If you need to sit down in the shower, use a plastic, non-slip stool. Keep the floor dry. Clean up any water that spills on the floor as soon as it happens. Remove soap buildup in the tub or shower regularly. Attach bath mats securely with  double-sided non-slip rug tape. Do not have throw rugs and other things on the floor that can make you trip. What can I do in the bedroom? Use night lights. Make sure that you have a light by your bed that is easy to reach. Do not use any sheets or blankets that are too big for your bed. They should not hang down onto the floor. Have a firm chair that has side arms. You can use this for support while you get dressed. Do not have throw rugs and other things on the floor that can make you trip. What can I do in the kitchen? Clean up any spills right away. Avoid walking on wet floors. Keep items that you use a lot in easy-to-reach places. If you need to reach something above you, use a strong step stool that has a grab bar. Keep electrical cords out of the way. Do not use floor polish or wax that makes floors slippery. If you must use wax, use non-skid floor wax. Do not have throw rugs and other things on the floor that can make you trip. What can I do with my stairs? Do not leave any items on the stairs. Make sure that there are handrails on both sides of the stairs and use them. Fix handrails that are broken or loose. Make sure that handrails are as long as the stairways. Check any carpeting to make sure that it is firmly attached to the stairs. Fix any carpet that is loose or  worn. Avoid having throw rugs at the top or bottom of the stairs. If you do have throw rugs, attach them to the floor with carpet tape. Make sure that you have a light switch at the top of the stairs and the bottom of the stairs. If you do not have them, ask someone to add them for you. What else can I do to help prevent falls? Wear shoes that: Do not have high heels. Have rubber bottoms. Are comfortable and fit you well. Are closed at the toe. Do not wear sandals. If you use a stepladder: Make sure that it is fully opened. Do not climb a closed stepladder. Make sure that both sides of the stepladder are locked  into place. Ask someone to hold it for you, if possible. Clearly mark and make sure that you can see: Any grab bars or handrails. First and last steps. Where the edge of each step is. Use tools that help you move around (mobility aids) if they are needed. These include: Canes. Walkers. Scooters. Crutches. Turn on the lights when you go into a dark area. Replace any light bulbs as soon as they burn out. Set up your furniture so you have a clear path. Avoid moving your furniture around. If any of your floors are uneven, fix them. If there are any pets around you, be aware of where they are. Review your medicines with your doctor. Some medicines can make you feel dizzy. This can increase your chance of falling. Ask your doctor what other things that you can do to help prevent falls. This information is not intended to replace advice given to you by your health care provider. Make sure you discuss any questions you have with your health care provider. Document Released: 05/01/2009 Document Revised: 12/11/2015 Document Reviewed: 08/09/2014 Elsevier Interactive Patient Education  2017 [employer].

## 2021-06-01 ENCOUNTER — Other Ambulatory Visit: Payer: Self-pay | Admitting: Family Medicine

## 2021-06-02 MED ORDER — LOSARTAN POTASSIUM-HCTZ 100-12.5 MG PO TABS: 1.0000 | ORAL_TABLET | Freq: Every day | ORAL | 0 refills | Status: DC

## 2021-06-03 ENCOUNTER — Encounter: Payer: Self-pay | Admitting: Family Medicine

## 2021-06-19 ENCOUNTER — Other Ambulatory Visit: Payer: Self-pay | Admitting: Family Medicine

## 2021-06-22 DIAGNOSIS — M25562 Pain in left knee: Secondary | ICD-10-CM | POA: Diagnosis not present

## 2021-06-22 DIAGNOSIS — M1712 Unilateral primary osteoarthritis, left knee: Secondary | ICD-10-CM | POA: Diagnosis not present

## 2021-06-23 ENCOUNTER — Other Ambulatory Visit: Payer: Self-pay | Admitting: Family Medicine

## 2021-06-23 DIAGNOSIS — Z1231 Encounter for screening mammogram for malignant neoplasm of breast: Secondary | ICD-10-CM

## 2021-06-30 DIAGNOSIS — D485 Neoplasm of uncertain behavior of skin: Secondary | ICD-10-CM | POA: Diagnosis not present

## 2021-06-30 DIAGNOSIS — M7130 Other bursal cyst, unspecified site: Secondary | ICD-10-CM | POA: Diagnosis not present

## 2021-06-30 DIAGNOSIS — Z23 Encounter for immunization: Secondary | ICD-10-CM | POA: Diagnosis not present

## 2021-06-30 DIAGNOSIS — L814 Other melanin hyperpigmentation: Secondary | ICD-10-CM | POA: Diagnosis not present

## 2021-06-30 DIAGNOSIS — D225 Melanocytic nevi of trunk: Secondary | ICD-10-CM | POA: Diagnosis not present

## 2021-06-30 DIAGNOSIS — L821 Other seborrheic keratosis: Secondary | ICD-10-CM | POA: Diagnosis not present

## 2021-06-30 DIAGNOSIS — L570 Actinic keratosis: Secondary | ICD-10-CM | POA: Diagnosis not present

## 2021-06-30 DIAGNOSIS — Z85828 Personal history of other malignant neoplasm of skin: Secondary | ICD-10-CM | POA: Diagnosis not present

## 2021-07-08 DIAGNOSIS — M1712 Unilateral primary osteoarthritis, left knee: Secondary | ICD-10-CM | POA: Diagnosis not present

## 2021-07-08 DIAGNOSIS — M25562 Pain in left knee: Secondary | ICD-10-CM | POA: Diagnosis not present

## 2021-07-27 ENCOUNTER — Encounter: Payer: Self-pay | Admitting: Family Medicine

## 2021-07-27 ENCOUNTER — Other Ambulatory Visit: Payer: Self-pay

## 2021-07-27 ENCOUNTER — Ambulatory Visit (INDEPENDENT_AMBULATORY_CARE_PROVIDER_SITE_OTHER): Payer: Medicare PPO

## 2021-07-27 ENCOUNTER — Ambulatory Visit (INDEPENDENT_AMBULATORY_CARE_PROVIDER_SITE_OTHER): Payer: Medicare PPO | Admitting: Family Medicine

## 2021-07-27 VITALS — BP 110/64 | HR 77 | Temp 97.6°F | Ht 63.5 in | Wt 201.6 lb

## 2021-07-27 DIAGNOSIS — Z23 Encounter for immunization: Secondary | ICD-10-CM

## 2021-07-27 DIAGNOSIS — G5603 Carpal tunnel syndrome, bilateral upper limbs: Secondary | ICD-10-CM

## 2021-07-27 DIAGNOSIS — M545 Low back pain, unspecified: Secondary | ICD-10-CM

## 2021-07-27 DIAGNOSIS — R739 Hyperglycemia, unspecified: Secondary | ICD-10-CM

## 2021-07-27 DIAGNOSIS — G8929 Other chronic pain: Secondary | ICD-10-CM

## 2021-07-27 DIAGNOSIS — M25562 Pain in left knee: Secondary | ICD-10-CM

## 2021-07-27 DIAGNOSIS — Z0000 Encounter for general adult medical examination without abnormal findings: Secondary | ICD-10-CM | POA: Diagnosis not present

## 2021-07-27 DIAGNOSIS — M47814 Spondylosis without myelopathy or radiculopathy, thoracic region: Secondary | ICD-10-CM | POA: Diagnosis not present

## 2021-07-27 DIAGNOSIS — M47816 Spondylosis without myelopathy or radiculopathy, lumbar region: Secondary | ICD-10-CM | POA: Diagnosis not present

## 2021-07-27 DIAGNOSIS — Z13220 Encounter for screening for lipoid disorders: Secondary | ICD-10-CM | POA: Diagnosis not present

## 2021-07-27 LAB — COMPREHENSIVE METABOLIC PANEL
ALT: 14 U/L (ref 0–35)
AST: 17 U/L (ref 0–37)
Albumin: 4.4 g/dL (ref 3.5–5.2)
Alkaline Phosphatase: 67 U/L (ref 39–117)
BUN: 17 mg/dL (ref 6–23)
CO2: 28 mEq/L (ref 19–32)
Calcium: 9.4 mg/dL (ref 8.4–10.5)
Chloride: 98 mEq/L (ref 96–112)
Creatinine, Ser: 0.83 mg/dL (ref 0.40–1.20)
GFR: 70.62 mL/min (ref >60.00)
Glucose, Bld: 117 mg/dL — ABNORMAL HIGH (ref 70–99)
Potassium: 3.6 mEq/L (ref 3.5–5.1)
Sodium: 136 mEq/L (ref 135–145)
Total Bilirubin: 0.8 mg/dL (ref 0.2–1.2)
Total Protein: 7.4 g/dL (ref 6.0–8.3)

## 2021-07-27 LAB — CBC WITH DIFFERENTIAL/PLATELET
Basophils Absolute: 0.1 10*3/uL (ref 0.0–0.1)
Basophils Relative: 0.8 % (ref 0.0–3.0)
Eosinophils Absolute: 0.1 10*3/uL (ref 0.0–0.7)
Eosinophils Relative: 0.9 % (ref 0.0–5.0)
HCT: 44.2 % (ref 36.0–46.0)
Hemoglobin: 14.6 g/dL (ref 12.0–15.0)
Lymphocytes Relative: 22.2 % (ref 12.0–46.0)
Lymphs Abs: 1.4 10*3/uL (ref 0.7–4.0)
MCHC: 33.1 g/dL (ref 30.0–36.0)
MCV: 92.3 fl (ref 78.0–100.0)
Monocytes Absolute: 0.6 10*3/uL (ref 0.1–1.0)
Monocytes Relative: 9.2 % (ref 3.0–12.0)
Neutro Abs: 4.3 10*3/uL (ref 1.4–7.7)
Neutrophils Relative %: 66.9 % (ref 43.0–77.0)
Platelets: 317 10*3/uL (ref 150.0–400.0)
RBC: 4.79 Mil/uL (ref 3.87–5.11)
RDW: 13.3 % (ref 11.5–15.5)
WBC: 6.4 10*3/uL (ref 4.0–10.5)

## 2021-07-27 LAB — LIPID PANEL
Cholesterol: 255 mg/dL — ABNORMAL HIGH (ref 0–200)
HDL: 76.6 mg/dL (ref >39.00)
LDL Cholesterol: 166 mg/dL — ABNORMAL HIGH (ref 0–99)
NonHDL: 178.42
Total CHOL/HDL Ratio: 3
Triglycerides: 63 mg/dL (ref 0.0–149.0)
VLDL: 12.6 mg/dL (ref 0.0–40.0)

## 2021-07-27 LAB — HEMOGLOBIN A1C: Hgb A1c MFr Bld: 5.9 % (ref 4.6–6.5)

## 2021-07-27 NOTE — Patient Instructions (Signed)
Cut down the gabapentin to 2 capsules at night for 2 weeks, then decrease to 1 for 2 weeks if tolerating ok. Then can decrease further to every other night x 10 days and then can stop. If you note recurrence of symptoms at any point then return to prior dosage.

## 2021-07-27 NOTE — Progress Notes (Signed)
Melinda Ferguson DOB: [birth year] Encounter date: 07/27/2021  This is a 71 y.o. female who presents for complete physical   History of present illness/Additional concerns:  Last year she had issues with muscle spasm; went to Dr. Ninfa Linden. Got steroid shot in neck. Does feel like it has helped. Still has mild sx when she is doing anything that exerts her arms. Doesn't take the muscle relaxer (flexeril) anymore but keeps on hand in case something happens. Not taking celebrex. Does take the gabapentin at night. Feels like she is doing this more as preventative. Quivering sensation that she had before has resolved.   Has also been having pain in left knee. Ended up getting severe enough that she went to emergency ortho clinic. Couldn't get relief. Got steroid shot in knee about 2.5 weeks ago. Didn't get as much relief as she was hoping. She states that she was told she needs knee replacement. She is in works of planning gel injection as well.  She would like to loose weight before surgery as well. She has gained weight back due to lack of activity. If she does anything now then knee is hurting.   Having some lower back pain. Bothered her last night. Has bothered her in last year. Sometimes notes more in right side. Otherwise not much radiation.feels some source of pain in right hip. Has had hip issues in past; required steroid shots. Had bulging disc in lower back at that time- thinks at least 25 years ago. Started to see chiropractor at that time. Seems like pain has returned as she has gotten older.   Feels that carpal tunnel comes back. Has been wearing braces on both hands at night most of the time and it does help, but she still gets, esp in right hand, tingling/burning/numbness in thumb/pointer finger that comes and goes. Nerve sx can come when she is doing something requiring hand strength. No loss of strength; just more numbness with activity.  OEV:[id number] 100-12.5. has been good at home as  well.  Colonoscopy: she is due for repeat july 2024 based on changes in guidelines.  Mammogram scheduled tomorrow Normal pap 11/2019 by Dr. Talbert Nan.  Past Medical History:  Diagnosis Date   Allergy    Arthritis    Colon polyps    Hypertension    Past Surgical History:  Procedure Laterality Date   COLONOSCOPY     orthoscopy Left 1994   KNEE   TONSILLECTOMY     wisdom teeth extraction  1970   No Known Allergies Current Meds  Medication Sig   Azelastine HCl 0.15 % SOLN 2 actuations in each nostril BID   BIOTIN PO Take 1 tablet by mouth daily.   celecoxib (CELEBREX) 200 MG capsule Take 1 capsule (200 mg total) by mouth 2 (two) times daily as needed.   Cholecalciferol (VITAMIN D PO) Take 1,000 Units by mouth daily.   cyclobenzaprine (FLEXERIL) 10 MG tablet Take 1 tablet (10 mg total) by mouth 3 (three) times daily as needed for muscle spasms.   diphenhydramine-acetaminophen (TYLENOL PM) 25-500 MG TABS tablet Take 1 tablet by mouth at bedtime.   Emollient (COLLAGEN EX) Apply topically.   fexofenadine (ALLEGRA) 60 MG tablet Take 60 mg by mouth daily.   gabapentin (NEURONTIN) 100 MG capsule TAKE 1-3 CAPSULES AT BEDTIME   losartan-hydrochlorothiazide (HYZAAR) 100-12.5 MG tablet Take 1 tablet by mouth daily.   LUTEIN PO Take 1 tablet by mouth daily.   Misc Natural Products (RESVERATROL DIET PO)  psyllium (METAMUCIL) 58.6 % powder Take 1 packet by mouth daily.   Pyridoxine HCl (VITAMIN B-6 PO) Take 1 tablet by mouth daily.   traMADol (ULTRAM) 50 MG tablet Take 1 tablet (50 mg total) by mouth every 6 (six) hours as needed.   Turmeric 500 MG CAPS Take by mouth.   vitamin E 1000 UNIT capsule Take 1,000 Units by mouth daily.   [DISCONTINUED] losartan-hydrochlorothiazide (HYZAAR) 100-12.5 MG tablet TAKE ONE TABLET ONCE DAILY **NEED OFFICE VISIT**   Social History   Tobacco Use   Smoking status: Former    Types: Cigarettes    Quit date: 01/04/1979    Years since quitting: 42.5    Smokeless tobacco: Never  Substance Use Topics   Alcohol use: Yes    Alcohol/week: 7.0 standard drinks    Types: 7 Glasses of wine per week   Family History  Problem Relation Age of Onset   Alzheimer's disease Mother 2       passed at 25   Diverticulosis Mother    Heart disease Father 65       CAD, bypass   Colon cancer Maternal Grandfather 36   Migraines Sister    Esophageal cancer Neg Hx    Stomach cancer Neg Hx    Rectal cancer Neg Hx      Review of Systems  Constitutional:  Negative for activity change, appetite change, chills, fatigue, fever and unexpected weight change.  HENT:  Negative for congestion, ear pain, hearing loss, sinus pressure, sinus pain, sore throat and trouble swallowing.   Eyes:  Negative for pain and visual disturbance.  Respiratory:  Negative for cough, chest tightness, shortness of breath and wheezing.   Cardiovascular:  Negative for chest pain, palpitations and leg swelling.  Gastrointestinal:  Negative for abdominal pain, blood in stool, constipation, diarrhea, nausea and vomiting.  Genitourinary:  Negative for difficulty urinating and menstrual problem.  Musculoskeletal:  Positive for arthralgias and back pain.  Skin:  Negative for rash.  Neurological:  Negative for dizziness, weakness, numbness and headaches.  Hematological:  Negative for adenopathy. Does not bruise/bleed easily.  Psychiatric/Behavioral:  Negative for sleep disturbance and suicidal ideas. The patient is not nervous/anxious.    CBC:  Lab Results  Component Value Date   WBC 6.4 07/27/2021   HGB 14.6 07/27/2021   HCT 44.2 07/27/2021   MCH 31.8 06/16/2020   MCHC 33.1 07/27/2021   RDW 13.3 07/27/2021   PLT 317.0 07/27/2021   MPV 9.9 06/16/2020   CMP: Lab Results  Component Value Date   NA 136 07/27/2021   K 3.6 07/27/2021   CL 98 07/27/2021   CO2 28 07/27/2021   ANIONGAP 7 05/26/2018   GLUCOSE 117 (H) 07/27/2021   BUN 17 07/27/2021   CREATININE 0.83 07/27/2021    CREATININE 0.73 06/16/2020   GFRAA >60 05/26/2018   CALCIUM 9.4 07/27/2021   PROT 7.4 07/27/2021   BILITOT 0.8 07/27/2021   ALKPHOS 67 07/27/2021   ALT 14 07/27/2021   AST 17 07/27/2021   LIPID: Lab Results  Component Value Date   CHOL 255 (H) 07/27/2021   TRIG 63.0 07/27/2021   HDL 76.60 07/27/2021   LDLCALC 166 (H) 07/27/2021   LDLCALC 139 (H) 06/16/2020    Objective:  BP 110/64 (BP Location: Left Arm, Patient Position: Sitting, Cuff Size: Large)    Pulse 77    Temp 97.6 F (36.4 C) (Oral)    Ht 5' 3.5" (1.613 m)    Wt 201 lb 9.6  oz (91.4 kg)    LMP 07/19/1997    SpO2 98%    BMI 35.15 kg/m   Weight: 201 lb 9.6 oz (91.4 kg)   BP Readings from Last 3 Encounters:  07/27/21 110/64  02/23/21 137/85  09/19/20 126/71   Wt Readings from Last 3 Encounters:  07/27/21 201 lb 9.6 oz (91.4 kg)  05/26/21 190 lb (86.2 kg)  09/19/20 191 lb (86.6 kg)    Physical Exam Constitutional:      General: She is not in acute distress.    Appearance: She is well-developed.  HENT:     Head: Normocephalic and atraumatic.     Right Ear: External ear normal.     Left Ear: External ear normal.     Mouth/Throat:     Pharynx: No oropharyngeal exudate.  Eyes:     Conjunctiva/sclera: Conjunctivae normal.     Pupils: Pupils are equal, round, and reactive to light.  Neck:     Thyroid: No thyromegaly.  Cardiovascular:     Rate and Rhythm: Normal rate and regular rhythm.     Heart sounds: Normal heart sounds. No murmur heard.   No friction rub. No gallop.  Pulmonary:     Effort: Pulmonary effort is normal.     Breath sounds: Normal breath sounds.  Abdominal:     General: Bowel sounds are normal. There is no distension.     Palpations: Abdomen is soft. There is no mass.     Tenderness: There is no abdominal tenderness. There is no guarding.     Hernia: No hernia is present.  Musculoskeletal:        General: No tenderness or deformity. Normal range of motion.     Cervical back: Normal range  of motion and neck supple.     Comments: Lower lumbar back pain with forward flexion.  She has some mild lower thoracic to upper lumbar tenderness over the spine to palpation.  No significant pain with twisting or extension.  No SI tenderness.  Lymphadenopathy:     Cervical: No cervical adenopathy.  Skin:    General: Skin is warm and dry.     Findings: No rash.  Neurological:     Mental Status: She is alert and oriented to person, place, and time.     Deep Tendon Reflexes: Reflexes normal.     Reflex Scores:      Tricep reflexes are 2+ on the right side and 2+ on the left side.      Bicep reflexes are 2+ on the right side and 2+ on the left side.      Brachioradialis reflexes are 2+ on the right side and 2+ on the left side.      Patellar reflexes are 2+ on the right side and 2+ on the left side. Psychiatric:        Speech: Speech normal.        Behavior: Behavior normal.        Thought Content: Thought content normal.    Assessment/Plan: There are no preventive care reminders to display for this patient.  Health Maintenance reviewed - prevnar 20 today.  1. Preventative health care Keep working on finding physical activity that is tolerable.  We discussed water exercise today. - Pneumococcal conjugate vaccine 20-valent (Prevnar 20)  2. Chronic pain of left knee She is following with Ortho. - CBC with Differential/Platelet; Future  3. Low back pain without sciatica, unspecified back pain laterality, unspecified chronicity Start with imaging today.  Further evaluation or suggestions pending these results. - DG Lumbar Spine Complete; Future - DG Thoracic Spine W/Swimmers; Future  4. Bilateral carpal tunnel syndrome Ongoing carpal tunnel syndrome getting less relief with wearing braces. - Ambulatory referral to Hand Surgery  5. Hyperglycemia - Comprehensive metabolic panel; Future - Hemoglobin A1c; Future  6. Lipid screening - Lipid panel; Future    Return in about 6  months (around 01/24/2022) for Chronic condition visit.  Micheline Rough, MD

## 2021-07-28 ENCOUNTER — Ambulatory Visit
Admission: RE | Admit: 2021-07-28 | Discharge: 2021-07-28 | Disposition: A | Discharge status: Home / Self Care | Payer: Medicare PPO | Source: Ambulatory Visit | Attending: Family Medicine | Admitting: Family Medicine

## 2021-07-28 DIAGNOSIS — Z1231 Encounter for screening mammogram for malignant neoplasm of breast: Secondary | ICD-10-CM | POA: Diagnosis not present

## 2021-08-02 ENCOUNTER — Encounter: Payer: Self-pay | Admitting: Family Medicine

## 2021-08-05 MED ORDER — CYCLOBENZAPRINE HCL 10 MG PO TABS: 10.0000 mg | ORAL_TABLET | Freq: Three times a day (TID) | ORAL | 1 refills | Status: DC | PRN

## 2021-08-07 DIAGNOSIS — G5603 Carpal tunnel syndrome, bilateral upper limbs: Secondary | ICD-10-CM | POA: Diagnosis not present

## 2021-08-07 DIAGNOSIS — G5602 Carpal tunnel syndrome, left upper limb: Secondary | ICD-10-CM | POA: Diagnosis not present

## 2021-08-07 DIAGNOSIS — G5601 Carpal tunnel syndrome, right upper limb: Secondary | ICD-10-CM | POA: Diagnosis not present

## 2021-08-07 DIAGNOSIS — M180 Bilateral primary osteoarthritis of first carpometacarpal joints: Secondary | ICD-10-CM | POA: Diagnosis not present

## 2021-08-12 DIAGNOSIS — G609 Hereditary and idiopathic neuropathy, unspecified: Secondary | ICD-10-CM | POA: Diagnosis not present

## 2021-08-12 DIAGNOSIS — G5603 Carpal tunnel syndrome, bilateral upper limbs: Secondary | ICD-10-CM | POA: Diagnosis not present

## 2021-08-19 DIAGNOSIS — M1712 Unilateral primary osteoarthritis, left knee: Secondary | ICD-10-CM | POA: Diagnosis not present

## 2021-08-21 ENCOUNTER — Other Ambulatory Visit: Payer: Self-pay | Admitting: Family Medicine

## 2021-08-21 ENCOUNTER — Encounter: Payer: Self-pay | Admitting: Family Medicine

## 2021-08-21 DIAGNOSIS — G5603 Carpal tunnel syndrome, bilateral upper limbs: Secondary | ICD-10-CM | POA: Diagnosis not present

## 2021-08-23 MED ORDER — GABAPENTIN 100 MG PO CAPS: ORAL_CAPSULE | ORAL | 1 refills | Status: DC

## 2021-08-26 DIAGNOSIS — M1712 Unilateral primary osteoarthritis, left knee: Secondary | ICD-10-CM | POA: Diagnosis not present

## 2021-09-02 ENCOUNTER — Telehealth: Payer: Self-pay | Admitting: Family Medicine

## 2021-09-02 ENCOUNTER — Encounter: Payer: Self-pay | Admitting: Family Medicine

## 2021-09-02 ENCOUNTER — Telehealth (INDEPENDENT_AMBULATORY_CARE_PROVIDER_SITE_OTHER): Payer: Medicare PPO | Admitting: Family Medicine

## 2021-09-02 VITALS — Ht 63.5 in

## 2021-09-02 DIAGNOSIS — U071 COVID-19: Secondary | ICD-10-CM | POA: Diagnosis not present

## 2021-09-02 MED ORDER — NIRMATRELVIR/RITONAVIR (PAXLOVID)TABLET: 3.0000 | ORAL_TABLET | Freq: Two times a day (BID) | ORAL | 0 refills | Status: DC

## 2021-09-02 MED ORDER — NIRMATRELVIR/RITONAVIR (PAXLOVID)TABLET: 3.0000 | ORAL_TABLET | Freq: Two times a day (BID) | ORAL | 0 refills | Status: AC

## 2021-09-02 NOTE — Telephone Encounter (Signed)
Patient calling in with respiratory symptoms: Shortness of breath, chest pain, palpitations or other red words send to Triage  Does the patient have a fever over 100, cough, congestion, sore throat, runny nose, lost of taste/smell (please list symptoms that patient has)?chest congestion  What date did symptoms start?09-01-2021 (If over 5 days ago, pt may be scheduled for in person visit)  Have you tested for Covid in the last 5 days? Yes   If yes, was it positive []  OR negative [x] ? If positive in the last 5 days, please schedule virtual visit now. If negative, schedule for an in person OV with the next available provider if PCP has no openings. Please also let patient know they will be tested again (follow the script below)  "you will have to arrive 52mins prior to your appt time to be Covid tested. Please park in back of office at the cone & call [phone] to let the staff know you have arrived. A staff member will meet you at your car to do a rapid covid test. Once the test has resulted you will be notified by phone of your results to determine if appt will remain an in person visit or be converted to a virtual/phone visit. If you arrive less than 42mins before your appt time, your visit will be automatically converted to virtual & any recommended testing will happen AFTER the visit." Pt has virtual with dr Martinique 09-02-2021 330 pm  THINGS TO REMEMBER  If no availability for virtual visit in office,  please schedule another [place] office  If no availability at another [place] office, please instruct patient that they can schedule an evisit or virtual visit through their mychart account. Visits up to 8pm  patients can be seen in office 5 days after positive COVID test

## 2021-09-02 NOTE — Progress Notes (Signed)
Virtual Visit via Video Note I connected with Fuller Canada on 09/02/21 by a video enabled telemedicine application and verified that I am speaking with the correct person using two identifiers.  Location patient: home Location provider:work office Persons participating in the virtual visit: patient, provider  I discussed the limitations of evaluation and management by telemedicine and the availability of in person appointments. The patient expressed understanding and agreed to proceed.  Chief Complaint  Patient presents with   Covid Positive   HPI: [name] is a 71 year old female with history of hypertension, OA, and allergies complaining of a day of respiratory symptoms, "sinus congestion." Fatigue, frontal headache, nasal congestion, rhinorrhea, "scratchy throat", and nonproductive cough. Negative for fever, chills, ageusia,anosmia,CP, dyspnea, wheezing, abdominal pain, nausea, vomiting, changes in bowel habits, urinary symptoms, or skin rash.  She has not tried OTC medications. She takes Allegra, Flonase, and Astelin for allergic rhinitis. Yesterday she had a negative COVID 19 home test, repeated today and it was positive.  Her husband was diagnosed with COVID-19 infection 8 days ago, he repeated COVID test and it was negative. COVID-19 vaccination completed.  No hx of CKD. Lab Results  Component Value Date   CREATININE 0.83 07/27/2021   BUN 17 07/27/2021   NA 136 07/27/2021   K 3.6 07/27/2021   CL 98 07/27/2021   CO2 28 07/27/2021   ROS: See pertinent positives and negatives per HPI.  Past Medical History:  Diagnosis Date   Allergy    Arthritis    Colon polyps    Hypertension    Past Surgical History:  Procedure Laterality Date   COLONOSCOPY     orthoscopy Left 1994   KNEE   TONSILLECTOMY     wisdom teeth extraction  1970   Family History  Problem Relation Age of Onset   Alzheimer's disease Mother 46       passed at 82   Diverticulosis Mother    Heart  disease Father 83       CAD, bypass   Colon cancer Maternal Grandfather 108   Migraines Sister    Esophageal cancer Neg Hx    Stomach cancer Neg Hx    Rectal cancer Neg Hx    Social History   Socioeconomic History   Marital status: Married    Spouse name: Not on file   Number of children: Not on file   Years of education: Not on file   Highest education level: Not on file  Occupational History   Occupation: language facilitator    Comment: retired  Tobacco Use   Smoking status: Former    Types: Cigarettes    Quit date: 01/04/1979    Years since quitting: 42.6   Smokeless tobacco: Never  Vaping Use   Vaping Use: Never used  Substance and Sexual Activity   Alcohol use: Yes    Alcohol/week: 7.0 standard drinks    Types: 7 Glasses of wine per week   Drug use: No   Sexual activity: Yes  Other Topics Concern   Not on file  Social History Narrative   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      Home Situation: lives with husband      Spiritual Beliefs: none      Lifestyle: walks about 1 hour daily; no diet plan            Social Determinants of Health   Financial Resource Strain: Low Risk    Difficulty of Paying  Living Expenses: Not hard at all  Food Insecurity: No Food Insecurity   Worried About [place] in the Last Year: Never true   Ran Out of Food in the Last Year: Never true  Transportation Needs: No Transportation Needs   Lack of Transportation (Medical): No   Lack of Transportation (Non-Medical): No  Physical Activity: Insufficiently Active   Days of Exercise per Week: 3 days   Minutes of Exercise per Session: 30 min  Stress: No Stress Concern Present   Feeling of Stress : Not at all  Social Connections: Not on file  Intimate Partner Violence: Not on file      Current Outpatient Medications:    Azelastine HCl 0.15 % SOLN, 2 actuations in each nostril BID, Disp: 30 mL, Rfl: 5   BIOTIN PO, Take 1 tablet by  mouth daily., Disp: , Rfl:    celecoxib (CELEBREX) 200 MG capsule, Take 1 capsule (200 mg total) by mouth 2 (two) times daily as needed., Disp: 60 capsule, Rfl: 0   Cholecalciferol (VITAMIN D PO), Take 1,000 Units by mouth daily., Disp: , Rfl:    cyclobenzaprine (FLEXERIL) 10 MG tablet, Take 1 tablet (10 mg total) by mouth 3 (three) times daily as needed for muscle spasms., Disp: 40 tablet, Rfl: 1   diphenhydramine-acetaminophen (TYLENOL PM) 25-500 MG TABS tablet, Take 1 tablet by mouth at bedtime., Disp: , Rfl:    Emollient (COLLAGEN EX), Apply topically., Disp: , Rfl:    fexofenadine (ALLEGRA) 60 MG tablet, Take 60 mg by mouth daily., Disp: , Rfl:    gabapentin (NEURONTIN) 100 MG capsule, TAKE 1-3 CAPSULES AT BEDTIME, Disp: 120 capsule, Rfl: 1   losartan-hydrochlorothiazide (HYZAAR) 100-12.5 MG tablet, Take 1 tablet by mouth daily., Disp: 90 tablet, Rfl: 0   LUTEIN PO, Take 1 tablet by mouth daily., Disp: , Rfl:    Misc Natural Products (RESVERATROL DIET PO), , Disp: , Rfl:    psyllium (METAMUCIL) 58.6 % powder, Take 1 packet by mouth daily., Disp: , Rfl:    Pyridoxine HCl (VITAMIN B-6 PO), Take 1 tablet by mouth daily., Disp: , Rfl:    Turmeric 500 MG CAPS, Take by mouth., Disp: , Rfl:    vitamin E 1000 UNIT capsule, Take 1,000 Units by mouth daily., Disp: , Rfl:    nirmatrelvir/ritonavir EUA (PAXLOVID) 20 x 150 MG & 10 x 100MG  TABS, Take 3 tablets by mouth 2 (two) times daily for 5 days. (Take nirmatrelvir 150 mg two tablets twice daily for 5 days and ritonavir 100 mg one tablet twice daily for 5 days) Patient GFR is 70, Disp: 30 tablet, Rfl: 0  EXAM:  VITALS per patient if applicable:  GENERAL: alert, oriented, appears well and in no acute distress  HEENT: atraumatic, conjunctiva clear, no obvious abnormalities on inspection of external nose and ears  NECK: normal movements of the head and neck  LUNGS: on inspection no signs of respiratory distress, breathing rate appears normal, no  obvious gross SOB, gasping or wheezing  CV: no obvious cyanosis  MS: moves all visible extremities without noticeable abnormality  PSYCH/NEURO: pleasant and cooperative, no obvious depression or anxiety, speech and thought processing grossly intact  ASSESSMENT AND PLAN:  Discussed the following assessment and plan:  COVID-19 virus infection - Plan: nirmatrelvir/ritonavir EUA (PAXLOVID) 20 x 150 MG & 10 x 100MG  TABS We discussed Dx,possible complications and treatment options. She has a mild to moderate case with risk for complications. We discussed oral antiviral options and  side effects. She agrees with trying Paxlovid. Symptomatic treatment with plenty of fluids,rest,tylenol 500 mg 3-4 times per day prn. Throat lozenges if needed for sore throat. Continue Flonase and Astelin nasal spray for rhinorrhea, nasal saline irrigations as needed will also help. Monitor for new symptoms. 5 to 7 days of quarantine. Explained that cough and congestion may last a few more days and even weeks after acute symptoms have resolved. Clearly instructed about warning signs.   We discussed possible serious and likely etiologies, options for evaluation and workup, limitations of telemedicine visit vs in person visit, treatment, treatment risks and precautions. The patient was advised to call back or seek an in-person evaluation if the symptoms worsen or if the condition fails to improve as anticipated. I discussed the assessment and treatment plan with the patient. Ms Melinda Ferguson was provided an opportunity to ask questions and all were answered. She agreed with the plan and demonstrated an understanding of the instructions.  Return if symptoms worsen or fail to improve.  Betty G. Martinique, MD  [facility]. [place] office.

## 2021-09-03 ENCOUNTER — Other Ambulatory Visit: Payer: Self-pay | Admitting: Family Medicine

## 2021-09-07 MED ORDER — AZELASTINE HCL 0.15 % NA SOLN: NASAL | 5 refills | Status: DC

## 2021-09-07 MED ORDER — FLUTICASONE PROPIONATE 50 MCG/ACT NA SUSP: 2.0000 | Freq: Every day | NASAL | 5 refills | Status: DC

## 2021-09-09 ENCOUNTER — Encounter: Payer: Self-pay | Admitting: Family Medicine

## 2021-09-10 DIAGNOSIS — M1712 Unilateral primary osteoarthritis, left knee: Secondary | ICD-10-CM | POA: Diagnosis not present

## 2021-10-06 DIAGNOSIS — G8918 Other acute postprocedural pain: Secondary | ICD-10-CM | POA: Diagnosis not present

## 2021-10-06 DIAGNOSIS — G5601 Carpal tunnel syndrome, right upper limb: Secondary | ICD-10-CM | POA: Diagnosis not present

## 2021-10-15 DIAGNOSIS — M1712 Unilateral primary osteoarthritis, left knee: Secondary | ICD-10-CM | POA: Diagnosis not present

## 2021-10-20 ENCOUNTER — Telehealth: Payer: Self-pay | Admitting: Physical Medicine and Rehabilitation

## 2021-10-20 NOTE — Telephone Encounter (Signed)
Pt called requesting an neck injection appt. Please call pt to set appt. Phone number is [phone]. ?

## 2021-11-19 ENCOUNTER — Encounter: Payer: Self-pay | Admitting: Family Medicine

## 2021-11-21 ENCOUNTER — Other Ambulatory Visit: Payer: Self-pay | Admitting: Family Medicine

## 2021-11-21 MED ORDER — GABAPENTIN 100 MG PO CAPS: ORAL_CAPSULE | ORAL | 1 refills | Status: DC

## 2021-11-23 ENCOUNTER — Ambulatory Visit: Payer: Self-pay

## 2021-11-23 ENCOUNTER — Encounter: Payer: Self-pay | Admitting: Physical Medicine and Rehabilitation

## 2021-11-23 ENCOUNTER — Ambulatory Visit: Payer: Medicare PPO | Admitting: Physical Medicine and Rehabilitation

## 2021-11-23 VITALS — BP 143/90 | HR 62

## 2021-11-23 DIAGNOSIS — M5412 Radiculopathy, cervical region: Secondary | ICD-10-CM | POA: Diagnosis not present

## 2021-11-23 MED ORDER — METHYLPREDNISOLONE ACETATE 80 MG/ML IJ SUSP
80.0000 mg | Freq: Once | INTRAMUSCULAR | Status: AC
  Administered 2021-11-23: 80 mg

## 2021-11-23 NOTE — Progress Notes (Signed)
Numeric Pain Rating Scale and Functional Assessment Average Pain (4/10)   In the last MONTH (on 0-10 scale) has pain interfered with the following?  1. General activity like being  able to carry out your everyday physical activities such as walking, climbing stairs, carrying groceries, or moving a chair?  Rating(4)   Patient is here to repeat C-spine injection. Last injection helped 6-7 months. She is taking Gabapentin and Advil for pain.    +Driver, -BT, -Dye Allergies.

## 2021-12-13 NOTE — Procedures (Signed)
Cervical Epidural Steroid Injection - Interlaminar Approach with Fluoroscopic Guidance  Patient: Melinda Ferguson      Date of Birth: [birth year] MRN: [id number] PCP: [name], MD      Visit Date: 11/23/2021   Universal Protocol:    Date/Time: 05/28/231:10 PM  Consent Given By: the patient  Position: PRONE  Additional Comments: Vital signs were monitored before and after the procedure. Patient was prepped and draped in the usual sterile fashion. The correct patient, procedure, and site was verified.   Injection Procedure Details:   Procedure diagnoses: Cervical radiculopathy [M54.12]    Meds Administered:  Meds ordered this encounter  Medications   methylPREDNISolone acetate (DEPO-MEDROL) injection 80 mg     Laterality: Left  Location/Site: C7-T1  Needle: 3.5 in., 20 ga. Tuohy  Needle Placement: Paramedian epidural space  Findings:  -Comments: Excellent flow of contrast into the epidural space.  Procedure Details: Using a paramedian approach from the side mentioned above, the region overlying the inferior lamina was localized under fluoroscopic visualization and the soft tissues overlying this structure were infiltrated with 4 ml. of 1% Lidocaine without Epinephrine. A # 20 gauge, Tuohy needle was inserted into the epidural space using a paramedian approach.  The epidural space was localized using loss of resistance along with contralateral oblique bi-planar fluoroscopic views.  After negative aspirate for air, blood, and CSF, a 2 ml. volume of Isovue-250 was injected into the epidural space and the flow of contrast was observed. Radiographs were obtained for documentation purposes.   The injectate was administered into the level noted above.  Additional Comments:  The patient tolerated the procedure well Dressing: 2 x 2 sterile gauze and Band-Aid    Post-procedure details: Patient was observed during the procedure. Post-procedure instructions were  reviewed.  Patient left the clinic in stable condition.

## 2021-12-13 NOTE — Progress Notes (Signed)
Melinda Ferguson - 71 y.o. female MRN [id number]  Date of birth: February 07, 1951  Office Visit Note: Visit Date: 11/23/2021 PCP: [name], MD Referred by: [name], MD  Subjective: Chief Complaint  Patient presents with   Neck - Pain   HPI:  Melinda Ferguson is a 71 y.o. female who comes in today for planned repeat Left C7-T1  Cervical Interlaminar epidural steroid injection with fluoroscopic guidance.  The patient has failed conservative care including home exercise, medications, time and activity modification.  This injection will be diagnostic and hopefully therapeutic.  Please see requesting physician notes for further details and justification. Patient received more than 50% pain relief from prior injection.   Referring: Dr. Jean Rosenthal  ROS Otherwise per HPI.  Assessment & Plan: Visit Diagnoses:    ICD-10-CM   1. Cervical radiculopathy  M54.12 XR C-ARM NO REPORT    Epidural Steroid injection    methylPREDNISolone acetate (DEPO-MEDROL) injection 80 mg      Plan: No additional findings.   Meds & Orders:  Meds ordered this encounter  Medications   methylPREDNISolone acetate (DEPO-MEDROL) injection 80 mg    Orders Placed This Encounter  Procedures   XR C-ARM NO REPORT   Epidural Steroid injection    Follow-up: No follow-ups on file.   Procedures: No procedures performed  Cervical Epidural Steroid Injection - Interlaminar Approach with Fluoroscopic Guidance  Patient: Melinda Ferguson      Date of Birth: January 29, 1951 MRN: [id number] PCP: [name], MD      Visit Date: 11/23/2021   Universal Protocol:    Date/Time: 05/28/231:10 PM  Consent Given By: the patient  Position: PRONE  Additional Comments: Vital signs were monitored before and after the procedure. Patient was prepped and draped in the usual sterile fashion. The correct patient, procedure, and site was verified.   Injection Procedure Details:   Procedure  diagnoses: Cervical radiculopathy [M54.12]    Meds Administered:  Meds ordered this encounter  Medications   methylPREDNISolone acetate (DEPO-MEDROL) injection 80 mg     Laterality: Left  Location/Site: C7-T1  Needle: 3.5 in., 20 ga. Tuohy  Needle Placement: Paramedian epidural space  Findings:  -Comments: Excellent flow of contrast into the epidural space.  Procedure Details: Using a paramedian approach from the side mentioned above, the region overlying the inferior lamina was localized under fluoroscopic visualization and the soft tissues overlying this structure were infiltrated with 4 ml. of 1% Lidocaine without Epinephrine. A # 20 gauge, Tuohy needle was inserted into the epidural space using a paramedian approach.  The epidural space was localized using loss of resistance along with contralateral oblique bi-planar fluoroscopic views.  After negative aspirate for air, blood, and CSF, a 2 ml. volume of Isovue-250 was injected into the epidural space and the flow of contrast was observed. Radiographs were obtained for documentation purposes.   The injectate was administered into the level noted above.  Additional Comments:  The patient tolerated the procedure well Dressing: 2 x 2 sterile gauze and Band-Aid    Post-procedure details: Patient was observed during the procedure. Post-procedure instructions were reviewed.  Patient left the clinic in stable condition.   Clinical History: MRI CERVICAL SPINE WITHOUT CONTRAST     TECHNIQUE:  Multiplanar, multisequence MR imaging of the cervical spine was  performed. No intravenous contrast was administered.     COMPARISON:  None.     FINDINGS:  Alignment: Slight anterolisthesis at C7-T1, facet  mediated/degenerative  Vertebrae: No fracture, evidence of discitis, or bone lesion.     Cord: Normal signal and morphology.     Posterior Fossa, vertebral arteries, paraspinal tissues: Negative.     Disc levels:      C2-3: Degenerative facet spurring asymmetric to the left. Disc  narrowing and bulging. Mild left foraminal narrowing     C3-4: Disc narrowing and bulging with uncovertebral spurring. Mild  right foraminal narrowing     C4-5: Disc narrowing and bulging with circumferential ridge. No  neural compression     C5-6: Disc narrowing and bulging with circumferential ridging. Mild  left facet spurring. No neural compression     C6-7: Disc narrowing and bulging with small right foraminal  protrusion suggested on sagittal images. Negative facets. No neural  compression     C7-T1:Facet spurring mild anterolisthesis. Mild disc space  narrowing.     IMPRESSION:  Generalized cervical spine degeneration with mild C7-T1  anterolisthesis. No high-grade stenosis or herniation to explain  left shoulder symptoms.        Electronically Signed    By: Monte Fantasia M.D.    On: 01/28/2021 07:33     Objective:  VS:  HT:    WT:   BMI:     BP:(!) 143/90  HR:62bpm  TEMP: ( )  RESP:  Physical Exam Vitals and nursing note reviewed.  Constitutional:      General: She is not in acute distress.    Appearance: Normal appearance. She is not ill-appearing.  HENT:     Head: Normocephalic and atraumatic.     Right Ear: External ear normal.     Left Ear: External ear normal.  Eyes:     Extraocular Movements: Extraocular movements intact.  Cardiovascular:     Rate and Rhythm: Normal rate.     Pulses: Normal pulses.  Musculoskeletal:     Cervical back: Tenderness present. No rigidity.     Right lower leg: No edema.     Left lower leg: No edema.     Comments: Patient has good strength in the upper extremities including 5 out of 5 strength in wrist extension long finger flexion and APB.  There is no atrophy of the hands intrinsically.  There is a negative Hoffmann's test.   Lymphadenopathy:     Cervical: No cervical adenopathy.  Skin:    Findings: No erythema, lesion or rash.  Neurological:      General: No focal deficit present.     Mental Status: She is alert and oriented to person, place, and time.     Sensory: No sensory deficit.     Motor: No weakness or abnormal muscle tone.     Coordination: Coordination normal.  Psychiatric:        Mood and Affect: Mood normal.        Behavior: Behavior normal.     Imaging: No results found.

## 2022-01-12 ENCOUNTER — Telehealth: Payer: Self-pay | Admitting: Family Medicine

## 2022-01-12 DIAGNOSIS — M25562 Pain in left knee: Secondary | ICD-10-CM | POA: Diagnosis not present

## 2022-01-13 DIAGNOSIS — R6889 Other general symptoms and signs: Secondary | ICD-10-CM | POA: Diagnosis not present

## 2022-01-13 NOTE — Telephone Encounter (Signed)
Left a detailed message stating an appointment is needed as the form requires a general physical and labs within the past 6 weeks and she has not been seen and requested she call back to schedule a visit-message also left stating [name] does have openings this Friday at this time.

## 2022-01-15 ENCOUNTER — Encounter: Payer: Self-pay | Admitting: Family

## 2022-01-15 ENCOUNTER — Ambulatory Visit: Payer: Medicare PPO | Admitting: Family

## 2022-01-15 VITALS — BP 142/68 | HR 72 | Temp 97.5°F | Ht 63.5 in | Wt 204.0 lb

## 2022-01-15 DIAGNOSIS — M25562 Pain in left knee: Secondary | ICD-10-CM | POA: Diagnosis not present

## 2022-01-15 DIAGNOSIS — G8929 Other chronic pain: Secondary | ICD-10-CM

## 2022-01-15 DIAGNOSIS — I10 Essential (primary) hypertension: Secondary | ICD-10-CM

## 2022-01-17 NOTE — Progress Notes (Signed)
Acute Office Visit  Subjective:     Patient ID: Melinda Ferguson, female    DOB: 11-28-1950, 71 y.o.   MRN: [id number]  Chief Complaint  Patient presents with   Pre-op Exam    HPI Patient is in today for surgical clearance for left total knee replacement. She reports doing ok overall. Is getting nervous about the procedure but ready to have it done. She had labs done through orthopedics. Tolertaes blood pressure medication well. No concerns.  Review of Systems  Cardiovascular: Negative.   Musculoskeletal:  Positive for joint pain.       Left knee pain  All other systems reviewed and are negative.  Past Medical History:  Diagnosis Date   Allergy    Arthritis    Colon polyps    Hypertension     Social History   Socioeconomic History   Marital status: Married    Spouse name: Not on file   Number of children: Not on file   Years of education: Not on file   Highest education level: Not on file  Occupational History   Occupation: language facilitator    Comment: retired  Tobacco Use   Smoking status: Former    Types: Cigarettes    Quit date: 01/04/1979    Years since quitting: 43.0   Smokeless tobacco: Never  Vaping Use   Vaping Use: Never used  Substance and Sexual Activity   Alcohol use: Yes    Alcohol/week: 7.0 standard drinks of alcohol    Types: 7 Glasses of wine per week   Drug use: No   Sexual activity: Yes  Other Topics Concern   Not on file  Social History Narrative   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      Home Situation: lives with husband      Spiritual Beliefs: none      Lifestyle: walks about 1 hour daily; no diet plan            Social Determinants of Health   Financial Resource Strain: Low Risk  (05/26/2021)   Overall Financial Resource Strain (CARDIA)    Difficulty of Paying Living Expenses: Not hard at all  Food Insecurity: No Food Insecurity (05/26/2021)   Hunger Vital Sign    Worried About  Running Out of Food in the Last Year: Never true    Ran Out of Food in the Last Year: Never true  Transportation Needs: No Transportation Needs (05/26/2021)   PRAPARE - Hydrologist (Medical): No    Lack of Transportation (Non-Medical): No  Physical Activity: Insufficiently Active (05/26/2021)   Exercise Vital Sign    Days of Exercise per Week: 3 days    Minutes of Exercise per Session: 30 min  Stress: No Stress Concern Present (05/26/2021)   [place]    Feeling of Stress : Not at all  Social Connections: Moderately Integrated (05/20/2020)   Social Connection and Isolation Panel [NHANES]    Frequency of Communication with Friends and Family: More than three times a week    Frequency of Social Gatherings with Friends and Family: More than three times a week    Attends Religious Services: Never    Marine scientist or Organizations: Yes    Attends Archivist Meetings: 1 to 4 times per year    Marital Status: Married  Human resources officer Violence: Not At Risk (05/20/2020)  Humiliation, Afraid, Rape, and Kick questionnaire    Fear of Current or Ex-Partner: No    Emotionally Abused: No    Physically Abused: No    Sexually Abused: No    Past Surgical History:  Procedure Laterality Date   COLONOSCOPY     orthoscopy Left 1994   KNEE   TONSILLECTOMY     wisdom teeth extraction  1970    Family History  Problem Relation Age of Onset   Alzheimer's disease Mother 32       passed at 61   Diverticulosis Mother    Heart disease Father 91       CAD, bypass   Colon cancer Maternal Grandfather 46   Migraines Sister    Esophageal cancer Neg Hx    Stomach cancer Neg Hx    Rectal cancer Neg Hx     No Known Allergies  Current Outpatient Medications on File Prior to Visit  Medication Sig Dispense Refill   Azelastine HCl 0.15 % SOLN 2 actuations in each nostril BID 30 mL 5   BIOTIN  PO Take 1 tablet by mouth daily.     celecoxib (CELEBREX) 200 MG capsule Take 1 capsule (200 mg total) by mouth 2 (two) times daily as needed. 60 capsule 0   Cholecalciferol (VITAMIN D PO) Take 1,000 Units by mouth daily.     cyclobenzaprine (FLEXERIL) 10 MG tablet Take 1 tablet (10 mg total) by mouth 3 (three) times daily as needed for muscle spasms. 40 tablet 1   diphenhydramine-acetaminophen (TYLENOL PM) 25-500 MG TABS tablet Take 1 tablet by mouth at bedtime.     Emollient (COLLAGEN EX) Apply topically.     fexofenadine (ALLEGRA) 60 MG tablet Take 60 mg by mouth daily.     fluticasone (FLONASE) 50 MCG/ACT nasal spray Place 2 sprays into both nostrils daily. 16 g 5   gabapentin (NEURONTIN) 100 MG capsule TAKE 1-3 CAPSULES AT BEDTIME 270 capsule 1   losartan-hydrochlorothiazide (HYZAAR) 100-12.5 MG tablet Take 1 tablet by mouth daily. 90 tablet 0   LUTEIN PO Take 1 tablet by mouth daily.     Misc Natural Products (RESVERATROL DIET PO)      psyllium (METAMUCIL) 58.6 % powder Take 1 packet by mouth daily.     Pyridoxine HCl (VITAMIN B-6 PO) Take 1 tablet by mouth daily.     Turmeric 500 MG CAPS Take by mouth.     vitamin E 1000 UNIT capsule Take 1,000 Units by mouth daily.     No current facility-administered medications on file prior to visit.    BP (!) 142/68 (BP Location: Left Arm, Patient Position: Sitting, Cuff Size: Large)   Pulse 72   Temp (!) 97.5 F (36.4 C) (Oral)   Ht 5' 3.5" (1.613 m)   Wt 204 lb (92.5 kg)   LMP 07/19/1997   SpO2 98%   BMI 35.57 kg/m chart      Objective:    BP (!) 142/68 (BP Location: Left Arm, Patient Position: Sitting, Cuff Size: Large)   Pulse 72   Temp (!) 97.5 F (36.4 C) (Oral)   Ht 5' 3.5" (1.613 m)   Wt 204 lb (92.5 kg)   LMP 07/19/1997   SpO2 98%   BMI 35.57 kg/m  BP Readings from Last 3 Encounters:  01/15/22 (!) 142/68  11/23/21 (!) 143/90  07/27/21 110/64      Physical Exam Vitals and nursing note reviewed.  Constitutional:       Appearance: Normal  appearance. She is normal weight.  HENT:     Mouth/Throat:     Mouth: Mucous membranes are moist.  Cardiovascular:     Rate and Rhythm: Normal rate and regular rhythm.  Pulmonary:     Effort: Pulmonary effort is normal.     Breath sounds: Normal breath sounds.  Abdominal:     General: Abdomen is flat.     Palpations: Abdomen is soft.  Musculoskeletal:        General: Normal range of motion.     Cervical back: Normal range of motion and neck supple.  Skin:    General: Skin is warm and dry.  Neurological:     General: No focal deficit present.     Mental Status: She is alert and oriented to person, place, and time.  Psychiatric:        Mood and Affect: Mood normal.        Behavior: Behavior normal.     No results found for any visits on 01/15/22.      Assessment & Plan:   Problem List Items Addressed This Visit     HTN (hypertension)   Other Visit Diagnoses     Chronic pain of left knee    -  Primary     Call the office if symptoms worsen or persist. Recheck as scheduled and sooner as needed.   No orders of the defined types were placed in this encounter.   No follow-ups on file.  Kennyth Arnold, FNP

## 2022-01-22 DIAGNOSIS — H5203 Hypermetropia, bilateral: Secondary | ICD-10-CM | POA: Diagnosis not present

## 2022-01-22 DIAGNOSIS — H25813 Combined forms of age-related cataract, bilateral: Secondary | ICD-10-CM | POA: Diagnosis not present

## 2022-01-25 ENCOUNTER — Telehealth: Payer: Self-pay | Admitting: Family Medicine

## 2022-01-25 NOTE — Telephone Encounter (Signed)
Pt needs refills of Losartan called in to pharmacy due to no refills left--Pt is having total knee replacement on July 25th and won't be able to come into the office for a bit.  Patient has recently seen Dutch Quint.    Pharmacy--[facility]

## 2022-01-26 ENCOUNTER — Other Ambulatory Visit: Payer: Self-pay | Admitting: Family

## 2022-01-26 MED ORDER — LOSARTAN POTASSIUM-HCTZ 100-12.5 MG PO TABS: 1.0000 | ORAL_TABLET | Freq: Every day | ORAL | 0 refills | Status: DC

## 2022-02-09 DIAGNOSIS — M1712 Unilateral primary osteoarthritis, left knee: Secondary | ICD-10-CM | POA: Diagnosis not present

## 2022-02-09 DIAGNOSIS — G8918 Other acute postprocedural pain: Secondary | ICD-10-CM | POA: Diagnosis not present

## 2022-02-11 DIAGNOSIS — M25562 Pain in left knee: Secondary | ICD-10-CM | POA: Diagnosis not present

## 2022-02-11 DIAGNOSIS — M25662 Stiffness of left knee, not elsewhere classified: Secondary | ICD-10-CM | POA: Diagnosis not present

## 2022-02-15 DIAGNOSIS — M25562 Pain in left knee: Secondary | ICD-10-CM | POA: Diagnosis not present

## 2022-02-15 DIAGNOSIS — M25662 Stiffness of left knee, not elsewhere classified: Secondary | ICD-10-CM | POA: Diagnosis not present

## 2022-02-18 DIAGNOSIS — M25562 Pain in left knee: Secondary | ICD-10-CM | POA: Diagnosis not present

## 2022-02-18 DIAGNOSIS — M25662 Stiffness of left knee, not elsewhere classified: Secondary | ICD-10-CM | POA: Diagnosis not present

## 2022-02-22 DIAGNOSIS — M25562 Pain in left knee: Secondary | ICD-10-CM | POA: Diagnosis not present

## 2022-02-22 DIAGNOSIS — M25662 Stiffness of left knee, not elsewhere classified: Secondary | ICD-10-CM | POA: Diagnosis not present

## 2022-02-25 DIAGNOSIS — M25662 Stiffness of left knee, not elsewhere classified: Secondary | ICD-10-CM | POA: Diagnosis not present

## 2022-02-25 DIAGNOSIS — M25562 Pain in left knee: Secondary | ICD-10-CM | POA: Diagnosis not present

## 2022-03-01 DIAGNOSIS — M25562 Pain in left knee: Secondary | ICD-10-CM | POA: Diagnosis not present

## 2022-03-01 DIAGNOSIS — M25662 Stiffness of left knee, not elsewhere classified: Secondary | ICD-10-CM | POA: Diagnosis not present

## 2022-03-04 DIAGNOSIS — Z5189 Encounter for other specified aftercare: Secondary | ICD-10-CM | POA: Diagnosis not present

## 2022-03-04 DIAGNOSIS — M25562 Pain in left knee: Secondary | ICD-10-CM | POA: Diagnosis not present

## 2022-03-04 DIAGNOSIS — M25662 Stiffness of left knee, not elsewhere classified: Secondary | ICD-10-CM | POA: Diagnosis not present

## 2022-03-08 DIAGNOSIS — M25562 Pain in left knee: Secondary | ICD-10-CM | POA: Diagnosis not present

## 2022-03-08 DIAGNOSIS — M25662 Stiffness of left knee, not elsewhere classified: Secondary | ICD-10-CM | POA: Diagnosis not present

## 2022-03-11 DIAGNOSIS — M25562 Pain in left knee: Secondary | ICD-10-CM | POA: Diagnosis not present

## 2022-03-11 DIAGNOSIS — M25662 Stiffness of left knee, not elsewhere classified: Secondary | ICD-10-CM | POA: Diagnosis not present

## 2022-03-15 DIAGNOSIS — M25562 Pain in left knee: Secondary | ICD-10-CM | POA: Diagnosis not present

## 2022-03-15 DIAGNOSIS — M25662 Stiffness of left knee, not elsewhere classified: Secondary | ICD-10-CM | POA: Diagnosis not present

## 2022-03-16 DIAGNOSIS — Z5189 Encounter for other specified aftercare: Secondary | ICD-10-CM | POA: Diagnosis not present

## 2022-03-18 DIAGNOSIS — M25662 Stiffness of left knee, not elsewhere classified: Secondary | ICD-10-CM | POA: Diagnosis not present

## 2022-03-18 DIAGNOSIS — M25562 Pain in left knee: Secondary | ICD-10-CM | POA: Diagnosis not present

## 2022-04-19 ENCOUNTER — Ambulatory Visit: Payer: Medicare PPO | Admitting: Family Medicine

## 2022-04-19 ENCOUNTER — Encounter: Payer: Self-pay | Admitting: Family Medicine

## 2022-04-19 VITALS — BP 118/60 | HR 80 | Temp 97.8°F | Ht 63.5 in | Wt 180.1 lb

## 2022-04-19 DIAGNOSIS — I10 Essential (primary) hypertension: Secondary | ICD-10-CM | POA: Diagnosis not present

## 2022-04-19 DIAGNOSIS — M47816 Spondylosis without myelopathy or radiculopathy, lumbar region: Secondary | ICD-10-CM

## 2022-04-19 MED ORDER — LOSARTAN POTASSIUM-HCTZ 100-12.5 MG PO TABS: 1.0000 | ORAL_TABLET | Freq: Every day | ORAL | 3 refills | Status: DC

## 2022-04-19 NOTE — Assessment & Plan Note (Signed)
Intermittent, chronic in nature. I reviewed her X-rays with her and we discussed conservative measures for treatment inculding PT, NSAIDS, rest and heat or ice. Pt had injections in her cervical spine that did help her with the patient. She also takes gabapentin 200 mg at bedtime for her cervical neck issues, she denies any radiculopathy at this time. Sx seem to be under control at this time. Pt is to let us know if anything changes.

## 2022-04-19 NOTE — Assessment & Plan Note (Addendum)
Current hypertension medications:      Sig   losartan-hydrochlorothiazide (HYZAAR) 100-12.5 MG tablet Take 1 tablet by mouth daily.     BP performed in office and is WNL, will continue the above medications as prescribed. We did discuss the elevated cholesterol and her elevated CVD risk, pt states she is trying to lose weight, states that she does not wish to start cholesterol medication at this time. Will see her back in February for follow up and her annual labs.

## 2022-04-19 NOTE — Progress Notes (Signed)
Established Patient Office Visit  Subjective   Patient ID: Melinda Ferguson, female    DOB: [birth year]  Age: 71 y.o. MRN: [id number]  Chief Complaint  Patient presents with   Establish Care    Patient is here for transition of care visit. Patient reports she had a knee replacement in July 2023. She reports she is recovering well, states that she is continuing her therapy even though she has met her goals after her surgery. She reports a history of recurrent back pain occasionally. We reviewed her lumbar films she had done last year. She reports she had 2 steroid injections in her neck which did help. She is currently taking gabapentin 2 capsules every night, states that this was because of upper extremity spasming that she was experiencing a few years ago.   HTN -- BP in office performed and is well controlled. She  reports no side effects to the medications, no chest pain, SOB, dizziness or headaches. She has a BP cuff at home and is checking BP regularly, reports they are in the normal range.       Current Outpatient Medications  Medication Instructions   Azelastine HCl 0.15 % SOLN 2 actuations in each nostril BID   BIOTIN PO 1 tablet, Oral, Daily   diphenhydramine-acetaminophen (TYLENOL PM) 25-500 MG TABS tablet 1 tablet, Oral, Daily at bedtime   Emollient (COLLAGEN EX) Apply externally   fexofenadine (ALLEGRA) 60 mg, Oral, Daily   fluticasone (FLONASE) 50 MCG/ACT nasal spray 2 sprays, Each Nare, Daily   gabapentin (NEURONTIN) 100 MG capsule TAKE 1-3 CAPSULES AT BEDTIME   losartan-hydrochlorothiazide (HYZAAR) 100-12.5 MG tablet 1 tablet, Oral, Daily   LUTEIN PO 1 tablet, Oral, Daily   Misc Natural Products (RESVERATROL DIET PO) No dose, route, or frequency recorded.   psyllium (METAMUCIL) 58.6 % powder 1 packet, Oral, Daily   vitamin E 1,000 Units, Oral, Daily     Patient Active Problem List   Diagnosis Date Noted   Lumbar spondylosis 04/19/2022   Osteoarthritis of right  knee - sees Dr. Wynelle Link 02/19/2014   HTN (hypertension) 01/01/2013   Allergic rhinitis 01/01/2013      Review of Systems  All other systems reviewed and are negative.     Objective:     BP 118/60 (BP Location: Left Arm, Patient Position: Sitting, Cuff Size: Large)   Pulse 80   Temp 97.8 F (36.6 C) (Oral)   Ht 5' 3.5" (1.613 m)   Wt 180 lb 1.6 oz (81.7 kg)   LMP 07/19/1997   SpO2 98%   BMI 31.40 kg/m  BP Readings from Last 3 Encounters:  04/19/22 118/60  01/15/22 (!) 142/68  11/23/21 (!) 143/90      Physical Exam Vitals reviewed.  Constitutional:      Appearance: Normal appearance. She is well-groomed and overweight.  HENT:     Head: Normocephalic and atraumatic.  Eyes:     Conjunctiva/sclera: Conjunctivae normal.  Neck:     Thyroid: No thyromegaly.  Cardiovascular:     Rate and Rhythm: Normal rate and regular rhythm.     Pulses: Normal pulses.     Heart sounds: S1 normal and S2 normal.  Pulmonary:     Effort: Pulmonary effort is normal.     Breath sounds: Normal breath sounds and air entry.  Abdominal:     General: Bowel sounds are normal.  Musculoskeletal:        General: Normal range of motion.     Cervical back:  Normal range of motion.     Right lower leg: Edema (trace to the mid calf) present.     Left lower leg: Edema (trace to the mid calf) present.  Skin:    General: Skin is warm and dry.  Neurological:     Mental Status: She is alert and oriented to person, place, and time. Mental status is at baseline.     Gait: Gait is intact.  Psychiatric:        Mood and Affect: Mood and affect normal.        Speech: Speech normal.        Behavior: Behavior normal.        Judgment: Judgment normal.      No results found for any visits on 04/19/22.  Last metabolic panel Lab Results  Component Value Date   GLUCOSE 117 (H) 07/27/2021   NA 136 07/27/2021   K 3.6 07/27/2021   CL 98 07/27/2021   CO2 28 07/27/2021   BUN 17 07/27/2021   CREATININE  0.83 07/27/2021   GFRNONAA >60 05/26/2018   CALCIUM 9.4 07/27/2021   PROT 7.4 07/27/2021   ALBUMIN 4.4 07/27/2021   BILITOT 0.8 07/27/2021   ALKPHOS 67 07/27/2021   AST 17 07/27/2021   ALT 14 07/27/2021   ANIONGAP 7 05/26/2018   Last lipids Lab Results  Component Value Date   CHOL 255 (H) 07/27/2021   HDL 76.60 07/27/2021   LDLCALC 166 (H) 07/27/2021   LDLDIRECT 154.1 01/04/2012   TRIG 63.0 07/27/2021   CHOLHDL 3 07/27/2021      The 10-year ASCVD risk score (Arnett DK, et al., 2019) is: 13.5%    Assessment & Plan:   Problem List Items Addressed This Visit       Cardiovascular and Mediastinum   HTN (hypertension) - Primary    Current hypertension medications:       Sig   losartan-hydrochlorothiazide (HYZAAR) 100-12.5 MG tablet Take 1 tablet by mouth daily.  BP performed in office and is WNL, will continue the above medications as prescribed. We did discuss the elevated cholesterol and her elevated CVD risk, pt states she is trying to lose weight, states that she does not wish to start cholesterol medication at this time. Will see her back in February for follow up and her annual labs.      Relevant Medications   losartan-hydrochlorothiazide (HYZAAR) 100-12.5 MG tablet     Musculoskeletal and Integument   Lumbar spondylosis (Chronic)    Intermittent, chronic in nature. I reviewed her X-rays with her and we discussed conservative measures for treatment inculding PT, NSAIDS, rest and heat or ice. Pt had injections in her cervical spine that did help her with the patient. She also takes gabapentin 200 mg at bedtime for her cervical neck issues, she denies any radiculopathy at this time. Sx seem to be under control at this time. Pt is to let us know if anything changes.       Return in about 4 months (around 08/19/2022) for Annual Physical.    Farrel Conners, MD

## 2022-04-26 DIAGNOSIS — M25662 Stiffness of left knee, not elsewhere classified: Secondary | ICD-10-CM | POA: Diagnosis not present

## 2022-04-26 DIAGNOSIS — M25562 Pain in left knee: Secondary | ICD-10-CM | POA: Diagnosis not present

## 2022-04-28 DIAGNOSIS — M25562 Pain in left knee: Secondary | ICD-10-CM | POA: Diagnosis not present

## 2022-04-28 DIAGNOSIS — M25662 Stiffness of left knee, not elsewhere classified: Secondary | ICD-10-CM | POA: Diagnosis not present

## 2022-05-03 ENCOUNTER — Ambulatory Visit: Payer: Medicare PPO | Admitting: Family

## 2022-05-03 ENCOUNTER — Encounter: Payer: Self-pay | Admitting: Family

## 2022-05-03 VITALS — BP 139/82 | HR 91 | Temp 98.0°F | Ht 63.5 in | Wt 181.0 lb

## 2022-05-03 DIAGNOSIS — R42 Dizziness and giddiness: Secondary | ICD-10-CM | POA: Diagnosis not present

## 2022-05-03 MED ORDER — MECLIZINE HCL 12.5 MG PO TABS: 12.5000 mg | ORAL_TABLET | Freq: Three times a day (TID) | ORAL | 0 refills | Status: AC | PRN

## 2022-05-03 MED ORDER — ONDANSETRON HCL 4 MG PO TABS: 4.0000 mg | ORAL_TABLET | Freq: Three times a day (TID) | ORAL | 0 refills | Status: DC | PRN

## 2022-05-03 NOTE — Progress Notes (Signed)
Patient ID: Melinda Ferguson, female    DOB: 11-07-1950, 71 y.o.   MRN: [id number]  Chief Complaint  Patient presents with   Dizziness    Sudden dizziness, Pt states when turns to her right side in bed she feels a head swimming feeling. Has tried exercises which did help but dizziness did not go away completely.     HPI:      Dizziness:  started a few nights ago, while turning in bed she felt dizzy, yesterday she was fine, but then woke up today with same sx again along w/nausea & also reports balance issues.  She did try a maneuver at home (not [place])  that her sister showed her as she has had the same sx in past, which she thinks may have helped. She denies any recent illness or more stress than usual. States she does think her steroid injection given in May for neck pain is wearing off.      Assessment & Plan:  1. Vertigo sending low dose Meclizine & Zofran, advised on use & SE. Advised on hydration and eating mild diet rest of today. Referring to PT.  - meclizine (ANTIVERT) 12.5 MG tablet; Take 1 tablet (12.5 mg total) by mouth 3 (three) times daily as needed for dizziness.  Dispense: 30 tablet; Refill: 0 - ondansetron (ZOFRAN) 4 MG tablet; Take 1 tablet (4 mg total) by mouth every 8 (eight) hours as needed for nausea or vomiting.  Dispense: 20 tablet; Refill: 0 - Ambulatory referral to Physical Therapy  Subjective:    Outpatient Medications Prior to Visit  Medication Sig Dispense Refill   Azelastine HCl 0.15 % SOLN 2 actuations in each nostril BID 30 mL 5   BIOTIN PO Take 1 tablet by mouth daily.     diphenhydramine-acetaminophen (TYLENOL PM) 25-500 MG TABS tablet Take 1 tablet by mouth at bedtime.     Emollient (COLLAGEN EX) Apply topically.     fexofenadine (ALLEGRA) 60 MG tablet Take 60 mg by mouth daily.     fluticasone (FLONASE) 50 MCG/ACT nasal spray Place 2 sprays into both nostrils daily. 16 g 5   gabapentin (NEURONTIN) 100 MG capsule TAKE 1-3 CAPSULES AT BEDTIME 270  capsule 1   losartan-hydrochlorothiazide (HYZAAR) 100-12.5 MG tablet Take 1 tablet by mouth daily. 90 tablet 3   LUTEIN PO Take 1 tablet by mouth daily.     Misc Natural Products (RESVERATROL DIET PO)      psyllium (METAMUCIL) 58.6 % powder Take 1 packet by mouth daily.     vitamin E 1000 UNIT capsule Take 1,000 Units by mouth daily.     No facility-administered medications prior to visit.   Past Medical History:  Diagnosis Date   Allergy    Arthritis    Colon polyps    Hypertension    Past Surgical History:  Procedure Laterality Date   COLONOSCOPY     orthoscopy Left 1994   KNEE   TONSILLECTOMY     wisdom teeth extraction  1970   No Known Allergies    Objective:    Physical Exam Vitals and nursing note reviewed.  Constitutional:      Appearance: Normal appearance.  Cardiovascular:     Rate and Rhythm: Normal rate and regular rhythm.  Pulmonary:     Effort: Pulmonary effort is normal.     Breath sounds: Normal breath sounds.  Musculoskeletal:        General: Normal range of motion.  Skin:  General: Skin is warm and dry.  Neurological:     Mental Status: She is alert.  Psychiatric:        Mood and Affect: Mood normal.        Behavior: Behavior normal.    BP 139/82 (BP Location: Left Arm, Patient Position: Sitting, Cuff Size: Large)   Pulse 91   Temp 98 F (36.7 C) (Temporal)   Ht 5' 3.5" (1.613 m)   Wt 181 lb (82.1 kg)   LMP 07/19/1997   SpO2 96%   BMI 31.56 kg/m  Wt Readings from Last 3 Encounters:  05/03/22 181 lb (82.1 kg)  04/19/22 180 lb 1.6 oz (81.7 kg)  01/15/22 204 lb (92.5 kg)       Jeanie Sewer, NP

## 2022-05-03 NOTE — Patient Instructions (Signed)
It was very nice to see you today!   I have sent Meclizine to help your Vertigo symptoms. I recommend going ahead and taking 1 pill this evening and then another tomorrow morning to help prevent any symptoms, and then take as needed. I also refilled your nausea medication, generic Zofran. Hydrate well, and eat a mild diet the rest of today.     PLEASE NOTE:  If you had any lab tests please let us know if you have not heard back within a few days. You may see your results on MyChart before we have a chance to review them but we will give you a call once they are reviewed by [place]. If we ordered any referrals today, please let us know if you have not heard from their office within the next week.

## 2022-05-31 ENCOUNTER — Ambulatory Visit: Payer: Medicare PPO

## 2022-06-02 ENCOUNTER — Ambulatory Visit (INDEPENDENT_AMBULATORY_CARE_PROVIDER_SITE_OTHER): Payer: Medicare PPO

## 2022-06-02 VITALS — Ht 63.5 in | Wt 181.0 lb

## 2022-06-02 DIAGNOSIS — Z0000 Encounter for general adult medical examination without abnormal findings: Secondary | ICD-10-CM

## 2022-06-02 NOTE — Patient Instructions (Addendum)
Melinda Ferguson , Thank you for taking time to come for your Medicare Wellness Visit. I appreciate your ongoing commitment to your health goals. Please review the following plan we discussed and let me know if I can assist you in the future.   These are the goals we discussed:  Goals       Patient Stated (pt-stated)      Continue to lose weight.      Patient Stated      05/26/2021, lose weight wants to weigh 160 pounds        This is a list of the screening recommended for you and due dates:  Health Maintenance  Topic Date Due   COVID-19 Vaccine (5 - Moderna risk series) 06/18/2022*   Flu Shot  10/18/2022*   Colon Cancer Screening  01/17/2023*   Medicare Annual Wellness Visit  06/03/2023   Mammogram  07/29/2023   Tetanus Vaccine  05/20/2031   Pneumonia Vaccine  Completed   DEXA scan (bone density measurement)  Completed   Hepatitis C Screening: USPSTF Recommendation to screen - Ages 56-79 yo.  Completed   Zoster (Shingles) Vaccine  Completed   HPV Vaccine  Aged Out  *Topic was postponed. The date shown is not the original due date.    Advanced directives: Please bring a copy of your health care power of attorney and living will to the office to be added to your chart at your convenience.   Conditions/risks identified: None  Next appointment: Follow up in one year for your annual wellness visit     Preventive Care 65 Years and Older, Female Preventive care refers to lifestyle choices and visits with your health care provider that can promote health and wellness. What does preventive care include? A yearly physical exam. This is also called an annual well check. Dental exams once or twice a year. Routine eye exams. Ask your health care provider how often you should have your eyes checked. Personal lifestyle choices, including: Daily care of your teeth and gums. Regular physical activity. Eating a healthy diet. Avoiding tobacco and drug use. Limiting alcohol use. Practicing  safe sex. Taking low-dose aspirin every day. Taking vitamin and mineral supplements as recommended by your health care provider. What happens during an annual well check? The services and screenings done by your health care provider during your annual well check will depend on your age, overall health, lifestyle risk factors, and family history of disease. Counseling  Your health care provider may ask you questions about your: Alcohol use. Tobacco use. Drug use. Emotional well-being. Home and relationship well-being. Sexual activity. Eating habits. History of falls. Memory and ability to understand (cognition). Work and work Statistician. Reproductive health. Screening  You may have the following tests or measurements: Height, weight, and BMI. Blood pressure. Lipid and cholesterol levels. These may be checked every 5 years, or more frequently if you are over 49 years old. Skin check. Lung cancer screening. You may have this screening every year starting at age 6 if you have a 30-pack-year history of smoking and currently smoke or have quit within the past 15 years. Fecal occult blood test (FOBT) of the stool. You may have this test every year starting at age 54. Flexible sigmoidoscopy or colonoscopy. You may have a sigmoidoscopy every 5 years or a colonoscopy every 10 years starting at age 48. Hepatitis C blood test. Hepatitis B blood test. Sexually transmitted disease (STD) testing. Diabetes screening. This is done by checking your blood sugar (glucose)  after you have not eaten for a while (fasting). You may have this done every 1-3 years. Bone density scan. This is done to screen for osteoporosis. You may have this done starting at age 77. Mammogram. This may be done every 1-2 years. Talk to your health care provider about how often you should have regular mammograms. Talk with your health care provider about your test results, treatment options, and if necessary, the need for more  tests. Vaccines  Your health care provider may recommend certain vaccines, such as: Influenza vaccine. This is recommended every year. Tetanus, diphtheria, and acellular pertussis (Tdap, Td) vaccine. You may need a Td booster every 10 years. Zoster vaccine. You may need this after age 45. Pneumococcal 13-valent conjugate (PCV13) vaccine. One dose is recommended after age 32. Pneumococcal polysaccharide (PPSV23) vaccine. One dose is recommended after age 37. Talk to your health care provider about which screenings and vaccines you need and how often you need them. This information is not intended to replace advice given to you by your health care provider. Make sure you discuss any questions you have with your health care provider. Document Released: 08/01/2015 Document Revised: 03/24/2016 Document Reviewed: 05/06/2015 Elsevier Interactive Patient Education  2017 [employer] [employer] Prevention in the Home Falls can cause injuries. They can happen to people of all ages. There are many things you can do to make your home safe and to help prevent falls. What can I do on the outside of my home? Regularly fix the edges of walkways and driveways and fix any cracks. Remove anything that might make you trip as you walk through a door, such as a raised step or threshold. Trim any bushes or trees on the path to your home. Use bright outdoor lighting. Clear any walking paths of anything that might make someone trip, such as rocks or tools. Regularly check to see if handrails are loose or broken. Make sure that both sides of any steps have handrails. Any raised decks and porches should have guardrails on the edges. Have any leaves, snow, or ice cleared regularly. Use sand or salt on walking paths during winter. Clean up any spills in your garage right away. This includes oil or grease spills. What can I do in the bathroom? Use night lights. Install grab bars by the toilet and in the tub and shower.  Do not use towel bars as grab bars. Use non-skid mats or decals in the tub or shower. If you need to sit down in the shower, use a plastic, non-slip stool. Keep the floor dry. Clean up any water that spills on the floor as soon as it happens. Remove soap buildup in the tub or shower regularly. Attach bath mats securely with double-sided non-slip rug tape. Do not have throw rugs and other things on the floor that can make you trip. What can I do in the bedroom? Use night lights. Make sure that you have a light by your bed that is easy to reach. Do not use any sheets or blankets that are too big for your bed. They should not hang down onto the floor. Have a firm chair that has side arms. You can use this for support while you get dressed. Do not have throw rugs and other things on the floor that can make you trip. What can I do in the kitchen? Clean up any spills right away. Avoid walking on wet floors. Keep items that you use a lot in easy-to-reach places. If  you need to reach something above you, use a strong step stool that has a grab bar. Keep electrical cords out of the way. Do not use floor polish or wax that makes floors slippery. If you must use wax, use non-skid floor wax. Do not have throw rugs and other things on the floor that can make you trip. What can I do with my stairs? Do not leave any items on the stairs. Make sure that there are handrails on both sides of the stairs and use them. Fix handrails that are broken or loose. Make sure that handrails are as long as the stairways. Check any carpeting to make sure that it is firmly attached to the stairs. Fix any carpet that is loose or worn. Avoid having throw rugs at the top or bottom of the stairs. If you do have throw rugs, attach them to the floor with carpet tape. Make sure that you have a light switch at the top of the stairs and the bottom of the stairs. If you do not have them, ask someone to add them for you. What else  can I do to help prevent falls? Wear shoes that: Do not have high heels. Have rubber bottoms. Are comfortable and fit you well. Are closed at the toe. Do not wear sandals. If you use a stepladder: Make sure that it is fully opened. Do not climb a closed stepladder. Make sure that both sides of the stepladder are locked into place. Ask someone to hold it for you, if possible. Clearly mark and make sure that you can see: Any grab bars or handrails. First and last steps. Where the edge of each step is. Use tools that help you move around (mobility aids) if they are needed. These include: Canes. Walkers. Scooters. Crutches. Turn on the lights when you go into a dark area. Replace any light bulbs as soon as they burn out. Set up your furniture so you have a clear path. Avoid moving your furniture around. If any of your floors are uneven, fix them. If there are any pets around you, be aware of where they are. Review your medicines with your doctor. Some medicines can make you feel dizzy. This can increase your chance of falling. Ask your doctor what other things that you can do to help prevent falls. This information is not intended to replace advice given to you by your health care provider. Make sure you discuss any questions you have with your health care provider. Document Released: 05/01/2009 Document Revised: 12/11/2015 Document Reviewed: 08/09/2014 Elsevier Interactive Patient Education  2017 [employer].

## 2022-06-02 NOTE — Progress Notes (Signed)
Subjective:   Melinda Ferguson is a 71 y.o. female who presents for Medicare Annual (Subsequent) preventive examination.  Review of Systems    Virtual Visit via Telephone Note  I connected with  Fuller [place] on 06/02/22 at  8:15 AM EST by telephone and verified that I am speaking with the correct person using two identifiers.  Location: Patient: Home Provider: Office Persons participating in the virtual visit: patient/Nurse Health Advisor   I discussed the limitations, risks, security and privacy concerns of performing an evaluation and management service by telephone and the availability of in person appointments. The patient expressed understanding and agreed to proceed.  Interactive audio and video telecommunications were attempted between this nurse and patient, however failed, due to patient having technical difficulties OR patient did not have access to video capability.  We continued and completed visit with audio only.  Some vital signs may be absent or patient reported.   Criselda Peaches, LPN  Cardiac Risk Factors include: advanced age (>35mn, >>6women);hypertension     Objective:    Today's Vitals   06/02/22 0819 06/02/22 0820  Weight: 181 lb (82.1 kg)   Height: 5' 3.5" (1.613 m)   PainSc:  0-No pain   Body mass index is 31.56 kg/m.     06/02/2022    8:27 AM 05/26/2021    3:03 PM 10/15/2020    9:32 AM 05/20/2020    8:11 AM 02/11/2016    1:08 PM 01/26/2016    1:42 PM  Advanced Directives  Does Patient Have a Medical Advance Directive? Yes Yes Yes Yes Yes Yes  Type of AParamedicof ALa FayetteLiving will HFurnace CreekLiving will  HGardnerLiving will HHasbrouck HeightsLiving will HArgonnein Chart? No - copy requested No - copy requested  No - copy requested      Current Medications (verified) Outpatient Encounter Medications  as of 06/02/2022  Medication Sig   Azelastine HCl 0.15 % SOLN 2 actuations in each nostril BID   BIOTIN PO Take 1 tablet by mouth daily.   diphenhydramine-acetaminophen (TYLENOL PM) 25-500 MG TABS tablet Take 1 tablet by mouth at bedtime.   Emollient (COLLAGEN EX) Apply topically.   fexofenadine (ALLEGRA) 60 MG tablet Take 60 mg by mouth daily.   fluticasone (FLONASE) 50 MCG/ACT nasal spray Place 2 sprays into both nostrils daily.   gabapentin (NEURONTIN) 100 MG capsule TAKE 1-3 CAPSULES AT BEDTIME   losartan-hydrochlorothiazide (HYZAAR) 100-12.5 MG tablet Take 1 tablet by mouth daily.   LUTEIN PO Take 1 tablet by mouth daily.   meclizine (ANTIVERT) 12.5 MG tablet Take 1 tablet (12.5 mg total) by mouth 3 (three) times daily as needed for dizziness.   Misc Natural Products (RESVERATROL DIET PO)    ondansetron (ZOFRAN) 4 MG tablet Take 1 tablet (4 mg total) by mouth every 8 (eight) hours as needed for nausea or vomiting.   psyllium (METAMUCIL) 58.6 % powder Take 1 packet by mouth daily.   vitamin E 1000 UNIT capsule Take 1,000 Units by mouth daily.   No facility-administered encounter medications on file as of 06/02/2022.    Allergies (verified) Patient has no known allergies.   History: Past Medical History:  Diagnosis Date   Allergy    Arthritis    Colon polyps    Hypertension    Past Surgical History:  Procedure Laterality Date   COLONOSCOPY  orthoscopy Left 1994   KNEE   TONSILLECTOMY     wisdom teeth extraction  1970   Family History  Problem Relation Age of Onset   Alzheimer's disease Mother 25       passed at 23   Diverticulosis Mother    Heart disease Father 47       CAD, bypass   Colon cancer Maternal Grandfather 20   Migraines Sister    Esophageal cancer Neg Hx    Stomach cancer Neg Hx    Rectal cancer Neg Hx    Social History   Socioeconomic History   Marital status: Married    Spouse name: Not on file   Number of children: Not on file   Years of  education: Not on file   Highest education level: Not on file  Occupational History   Occupation: language facilitator    Comment: retired  Tobacco Use   Smoking status: Former    Types: Cigarettes    Quit date: 01/04/1979    Years since quitting: 43.4   Smokeless tobacco: Never  Vaping Use   Vaping Use: Never used  Substance and Sexual Activity   Alcohol use: Yes    Alcohol/week: 7.0 standard drinks of alcohol    Types: 7 Glasses of wine per week   Drug use: No   Sexual activity: Yes  Other Topics Concern   Not on file  Social History Narrative   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      Home Situation: lives with husband      Spiritual Beliefs: none      Lifestyle: walks about 1 hour daily; no diet plan            Social Determinants of Health   Financial Resource Strain: Low Risk  (06/02/2022)   Overall Financial Resource Strain (CARDIA)    Difficulty of Paying Living Expenses: Not hard at all  Food Insecurity: No Food Insecurity (06/02/2022)   Hunger Vital Sign    Worried About Running Out of Food in the Last Year: Never true    Ran Out of Food in the Last Year: Never true  Transportation Needs: No Transportation Needs (06/02/2022)   PRAPARE - Hydrologist (Medical): No    Lack of Transportation (Non-Medical): No  Physical Activity: Insufficiently Active (06/02/2022)   Exercise Vital Sign    Days of Exercise per Week: 3 days    Minutes of Exercise per Session: 30 min  Stress: No Stress Concern Present (06/02/2022)   [place]    Feeling of Stress : Not at all  Social Connections: Moderately Isolated (06/02/2022)   Social Connection and Isolation Panel [NHANES]    Frequency of Communication with Friends and Family: More than three times a week    Frequency of Social Gatherings with Friends and Family: More than three times a week     Attends Religious Services: Never    Marine scientist or Organizations: No    Attends Music therapist: Never    Marital Status: Married    Tobacco Counseling Counseling given: Not Answered   Clinical Intake:  Pre-visit preparation completed: No  Pain : No/denies pain Pain Score: 0-No pain     BMI - recorded: 31.56 Nutritional Status: BMI > 30  Obese Nutritional Risks: None Diabetes: No  How often do you need to have someone help you when you  read instructions, pamphlets, or other written materials from your doctor or pharmacy?: 1 - Never  Diabetic?  No  Interpreter Needed?: No  Information entered by :: Rolene Arbour LPN   Activities of Daily Living    06/02/2022    8:25 AM  In your present state of health, do you have any difficulty performing the following activities:  Hearing? 0  Vision? 0  Difficulty concentrating or making decisions? 0  Walking or climbing stairs? 0  Dressing or bathing? 0  Doing errands, shopping? 0  Preparing Food and eating ? N  Using the Toilet? N  In the past six months, have you accidently leaked urine? N  Do you have problems with loss of bowel control? N  Managing your Medications? N  Managing your Finances? N  Housekeeping or managing your Housekeeping? N    Patient Care Team: Farrel Conners, MD as PCP - General (Family Medicine) Kem Boroughs, [place] (Obstetrics and Gynecology) Renda Rolls, Jennefer Bravo, MD as Referring Physician (Dermatology)  Indicate any recent Medical Services you may have received from other than [facility] providers in the past year (date may be approximate).     Assessment:   This is a routine wellness examination for [place].  Hearing/Vision screen Hearing Screening - Comments:: Denies hearing difficulties   Vision Screening - Comments:: Wears rx contacts- up to date with routine eye exams with  Dr Delman Cheadle  Dietary issues and exercise activities discussed: Exercise limited  by: None identified   Goals Addressed               This Visit's Progress     Patient Stated (pt-stated)        Continue to lose weight.       Depression Screen    06/02/2022    8:24 AM 04/19/2022    9:21 AM 01/15/2022   11:06 AM 07/27/2021    8:49 AM 05/26/2021    3:04 PM 05/20/2020    8:10 AM 02/25/2020   11:09 AM  PHQ 2/9 Scores  PHQ - 2 Score 0 0 0 1 0 0 0  PHQ- 9 Score 0 0 0 2       Fall Risk    06/02/2022    8:26 AM 06/01/2022    8:37 AM 07/27/2021    8:37 AM 05/26/2021    3:04 PM 05/20/2020    8:12 AM  Fall Risk   Falls in the past year? 0 0 0 0 0  Number falls in past yr: 0    0  Injury with Fall? 0    0  Risk for fall due to : No Fall Risks   Medication side effect Impaired vision  Follow up Falls prevention discussed   Falls evaluation completed;Education provided;Falls prevention discussed Falls prevention discussed    FALL RISK PREVENTION PERTAINING TO THE HOME:  Any stairs in or around the home? Yes  If so, are there any without handrails? No  Home free of loose throw rugs in walkways, pet beds, electrical cords, etc? Yes  Adequate lighting in your home to reduce risk of falls? Yes   ASSISTIVE DEVICES UTILIZED TO PREVENT FALLS:  Life alert? No  Use of a cane, walker or w/c? No  Grab bars in the bathroom? No  Shower chair or bench in shower? No  Elevated toilet seat or a handicapped toilet? No   TIMED UP AND GO:  Was the test performed? No . Audio visit  Cognitive Function:  06/02/2022    8:28 AM 05/26/2021    3:06 PM 05/20/2020    8:14 AM  6CIT Screen  What Year? 0 points 0 points 0 points  What month? 0 points 0 points 0 points  What time? 0 points 0 points   Count back from 20 0 points 0 points 0 points  Months in reverse 0 points 0 points 0 points  Repeat phrase 0 points 0 points 0 points  Total Score 0 points 0 points     Immunizations Immunization History  Administered Date(s) Administered   Influenza, High Dose Seasonal  PF 05/05/2018, 04/18/2019   Influenza-Unspecified 04/18/2014, 04/23/2020   Moderna Covid-19 Vaccine Bivalent Booster 70yr & up 05/25/2021   Moderna Sars-Covid-2 Vaccination 08/20/2019, 09/21/2019, 05/14/2020   PNEUMOCOCCAL CONJUGATE-20 07/27/2021   Pneumococcal Conjugate-13 10/29/2015   Pneumococcal Polysaccharide-23 01/18/2017   Tdap 01/04/2012, 05/19/2021   Zoster Recombinat (Shingrix) 10/01/2016, 01/25/2017   Zoster, Live 01/04/2012    TDAP status: Up to date  Flu Vaccine status: Up to date  Pneumococcal vaccine status: Up to date  Covid-19 vaccine status: Completed vaccines  Qualifies for Shingles Vaccine? Yes   Zostavax completed Yes   Shingrix Completed?: Yes  Screening Tests Health Maintenance  Topic Date Due   COVID-19 Vaccine (5 - Moderna risk series) 06/18/2022 (Originally 07/20/2021)   INFLUENZA VACCINE  10/18/2022 (Originally 02/16/2022)   COLONOSCOPY (Pts 45-432yrInsurance coverage will need to be confirmed)  01/17/2023 (Originally 02/10/2021)   Medicare Annual Wellness ([place] 06/03/2023   MAMMOGRAM  07/29/2023   TETANUS/TDAP  05/20/2031   Pneumonia Vaccine [age]Years old  Completed   DEXA SCAN  Completed   Hepatitis C Screening  Completed   Zoster Vaccines- Shingrix  Completed   HPV VACCINES  Aged Out    Health Maintenance  There are no preventive care reminders to display for this patient.   Colorectal cancer screening: Type of screening: Colonoscopy. Completed 02/11/16. Repeat every 5 years  Mammogram status: Completed 07/28/21. Repeat every year  Bone Density status: Completed 06/10/20. Results reflect: Bone density results: OSTEOPOROSIS. Repeat every   years.  Lung Cancer Screening: (Low Dose CT Chest recommended if Age 73-80ears, 30 pack-year currently smoking OR have quit w/in 15years.) does not qualify.     Additional Screening:  Hepatitis C Screening: does qualify; Completed 01/18/17  Vision Screening: Recommended annual ophthalmology exams  for early detection of glaucoma and other disorders of the eye. Is the patient up to date with their annual eye exam?  Yes  Who is the provider or what is the name of the office in which the patient attends annual eye exams? Dr GoDelman Cheadlef pt is not established with a provider, would they like to be referred to a provider to establish care? No .   Dental Screening: Recommended annual dental exams for proper oral hygiene  Community Resource Referral / Chronic Care Management:  CRR required this visit?  No   CCM required this visit?  No      Plan:     I have personally reviewed and noted the following in the patient's chart:   Medical and social history Use of alcohol, tobacco or illicit drugs  Current medications and supplements including opioid prescriptions. Patient is not currently taking opioid prescriptions. Functional ability and status Nutritional status Physical activity Advanced directives List of other physicians Hospitalizations, surgeries, and ER visits in previous 12 months Vitals Screenings to include cognitive, depression, and falls Referrals and appointments  In addition, I  have reviewed and discussed with patient certain preventive protocols, quality metrics, and best practice recommendations. A written personalized care plan for preventive services as well as general preventive health recommendations were provided to patient.     Criselda Peaches, LPN   [id number]   Nurse Notes: None

## 2022-06-07 DIAGNOSIS — G5602 Carpal tunnel syndrome, left upper limb: Secondary | ICD-10-CM | POA: Insufficient documentation

## 2022-06-22 ENCOUNTER — Encounter: Payer: Self-pay | Admitting: Family Medicine

## 2022-06-22 NOTE — Telephone Encounter (Signed)
Can you answer this question for the patient? I think the whole thing needs scanned in but I'm not sure

## 2022-06-29 ENCOUNTER — Other Ambulatory Visit: Payer: Self-pay | Admitting: Family Medicine

## 2022-06-29 DIAGNOSIS — Z1231 Encounter for screening mammogram for malignant neoplasm of breast: Secondary | ICD-10-CM

## 2022-07-21 DIAGNOSIS — G8918 Other acute postprocedural pain: Secondary | ICD-10-CM | POA: Diagnosis not present

## 2022-07-21 DIAGNOSIS — G5602 Carpal tunnel syndrome, left upper limb: Secondary | ICD-10-CM | POA: Diagnosis not present

## 2022-07-29 DIAGNOSIS — M25632 Stiffness of left wrist, not elsewhere classified: Secondary | ICD-10-CM | POA: Diagnosis not present

## 2022-07-30 ENCOUNTER — Ambulatory Visit
Admission: RE | Admit: 2022-07-30 | Discharge: 2022-07-30 | Disposition: A | Discharge status: Home / Self Care | Payer: Medicare PPO | Source: Ambulatory Visit | Attending: Family Medicine | Admitting: Family Medicine

## 2022-07-30 DIAGNOSIS — Z1231 Encounter for screening mammogram for malignant neoplasm of breast: Secondary | ICD-10-CM

## 2022-08-02 NOTE — Progress Notes (Signed)
Normal mammo, follow up 1 year

## 2022-08-09 IMAGING — MG MM DIGITAL SCREENING BILAT W/ TOMO AND CAD
8 series · 8 of 24 positions shown · non-contrast
Comparison: Previous exam(s).

CLINICAL DATA: Screening.

EXAM:
DIGITAL SCREENING BILATERAL MAMMOGRAM WITH TOMOSYNTHESIS AND CAD
TECHNIQUE: Bilateral screening digital craniocaudal and mediolateral oblique
mammograms were obtained. Bilateral screening digital breast
tomosynthesis was performed. The images were evaluated with
computer-aided detection.

[R MLO synth-2D]
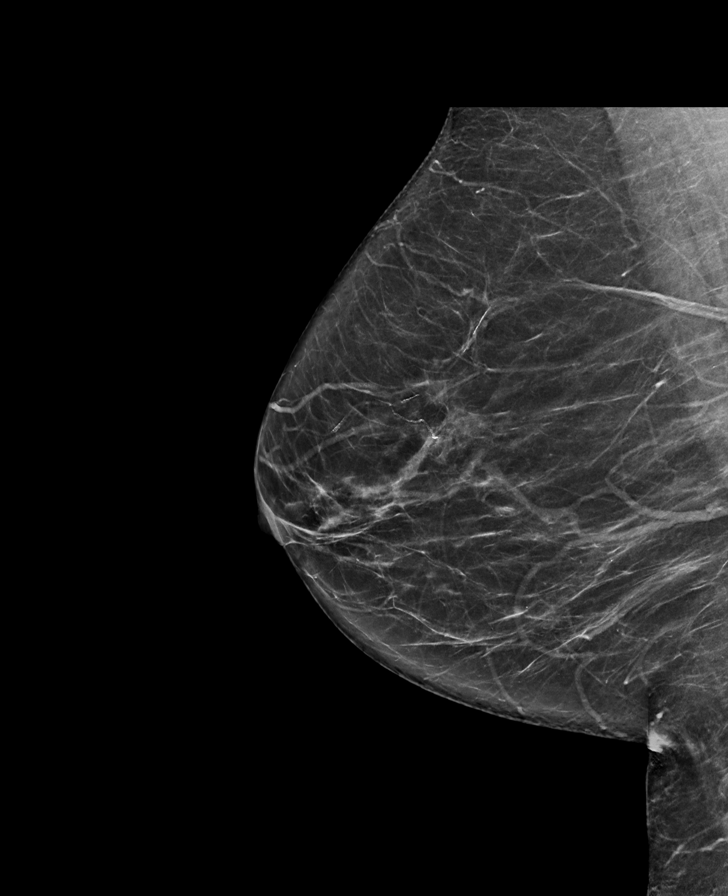

[L CC synth-2D]
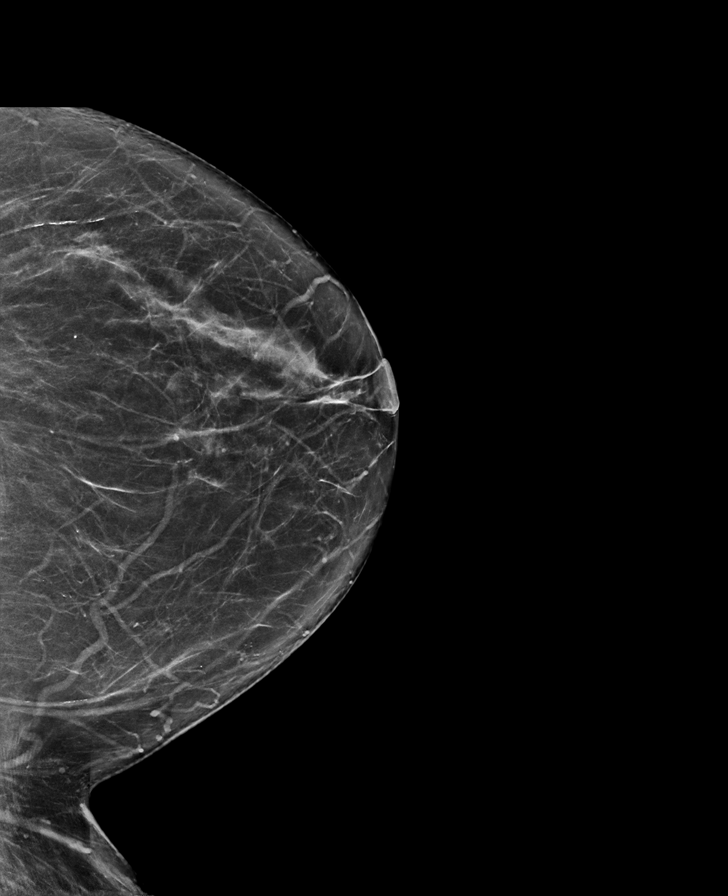

[R CC synth-2D]
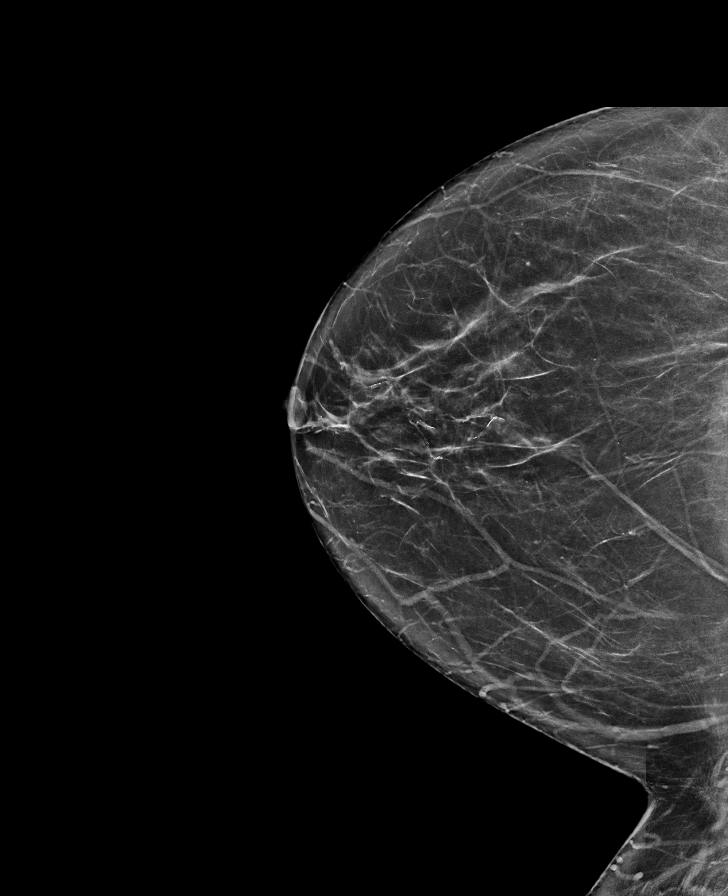

[L MLO synth-2D]
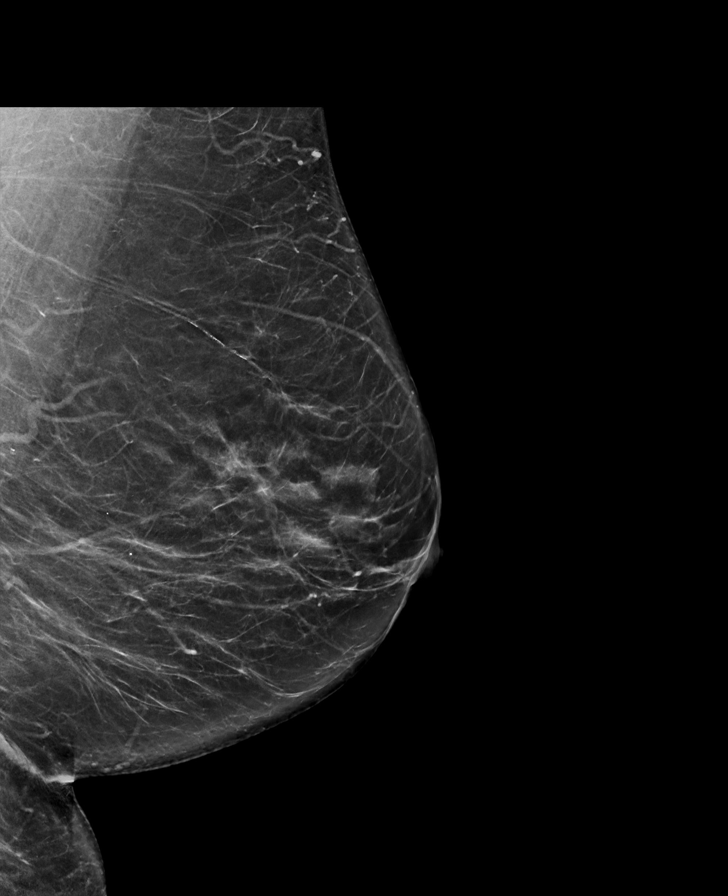

[R CC tomo · tomo slice 37/73.0]
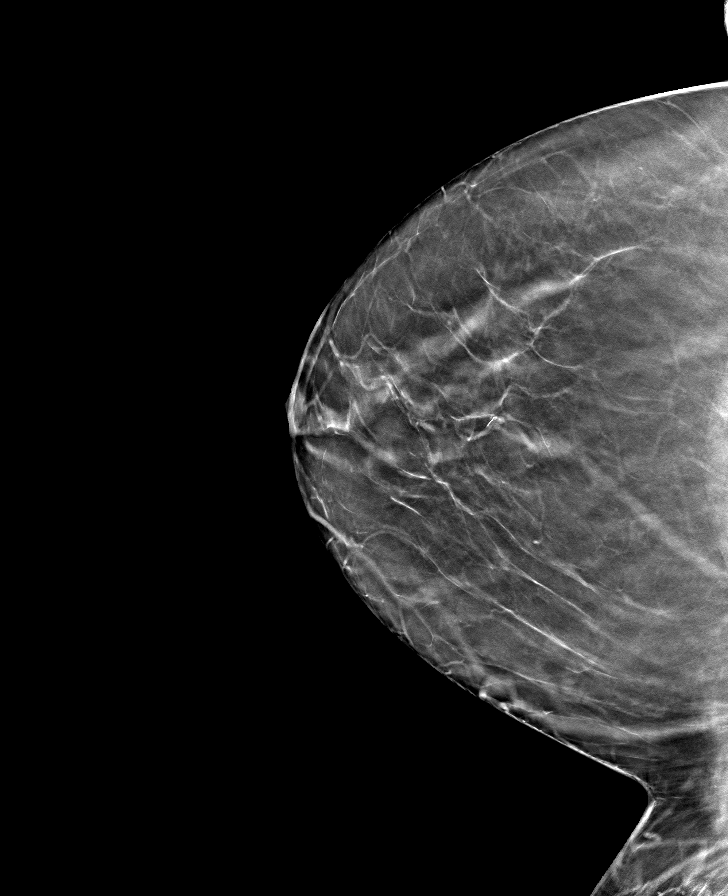

[L CC tomo · tomo slice 37/73.0]
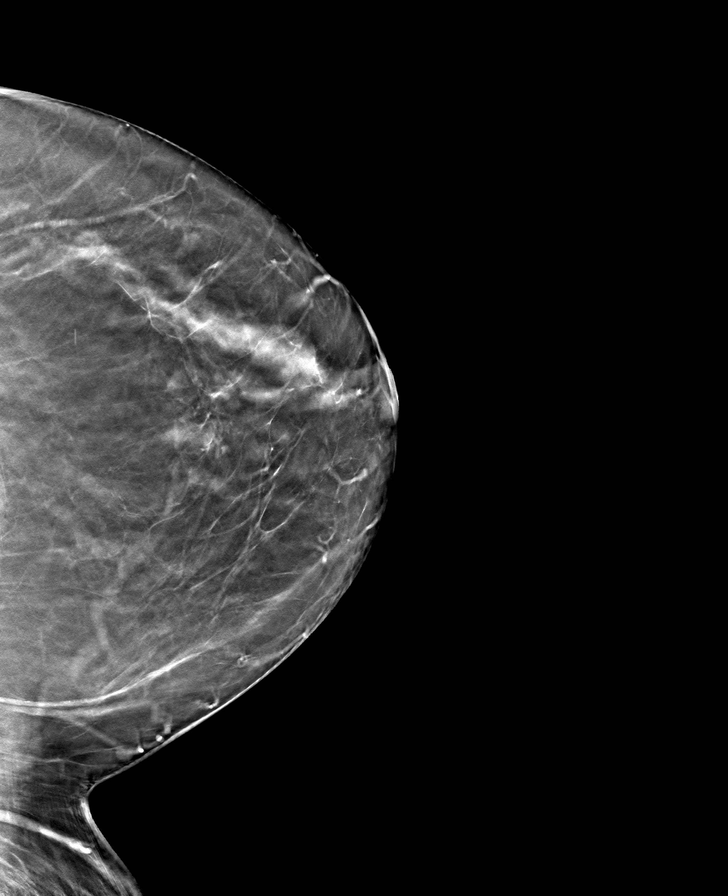

[L MLO tomo · tomo slice 39/78.0]
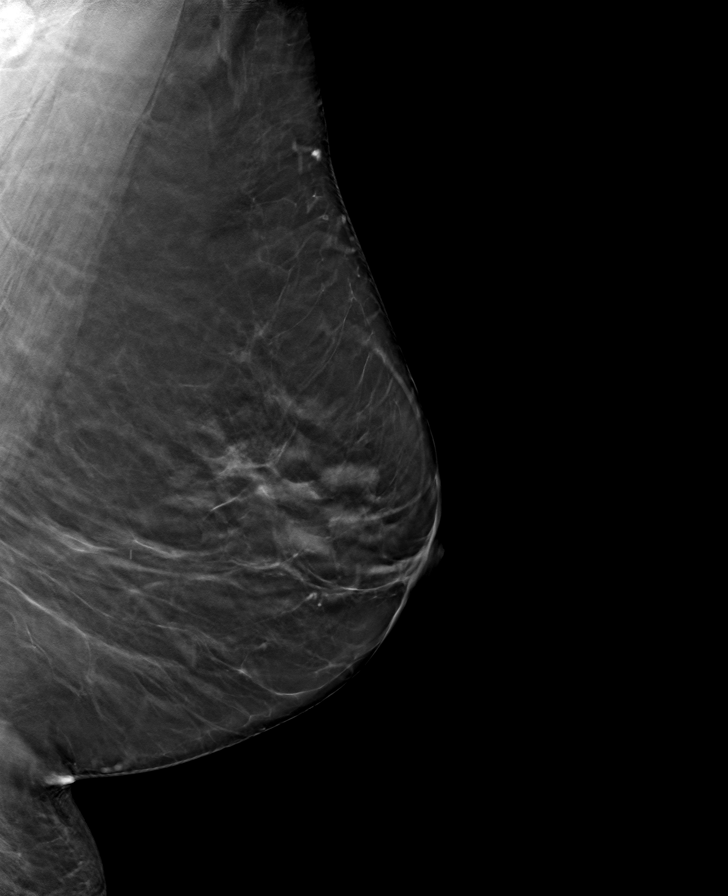

[R MLO tomo · tomo slice 38/75.0]
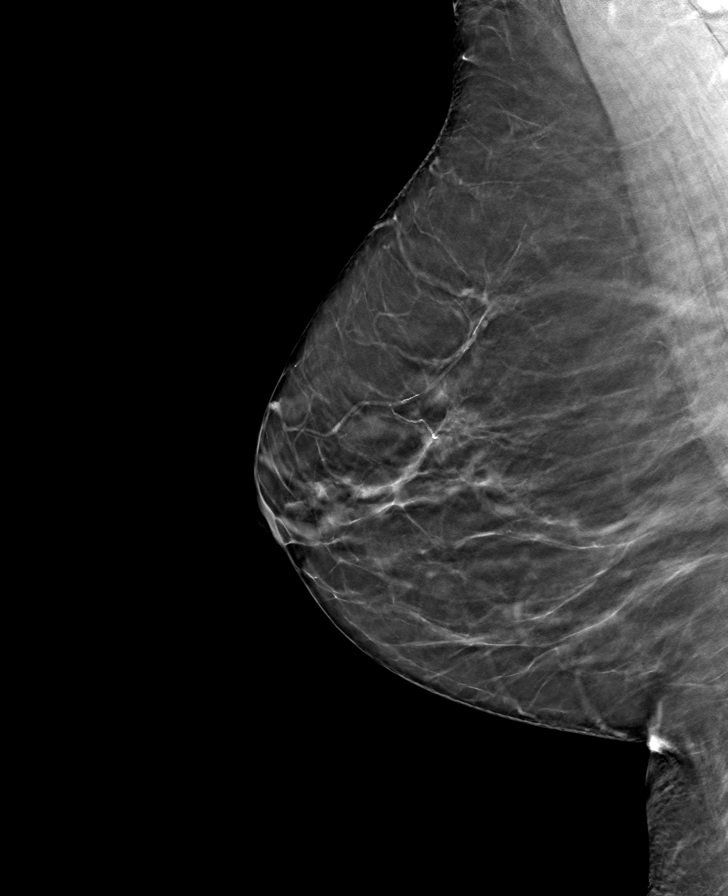

[8 of 24 positions shown; findings below may reference images not displayed]

ACR Breast Density Category b: There are scattered areas of
fibroglandular density.
FINDINGS: There are no findings suspicious for malignancy.
IMPRESSION: No mammographic evidence of malignancy. A result letter of this
screening mammogram will be mailed directly to the patient.

RECOMMENDATION:
Screening mammogram in one year. (Code:51-O-LD2)

BI-RADS CATEGORY  1: Negative.

## 2022-08-19 ENCOUNTER — Ambulatory Visit (INDEPENDENT_AMBULATORY_CARE_PROVIDER_SITE_OTHER): Payer: Medicare PPO | Admitting: Family Medicine

## 2022-08-19 ENCOUNTER — Encounter: Payer: Self-pay | Admitting: Family Medicine

## 2022-08-19 ENCOUNTER — Telehealth: Payer: Self-pay | Admitting: Physical Medicine and Rehabilitation

## 2022-08-19 VITALS — BP 122/68 | HR 67 | Temp 97.8°F | Ht 63.75 in | Wt 177.2 lb

## 2022-08-19 DIAGNOSIS — Z0000 Encounter for general adult medical examination without abnormal findings: Secondary | ICD-10-CM

## 2022-08-19 DIAGNOSIS — R739 Hyperglycemia, unspecified: Secondary | ICD-10-CM

## 2022-08-19 DIAGNOSIS — I10 Essential (primary) hypertension: Secondary | ICD-10-CM | POA: Diagnosis not present

## 2022-08-19 DIAGNOSIS — Z780 Asymptomatic menopausal state: Secondary | ICD-10-CM | POA: Diagnosis not present

## 2022-08-19 DIAGNOSIS — M47816 Spondylosis without myelopathy or radiculopathy, lumbar region: Secondary | ICD-10-CM

## 2022-08-19 LAB — LIPID PANEL
Cholesterol: 260 mg/dL — ABNORMAL HIGH (ref 0–200)
HDL: 76 mg/dL (ref >39.00)
LDL Cholesterol: 170 mg/dL — ABNORMAL HIGH (ref 0–99)
NonHDL: 184.01
Total CHOL/HDL Ratio: 3
Triglycerides: 70 mg/dL (ref 0.0–149.0)
VLDL: 14 mg/dL (ref 0.0–40.0)

## 2022-08-19 LAB — COMPREHENSIVE METABOLIC PANEL
ALT: 15 U/L (ref 0–35)
AST: 21 U/L (ref 0–37)
Albumin: 4.5 g/dL (ref 3.5–5.2)
Alkaline Phosphatase: 74 U/L (ref 39–117)
BUN: 11 mg/dL (ref 6–23)
CO2: 28 mEq/L (ref 19–32)
Calcium: 9.6 mg/dL (ref 8.4–10.5)
Chloride: 101 mEq/L (ref 96–112)
Creatinine, Ser: 0.83 mg/dL (ref 0.40–1.20)
GFR: 70.1 mL/min (ref >60.00)
Glucose, Bld: 95 mg/dL (ref 70–99)
Potassium: 4.3 mEq/L (ref 3.5–5.1)
Sodium: 137 mEq/L (ref 135–145)
Total Bilirubin: 0.5 mg/dL (ref 0.2–1.2)
Total Protein: 7.5 g/dL (ref 6.0–8.3)

## 2022-08-19 LAB — HEMOGLOBIN A1C: Hgb A1c MFr Bld: 5.6 % (ref 4.6–6.5)

## 2022-08-19 MED ORDER — GABAPENTIN 100 MG PO CAPS: ORAL_CAPSULE | ORAL | 1 refills | Status: DC

## 2022-08-19 NOTE — Telephone Encounter (Signed)
Patient called needing to schedule an appointment with Dr. Ernestina Patches for her back. The number to contact patient is [phone]

## 2022-08-19 NOTE — Progress Notes (Signed)
Complete physical exam  Patient: Melinda Ferguson   DOB: Mar 29, 1951   72 y.o. Female  MRN: [id number]  Subjective:    Chief Complaint  Patient presents with   Annual Exam    Melinda Ferguson is a 72 y.o. female who presents today for a complete physical exam. She reports consuming a general diet. Home exercise routine includes walking 0.5 hrs per day. She generally feels well. She reports sleeping fairly well. She does not have additional problems to discuss today.    Most recent fall risk assessment:    08/19/2022    9:25 AM  [place] in the past year? 0  Number falls in past yr: 0  Injury with Fall? 0  Risk for fall due to : No Fall Risks  Follow up Falls evaluation completed     Most recent depression screenings:    08/19/2022    9:29 AM 06/02/2022    8:24 AM  PHQ 2/9 Scores  PHQ - 2 Score 0 0  PHQ- 9 Score 0 0    Dental: No current dental problems and Receives regular dental care  Patient Active Problem List   Diagnosis Date Noted   Lumbar spondylosis 04/19/2022   Osteoarthritis of right knee - sees Dr. Wynelle Link 02/19/2014   HTN (hypertension) 01/01/2013   Allergic rhinitis 01/01/2013      Patient Care Team: Farrel Conners, MD as PCP - General (Family Medicine) Kem Boroughs, [place] (Obstetrics and Gynecology) Renda Rolls, Jennefer Bravo, MD as Referring Physician (Dermatology)   Outpatient Medications Prior to Visit  Medication Sig   Azelastine HCl 0.15 % SOLN 2 actuations in each nostril BID   diphenhydramine-acetaminophen (TYLENOL PM) 25-500 MG TABS tablet Take 1 tablet by mouth at bedtime.   Emollient (COLLAGEN EX) Apply topically.   fexofenadine (ALLEGRA) 60 MG tablet Take 60 mg by mouth daily.   fluticasone (FLONASE) 50 MCG/ACT nasal spray Place 2 sprays into both nostrils daily.   losartan-hydrochlorothiazide (HYZAAR) 100-12.5 MG tablet Take 1 tablet by mouth daily.   meclizine (ANTIVERT) 12.5 MG tablet Take 1 tablet (12.5 mg total) by  mouth 3 (three) times daily as needed for dizziness.   ondansetron (ZOFRAN) 4 MG tablet Take 1 tablet (4 mg total) by mouth every 8 (eight) hours as needed for nausea or vomiting.   psyllium (METAMUCIL) 58.6 % powder Take 1 packet by mouth daily.   [DISCONTINUED] gabapentin (NEURONTIN) 100 MG capsule TAKE 1-3 CAPSULES AT BEDTIME   [DISCONTINUED] BIOTIN PO Take 1 tablet by mouth daily.   [DISCONTINUED] LUTEIN PO Take 1 tablet by mouth daily.   [DISCONTINUED] Misc Natural Products (RESVERATROL DIET PO)    [DISCONTINUED] vitamin E 1000 UNIT capsule Take 1,000 Units by mouth daily.   No facility-administered medications prior to visit.    Review of Systems  HENT:  Negative for hearing loss.   Eyes:  Negative for blurred vision.  Respiratory:  Negative for shortness of breath.   Cardiovascular:  Negative for chest pain.  Gastrointestinal: Negative.   Genitourinary: Negative.   Musculoskeletal:  Negative for back pain.  Neurological:  Negative for headaches.  Psychiatric/Behavioral:  Negative for depression.           Objective:     BP 122/68 (BP Location: Left Arm, Patient Position: Sitting, Cuff Size: Large)   Pulse 67   Temp 97.8 F (36.6 C) (Oral)   Ht 5' 3.75" (1.619 m)   Wt 177 lb 3.2 oz (80.4 kg)  LMP 07/19/1997   SpO2 97%   BMI 30.66 kg/m    Physical Exam Vitals reviewed.  Constitutional:      Appearance: Normal appearance. She is well-groomed and normal weight.  HENT:     Right Ear: Tympanic membrane and ear canal normal.     Left Ear: Tympanic membrane and ear canal normal.     Mouth/Throat:     Mouth: Mucous membranes are moist.     Pharynx: No posterior oropharyngeal erythema.  Eyes:     Conjunctiva/sclera: Conjunctivae normal.  Neck:     Thyroid: No thyromegaly.  Cardiovascular:     Rate and Rhythm: Normal rate and regular rhythm.     Pulses: Normal pulses.     Heart sounds: S1 normal and S2 normal.  Pulmonary:     Effort: Pulmonary effort is  normal.     Breath sounds: Normal breath sounds and air entry.  Abdominal:     General: Bowel sounds are normal.  Musculoskeletal:     Right lower leg: No edema.     Left lower leg: No edema.  Lymphadenopathy:     Cervical: No cervical adenopathy.  Neurological:     Mental Status: She is alert and oriented to person, place, and time. Mental status is at baseline.     Gait: Gait is intact.  Psychiatric:        Mood and Affect: Mood and affect normal.        Speech: Speech normal.        Behavior: Behavior normal.        Judgment: Judgment normal.      No results found for any visits on 08/19/22.     Assessment & Plan:    Routine Health Maintenance and Physical Exam  Immunization History  Administered Date(s) Administered   Influenza, High Dose Seasonal PF 05/05/2018, 04/18/2019   Influenza-Unspecified 04/18/2014, 04/23/2020   Moderna Covid-19 Vaccine Bivalent Booster 36yr & up 05/25/2021   Moderna Sars-Covid-2 Vaccination 08/20/2019, 09/21/2019, 05/14/2020   PNEUMOCOCCAL CONJUGATE-20 07/27/2021   Pneumococcal Conjugate-13 10/29/2015   Pneumococcal Polysaccharide-23 01/18/2017   Tdap 01/04/2012, 05/19/2021   Zoster Recombinat (Shingrix) 10/01/2016, 01/25/2017   Zoster, Live 01/04/2012    Health Maintenance  Topic Date Due   COVID-19 Vaccine (5 - 2023-24 season) 09/04/2022 (Originally 03/19/2022)   INFLUENZA VACCINE  10/18/2022 (Originally 02/16/2022)   COLONOSCOPY (Pts 45-457yrInsurance coverage will need to be confirmed)  01/17/2023 (Originally 02/10/2021)   [place]ic[place] ([place] 06/03/2023   MAMMOGRAM  07/30/2024   DTaP/Tdap/Td (3 - Td or Tdap) 05/20/2031   Pneumonia Vaccine [age]Years old  Completed   DEXA SCAN  Completed   Hepatitis C Screening  Completed   Zoster Vaccines- Shingrix  Completed   HPV VACCINES  Aged Out    Discussed health benefits of physical activity, and encouraged her to engage in regular exercise appropriate for her age and  condition.  Problem List Items Addressed This Visit       Unprioritized   HTN (hypertension)   Relevant Orders   CMP   Lipid Panel   Lumbar spondylosis (Chronic)   Relevant Medications   gabapentin (NEURONTIN) 100 MG capsule   Other Visit Diagnoses     Routine history and physical examination of adult    -  Primary   Physical exam is normal today, I have ordered her annual bloodwork and reviewed her HM, she is due for repeat DEXA.   Hyperglycemia  Relevant Orders   Hemoglobin A1c   Postmenopausal state       Relevant Orders   DG Bone Density      Return in about 6 months (around 02/17/2023) for HTN.     Farrel Conners, MD

## 2022-08-19 NOTE — Patient Instructions (Addendum)
Colonoscopy will be due in July 2024 Health Maintenance, Female Adopting a healthy lifestyle and getting preventive care are important in promoting health and wellness. Ask your health care provider about: The right schedule for you to have regular tests and exams. Things you can do on your own to prevent diseases and keep yourself healthy. What should I know about diet, weight, and exercise? Eat a healthy diet  Eat a diet that includes plenty of vegetables, fruits, low-fat dairy products, and lean protein. Do not eat a lot of foods that are high in solid fats, added sugars, or sodium. Maintain a healthy weight Body mass index (BMI) is used to identify weight problems. It estimates body fat based on height and weight. Your health care provider can help determine your BMI and help you achieve or maintain a healthy weight. Get regular exercise Get regular exercise. This is one of the most important things you can do for your health. Most adults should: Exercise for at least 150 minutes each week. The exercise should increase your heart rate and make you sweat (moderate-intensity exercise). Do strengthening exercises at least twice a week. This is in addition to the moderate-intensity exercise. Spend less time sitting. Even light physical activity can be beneficial. Watch cholesterol and blood lipids Have your blood tested for lipids and cholesterol at 74 years of age, then have this test every 5 years. Have your cholesterol levels checked more often if: Your lipid or cholesterol levels are high. You are older than 74 years of age. You are at high risk for heart disease. What should I know about cancer screening? Depending on your health history and family history, you may need to have cancer screening at various ages. This may include screening for: Breast cancer. Cervical cancer. Colorectal cancer. Skin cancer. Lung cancer. What should I know about heart disease, diabetes, and high blood  pressure? Blood pressure and heart disease High blood pressure causes heart disease and increases the risk of stroke. This is more likely to develop in people who have high blood pressure readings or are overweight. Have your blood pressure checked: Every 3-5 years if you are 74-74 years of age. Every year if you are 74 years old or older. Diabetes Have regular diabetes screenings. This checks your fasting blood sugar level. Have the screening done: Once every three years after age 74 if you are at a normal weight and have a low risk for diabetes. More often and at a younger age if you are overweight or have a high risk for diabetes. What should I know about preventing infection? Hepatitis B If you have a higher risk for hepatitis B, you should be screened for this virus. Talk with your health care provider to find out if you are at risk for hepatitis B infection. Hepatitis C Testing is recommended for: Everyone born from [birth year] through 1967. Anyone with known risk factors for hepatitis C. Sexually transmitted infections (STIs) Get screened for STIs, including gonorrhea and chlamydia, if: You are sexually active and are younger than 74 years of age. You are older than 74 years of age and your health care provider tells you that you are at risk for this type of infection. Your sexual activity has changed since you were last screened, and you are at increased risk for chlamydia or gonorrhea. Ask your health care provider if you are at risk. Ask your health care provider about whether you are at high risk for HIV. Your health care provider may recommend  a prescription medicine to help prevent HIV infection. If you choose to take medicine to prevent HIV, you should first get tested for HIV. You should then be tested every 3 months for as long as you are taking the medicine. Pregnancy If you are about to stop having your period (premenopausal) and you may become pregnant, seek counseling before you  get pregnant. Take 400 to 800 micrograms (mcg) of folic acid every day if you become pregnant. Ask for birth control (contraception) if you want to prevent pregnancy. Osteoporosis and menopause Osteoporosis is a disease in which the bones lose minerals and strength with aging. This can result in bone fractures. If you are 74 years old or older, or if you are at risk for osteoporosis and fractures, ask your health care provider if you should: Be screened for bone loss. Take a calcium or vitamin D supplement to lower your risk of fractures. Be given hormone replacement therapy (HRT) to treat symptoms of menopause. Follow these instructions at home: Alcohol use Do not drink alcohol if: Your health care provider tells you not to drink. You are pregnant, may be pregnant, or are planning to become pregnant. If you drink alcohol: Limit how much you have to: 0-1 drink a day. Know how much alcohol is in your drink. In the [place] one drink equals one 12 oz bottle of beer (355 mL), one 5 oz glass of wine (148 mL), or one 1 oz glass of hard liquor (44 mL). Lifestyle Do not use any products that contain nicotine or tobacco. These products include cigarettes, chewing tobacco, and vaping devices, such as e-cigarettes. If you need help quitting, ask your health care provider. Do not use street drugs. Do not share needles. Ask your health care provider for help if you need support or information about quitting drugs. General instructions Schedule regular health, dental, and eye exams. Stay current with your vaccines. Tell your health care provider if: You often feel depressed. You have ever been abused or do not feel safe at home. Summary Adopting a healthy lifestyle and getting preventive care are important in promoting health and wellness. Follow your health care provider's instructions about healthy diet, exercising, and getting tested or screened for diseases. Follow your health care provider's  instructions on monitoring your cholesterol and blood pressure. This information is not intended to replace advice given to you by your health care provider. Make sure you discuss any questions you have with your health care provider. Document Revised: 11/24/2020 Document Reviewed: 11/24/2020 Elsevier Patient Education  [place].

## 2022-08-20 NOTE — Telephone Encounter (Signed)
Spoke with patient and she stated she is having mid to lower back pain that can go into the buttocks and pelvis. Her PCP stated she should come to Dr. Ernestina Patches since he did her neck. She does not have MRI of back, but does have x-rays. She is not necessarily looking to get an injection. Please advise

## 2022-08-20 NOTE — Telephone Encounter (Signed)
Spoke with patient and scheduled OV for 08/23/22

## 2022-08-23 ENCOUNTER — Encounter: Payer: Self-pay | Admitting: Family Medicine

## 2022-08-23 ENCOUNTER — Ambulatory Visit: Payer: Medicare PPO

## 2022-08-23 ENCOUNTER — Encounter: Payer: Self-pay | Admitting: Physical Medicine and Rehabilitation

## 2022-08-23 ENCOUNTER — Ambulatory Visit: Payer: Medicare PPO | Admitting: Physical Medicine and Rehabilitation

## 2022-08-23 DIAGNOSIS — M47816 Spondylosis without myelopathy or radiculopathy, lumbar region: Secondary | ICD-10-CM | POA: Diagnosis not present

## 2022-08-23 DIAGNOSIS — M7918 Myalgia, other site: Secondary | ICD-10-CM

## 2022-08-23 DIAGNOSIS — M4726 Other spondylosis with radiculopathy, lumbar region: Secondary | ICD-10-CM

## 2022-08-23 DIAGNOSIS — M5416 Radiculopathy, lumbar region: Secondary | ICD-10-CM | POA: Diagnosis not present

## 2022-08-23 DIAGNOSIS — E782 Mixed hyperlipidemia: Secondary | ICD-10-CM

## 2022-08-23 NOTE — Progress Notes (Unsigned)
Functional Pain Scale - descriptive words and definitions  Mild (2)   Noticeable when not distracted/no impact on ADL's/sleep only slightly affected and able to   use both passive and active distraction for comfort. Mild range order  Average Pain 2   Lower back pain with little radiation in the hips

## 2022-08-23 NOTE — Progress Notes (Addendum)
Melinda Ferguson - 72 y.o. female MRN [id number]  Date of birth: 04-06-1951  Office Visit Note: Visit Date: 08/23/2022 PCP: Farrel Conners, MD Referred by: Farrel Conners, MD  Subjective: Chief Complaint  Patient presents with   Lower Back - Pain   HPI: Melinda Ferguson is a 72 y.o. female who comes in today for evaluation of chronic, worsening and severe bilateral lower back pain radiating to left buttock and intermittently up back. Pain ongoing for several years and worsens with movement and activity. She describes pain as sore and aching, currently rates as 7 out of 10. She does report burning sensation to left buttock.  Some relief of pain with home exercise regimen, rest and use of medications.  History of formal chiropractic treatments, some relief of pain with these modalities. Lumbar MRI imaging from 2003 exhibits multi level facet arthropathy and broad based disc protrusion at L5-S1.  Recent lumbar x-rays exhibit multi level spondylosis, most severe at L4-L5 and L5-S1. She was previously treated by Dr. Newman Pies at [facility], states she underwent spinal tap many years ago. Patient is scheduled to start chiropractic treatments with Macario Golds in the coming days. She was previously seen in our office for chronic neck pain, has undergone multiple cervical epidural steroid injections with good relief. Patient denies focal weakness, numbness and tingling. No recent trauma or falls.    Review of Systems  Musculoskeletal:  Positive for back pain and myalgias.  Neurological:  Negative for tingling, sensory change, focal weakness and weakness.  All other systems reviewed and are negative.  Otherwise per HPI.  Assessment & Plan: Visit Diagnoses:    ICD-10-CM   1. Lumbar radiculopathy  M54.16 Ambulatory referral to Physical Therapy    MR LUMBAR SPINE WO CONTRAST    2. Other spondylosis with radiculopathy, lumbar region  M47.26 Ambulatory referral to  Physical Therapy    MR LUMBAR SPINE WO CONTRAST    3. Facet arthropathy, lumbar  M47.816 Ambulatory referral to Physical Therapy    MR LUMBAR SPINE WO CONTRAST    4. Myofascial pain syndrome  M79.18 Ambulatory referral to Physical Therapy    MR LUMBAR SPINE WO CONTRAST       Plan: Findings:  Chronic, worsening and severe bilateral lower back pain radiating to left buttock and intermittently up back.  Patient continues to have severe pain despite good conservative therapy such as chiropractic treatments, home exercise regimen, rest and use of medications.  Patient's clinical presentation and exam are complex, differentials could include lumbar radiculopathy versus myofascial pain syndrome.  She does experience intermittent radiation of pain up her back and tends to worsen with activity. Next step is to obtain new lumbar MRI imaging, I also placed order for formal physical therapy with our in house team. I do feel she could benefit from manual treatments and possible dry needling. We will have patient follow up after imaging is obtained to discuss, we would consider performing epidural steroid injection if warranted. She can continue with chiropractic treatments. I did discuss medication management with her today, she can take Tylenol 500 mg up to three times a day as needed for pain. No red flag symptoms noted upon exam today.     Meds & Orders: No orders of the defined types were placed in this encounter.   Orders Placed This Encounter  Procedures   MR LUMBAR SPINE WO CONTRAST   Ambulatory referral to Physical Therapy    Follow-up: Return  for follow up for lumbar MRI review .   Procedures: No procedures performed      Clinical History: Lumbar MRI (2003) 1.    EXAM: LUMBAR SPINE - COMPLETE 4+ VIEW   COMPARISON:  None.   FINDINGS: No recent fracture is seen. Alignment of posterior margins of vertebral bodies is unremarkable. Degenerative changes are noted with disc space  narrowing, bony spurs and facet hypertrophy throughout lumbar spine, more severe at L4-L5 and L5-S1 levels. Degenerative changes with bony spurs seen in the visualized lower thoracic spine.   IMPRESSION: No recent fracture is seen in the lumbar spine. Lumbar spondylosis, more severe at L4-L5 and L5-S1 levels.     Electronically Signed   By: Elmer Picker M.D.   On: 07/27/2021 15:42   She reports that she quit smoking about 43 years ago. Her smoking use included cigarettes. She has never used smokeless tobacco.  Recent Labs    08/19/22 1013  HGBA1C 5.6    Objective:  VS:  HT:    WT:   BMI:     BP:   HR: bpm  TEMP: ( )  RESP:  Physical Exam Vitals and nursing note reviewed.  HENT:     Head: Normocephalic and atraumatic.     Right Ear: External ear normal.     Left Ear: External ear normal.     Nose: Nose normal.     Mouth/Throat:     Mouth: Mucous membranes are moist.  Eyes:     Extraocular Movements: Extraocular movements intact.  Cardiovascular:     Rate and Rhythm: Normal rate.     Pulses: Normal pulses.  Pulmonary:     Effort: Pulmonary effort is normal.  Abdominal:     General: Abdomen is flat. There is no distension.  Musculoskeletal:        General: Tenderness present.     Cervical back: Normal range of motion.     Comments: Pt rises from seated position to standing without difficulty. Good lumbar range of motion. Strong distal strength without clonus, no pain upon palpation of greater trochanters. Sensation intact bilaterally. Walks independently, gait steady.   Skin:    General: Skin is warm and dry.     Capillary Refill: Capillary refill takes less than 2 seconds.  Neurological:     General: No focal deficit present.     Mental Status: She is alert and oriented to person, place, and time.  Psychiatric:        Mood and Affect: Mood normal.        Behavior: Behavior normal.     Ortho Exam  Imaging: No results found.  Past  Medical/Family/Surgical/Social History: Medications & Allergies reviewed per EMR, new medications updated. Patient Active Problem List   Diagnosis Date Noted   Lumbar spondylosis 04/19/2022   Osteoarthritis of right knee - sees Dr. Wynelle Link 02/19/2014   HTN (hypertension) 01/01/2013   Allergic rhinitis 01/01/2013   Past Medical History:  Diagnosis Date   Allergy    Arthritis    Colon polyps    Hypertension    Family History  Problem Relation Age of Onset   Alzheimer's disease Mother 24       passed at 67   Diverticulosis Mother    Heart disease Father 85       CAD, bypass   Migraines Sister    Colon cancer Maternal Grandfather 26   Esophageal cancer Neg Hx    Stomach cancer Neg Hx  Rectal cancer Neg Hx    Breast cancer Neg Hx    Past Surgical History:  Procedure Laterality Date   COLONOSCOPY     orthoscopy Left 1994   KNEE   TONSILLECTOMY     wisdom teeth extraction  1970   Social History   Occupational History   Occupation: Multimedia programmer    Comment: retired  Tobacco Use   Smoking status: Former    Types: Cigarettes    Quit date: 01/04/1979    Years since quitting: 43.7   Smokeless tobacco: Never  Vaping Use   Vaping Use: Never used  Substance and Sexual Activity   Alcohol use: Yes    Alcohol/week: 7.0 standard drinks of alcohol    Types: 7 Glasses of wine per week   Drug use: No   Sexual activity: Yes

## 2022-08-23 NOTE — Telephone Encounter (Signed)
See prior results note.

## 2022-08-24 MED ORDER — ATORVASTATIN CALCIUM 20 MG PO TABS: 20.0000 mg | ORAL_TABLET | Freq: Every day | ORAL | 1 refills | Status: DC

## 2022-08-24 NOTE — Progress Notes (Signed)
   08/24/22 [place] in the past year? 0  Number of falls in past year 0  Was there an injury with Fall? 0  Fall Risk Category Calculator 0  Fall Risk  Patient at Risk for Falls Due to No Fall Risks

## 2022-08-30 ENCOUNTER — Ambulatory Visit: Payer: Medicare PPO

## 2022-09-08 DIAGNOSIS — H04122 Dry eye syndrome of left lacrimal gland: Secondary | ICD-10-CM | POA: Diagnosis not present

## 2022-09-08 DIAGNOSIS — H10412 Chronic giant papillary conjunctivitis, left eye: Secondary | ICD-10-CM | POA: Diagnosis not present

## 2022-09-09 ENCOUNTER — Ambulatory Visit
Admission: RE | Admit: 2022-09-09 | Discharge: 2022-09-09 | Disposition: A | Discharge status: Home / Self Care | Payer: Medicare PPO | Source: Ambulatory Visit | Attending: Physical Medicine and Rehabilitation | Admitting: Physical Medicine and Rehabilitation

## 2022-09-09 DIAGNOSIS — M47816 Spondylosis without myelopathy or radiculopathy, lumbar region: Secondary | ICD-10-CM

## 2022-09-09 DIAGNOSIS — M48061 Spinal stenosis, lumbar region without neurogenic claudication: Secondary | ICD-10-CM | POA: Diagnosis not present

## 2022-09-09 DIAGNOSIS — M5416 Radiculopathy, lumbar region: Secondary | ICD-10-CM

## 2022-09-09 DIAGNOSIS — M4807 Spinal stenosis, lumbosacral region: Secondary | ICD-10-CM | POA: Diagnosis not present

## 2022-09-09 DIAGNOSIS — M4726 Other spondylosis with radiculopathy, lumbar region: Secondary | ICD-10-CM

## 2022-09-09 DIAGNOSIS — M7918 Myalgia, other site: Secondary | ICD-10-CM

## 2022-09-09 DIAGNOSIS — M545 Low back pain, unspecified: Secondary | ICD-10-CM | POA: Diagnosis not present

## 2022-09-14 ENCOUNTER — Other Ambulatory Visit: Payer: Self-pay | Admitting: Physical Medicine and Rehabilitation

## 2022-09-14 ENCOUNTER — Encounter: Payer: Self-pay | Admitting: Family Medicine

## 2022-09-14 ENCOUNTER — Telehealth: Payer: Self-pay | Admitting: Physical Medicine and Rehabilitation

## 2022-09-14 DIAGNOSIS — M5416 Radiculopathy, lumbar region: Secondary | ICD-10-CM

## 2022-09-14 NOTE — Telephone Encounter (Signed)
Spoke with patient this morning regarding recent lumbar MRI imaging. There are multi level degenerative changes and multi level lateral recess narrowing. We did discuss epidural steroid injection, she would like to proceed with this procedure.

## 2022-09-15 ENCOUNTER — Telehealth: Payer: Self-pay | Admitting: Physical Medicine and Rehabilitation

## 2022-09-15 NOTE — Telephone Encounter (Signed)
Patient returned call to schedule an appointment with Dr. Ernestina Patches. The number to contact patient is [phone]

## 2022-09-15 NOTE — Telephone Encounter (Signed)
Spoke with patient and scheduled injection for 09/23/22

## 2022-09-16 ENCOUNTER — Encounter: Payer: Self-pay | Admitting: Physical Therapy

## 2022-09-16 ENCOUNTER — Other Ambulatory Visit: Payer: Self-pay

## 2022-09-16 ENCOUNTER — Ambulatory Visit: Payer: Medicare PPO | Admitting: Physical Therapy

## 2022-09-16 DIAGNOSIS — M5459 Other low back pain: Secondary | ICD-10-CM

## 2022-09-16 DIAGNOSIS — M6281 Muscle weakness (generalized): Secondary | ICD-10-CM | POA: Diagnosis not present

## 2022-09-16 NOTE — Therapy (Signed)
OUTPATIENT PHYSICAL THERAPY THORACOLUMBAR EVALUATION   Patient Name: Melinda Ferguson MRN: [id number] DOB:October 10, 1950, 72 y.o., female Today's Date: 09/16/2022  END OF SESSION:  PT End of Session - 09/16/22 1419     Visit Number 1    Number of Visits 6    Date for PT Re-Evaluation 10/28/22    Authorization Type [employer]    Authorization - Number of Visits 12    Progress Note Due on Visit 10    PT Start Time [id number]    PT Stop Time 1418    PT Time Calculation (min) 36 min    Activity Tolerance Patient tolerated treatment well    Behavior During Therapy [facility] for tasks assessed/performed             Past Medical History:  Diagnosis Date   Allergy    Arthritis    Colon polyps    Hypertension    Past Surgical History:  Procedure Laterality Date   COLONOSCOPY     orthoscopy Left 1994   KNEE   TONSILLECTOMY     wisdom teeth extraction  1970   Patient Active Problem List   Diagnosis Date Noted   Lumbar spondylosis 04/19/2022   Osteoarthritis of right knee - sees Dr. Wynelle Link 02/19/2014   HTN (hypertension) 01/01/2013   Allergic rhinitis 01/01/2013    PCP: Loralyn Freshwater, MD  REFERRING PROVIDER: Lorine Bears, NP  REFERRING DIAG: M54.16 (ICD-10-CM) - Lumbar radiculopathy M47.26 (ICD-10-CM) - Other spondylosis with radiculopathy, lumbar region M47.816 (ICD-10-CM) - Facet arthropathy, lumbar M79.18 (ICD-10-CM) - Myofascial pain syndrome  Rationale for Evaluation and Treatment: Rehabilitation  THERAPY DIAG:  Other low back pain - Plan: PT plan of care cert/re-cert  Muscle weakness (generalized) - Plan: PT plan of care cert/re-cert  ONSET DATE: 3-4 years ago  SUBJECTIVE:                                                                                                                                                                                           SUBJECTIVE STATEMENT: Pt reports increased LBP over the past 3-4 years ago.  She hasn't had any lumbar  injections at this time.    PERTINENT HISTORY:  HTN, Rt knee OA  PAIN:  Are you having pain? Yes: NPRS scale: 3 currently, up to 6-7, at best 2/10 Pain location: low back, Rt > Lt, wraps around hip and occasionally into groin Pain description: aching, sore Aggravating factors: standing, lifting, carrying groceries Relieving factors: sitting, lying down, pelvic tilts, legs elevated  PRECAUTIONS: None  WEIGHT BEARING RESTRICTIONS: No  FALLS:  Has patient fallen in last 6 months? No  LIVING ENVIRONMENT: Lives with: lives with their spouse Lives in: House/apartment  OCCUPATION: Retired Multimedia programmer for hearing impaired  PLOF: Independent and Leisure: reading, socialize, walking  PATIENT GOALS: improve pain, be able to move/function without fear of pain  NEXT MD VISIT: 09/23/22  OBJECTIVE:   DIAGNOSTIC FINDINGS:  MRI: There are multi level degenerative changes and multi level lateral recess narrowing  PATIENT SURVEYS:  09/16/22: FOTO 48 (predicted 55)  SCREENING FOR RED FLAGS: Bowel or bladder incontinence: No Spinal tumors: No Cauda equina syndrome: No Compression fracture: No Abdominal aneurysm: No  COGNITION: Overall cognitive status: Within functional limits for tasks assessed     SENSATION: 09/16/22: WFL  MUSCLE LENGTH: Hamstrings: no tightness noted; mild hypermobility noted  POSTURE: rounded shoulders and forward head  PALPATION: 09/16/22: unable to reproduce symptoms with palpation, no active trigger points noted  LUMBAR ROM:   AROM eval  Flexion WNL  Extension Limited 25%  Right Quadrant WNL with soreness  Left Quadrant WNL with Rt side soreness   (Blank rows = not tested)  LOWER EXTREMITY MMT:    MMT Right eval Left eval  Hip flexion 3/5 4/5  Hip extension 3/5 3/5  Hip abduction 3/5 3/5  Knee flexion 4/5 4/5  Knee extension 5/5 5/5   (Blank rows = not tested)  LUMBAR SPECIAL TESTS:  09/16/22: Straight leg raise test:  Negative  GAIT: 09/16/22 Comments: independent with mild trendelenburg noted  TODAY'S TREATMENT:                                                                                                                              DATE:  09/16/22 See HEP -performed trial reps PRN for comprehension; mod cues for technique   PATIENT EDUCATION:  Education details: HEP Person educated: Patient Education method: Consulting civil engineer, Demonstration, and Handouts Education comprehension: verbalized understanding, returned demonstration, and needs further education  HOME EXERCISE PROGRAM: Access Code: [email or web address] URL: [email or web address] Date: 09/16/2022 Prepared by: Faustino Congress  Exercises - Supine Posterior Pelvic Tilt  - 2 x daily - 7 x weekly - 1 sets - 10 reps - 5 sec hold - Supine Bridge  - 2 x daily - 7 x weekly - 1 sets - 10 reps - 5 sec hold - Hooklying Single Leg Bent Knee Fallouts with Resistance  - 2 x daily - 7 x weekly - 1 sets - 10 reps - Beginner Front Arm Support  - 2 x daily - 7 x weekly - 1 sets - 10 reps - 3 sec hold - Standing Hip Hiking  - 2 x daily - 7 x weekly - 1 sets - 10 reps - 5 sec  hold   ASSESSMENT:  CLINICAL IMPRESSION: Patient is a 72 y.o. female who was seen today for physical therapy evaluation and treatment for chronic LBP.  She demonstrates mild hypermobility and decreased strength affecting functional mobility. She will benefit from PT to address deficits listed.   OBJECTIVE IMPAIRMENTS:  Abnormal gait, decreased strength, postural dysfunction, and pain.   ACTIVITY LIMITATIONS: carrying, lifting, bending, standing, and locomotion level  PARTICIPATION LIMITATIONS: meal prep, cleaning, laundry, shopping, and community activity  PERSONAL FACTORS: 1-2 comorbidities: HTN, Rt knee OA  are also affecting patient's functional outcome.   REHAB POTENTIAL: Good  CLINICAL DECISION MAKING: Evolving/moderate complexity  EVALUATION COMPLEXITY:  Moderate   GOALS: Goals reviewed with patient? Yes  SHORT TERM GOALS: Target date: 10/07/2022  Independent with initial HEP Goal status: INITIAL   LONG TERM GOALS: Target date: 10/28/2022  Independent with final HEP Goal status: INITIAL  2.  FOTO score improved to 55 Goal status: INITIAL  3.  Bil hip strength improved to 4/5 for improved function Goal status: INIITAL  4.  Report pain < 3/10 with standing and carrying ~ 10#  for improved function Goal status: INITIAL   PLAN:  PT FREQUENCY: 1x/week  PT DURATION: 6 weeks  PLANNED INTERVENTIONS: Therapeutic exercises, Therapeutic activity, Neuromuscular re-education, Gait training, Patient/Family education, Self Care, Joint mobilization, Aquatic Therapy, Dry Needling, Electrical stimulation, Spinal manipulation, Spinal mobilization, Cryotherapy, Moist heat, Taping, Traction, Ultrasound, Ionotophoresis '4mg'$ /ml Dexamethasone, Manual therapy, and Re-evaluation.  PLAN FOR NEXT SESSION: review HEP, continue with stability and strengthening focus   Laureen Abrahams, PT, DPT 09/16/22 2:26 PM    Referring diagnosis? M54.16, M47.26,  M47.816, M79.18 Treatment diagnosis? (if different than referring diagnosis) M54.59, M62.81 What was this (referring dx) caused by? '[]'$  Surgery '[]'$  Fall '[x]'$  Ongoing issue '[x]'$  Arthritis '[]'$  Other: ____________  Laterality: '[]'$  Rt '[]'$  Lt '[x]'$  Both  Check all possible CPT codes:  *CHOOSE 10 OR LESS*    '[]'$  97110 (Therapeutic Exercise)  '[]'$  92507 (SLP Treatment)  '[]'$  [id number] (Neuro Re-ed)   '[]'$  92526 (Swallowing Treatment)   '[]'$  97116 (Gait Training)   '[]'$  [id number] (Cognitive Training, 1st 15 minutes) '[]'$  97140 (Manual Therapy)   '[]'$  97130 (Cognitive Training, each add'l 15 minutes)  '[]'$  97164 (Re-evaluation)                              '[]'$  Other, List CPT Code ____________  '[]'$  97530 (Therapeutic Activities)     '[]'$  97535 (Self Care)   '[x]'$  All codes above (97110 - 97535)  '[]'$  29562 (Mechanical Traction)  '[x]'$   97014 (E-stim Unattended)  '[]'$  97032 (E-stim manual)  '[]'$  97033 (Ionto)  '[]'$  97035 (Ultrasound) '[x]'$  97750 (Physical Performance Training) '[x]'$  [id number] (Aquatic Therapy) '[]'$  97016 (Vasopneumatic Device) '[]'$  [id number] (Paraffin) '[]'$  97034 (Contrast Bath) '[]'$  97597 (Wound Care 1st 20 sq cm) '[]'$  97598 (Wound Care each add'l 20 sq cm) '[]'$  97760 (Orthotic Fabrication, Fitting, Training Initial) '[]'$  [id number] (Prosthetic Management and Training Initial) '[]'$  [id number] (Orthotic or Prosthetic Training/ Modification Subsequent)

## 2022-09-23 ENCOUNTER — Ambulatory Visit: Payer: Self-pay

## 2022-09-23 ENCOUNTER — Ambulatory Visit: Payer: Medicare PPO | Admitting: Physical Medicine and Rehabilitation

## 2022-09-23 VITALS — BP 149/83 | HR 89

## 2022-09-23 DIAGNOSIS — M5416 Radiculopathy, lumbar region: Secondary | ICD-10-CM

## 2022-09-23 MED ORDER — METHYLPREDNISOLONE ACETATE 80 MG/ML IJ SUSP
80.0000 mg | Freq: Once | INTRAMUSCULAR | Status: AC
  Administered 2022-09-23: 80 mg

## 2022-09-23 NOTE — Patient Instructions (Signed)

## 2022-09-23 NOTE — Progress Notes (Signed)
Functional Pain Scale - descriptive words and definitions  Distracting (5)    Aware of pain/able to complete some ADL's but limited by pain/sleep is affected and active distractions are only slightly useful. Moderate range order  Average Pain 1   +Driver, -BT, -Dye Allergies.  Lower back pain on right side with radiation in right hip and buttocks

## 2022-09-27 DIAGNOSIS — L821 Other seborrheic keratosis: Secondary | ICD-10-CM | POA: Diagnosis not present

## 2022-09-27 DIAGNOSIS — Z85828 Personal history of other malignant neoplasm of skin: Secondary | ICD-10-CM | POA: Diagnosis not present

## 2022-09-27 DIAGNOSIS — D485 Neoplasm of uncertain behavior of skin: Secondary | ICD-10-CM | POA: Diagnosis not present

## 2022-09-27 DIAGNOSIS — L570 Actinic keratosis: Secondary | ICD-10-CM | POA: Diagnosis not present

## 2022-09-27 DIAGNOSIS — L814 Other melanin hyperpigmentation: Secondary | ICD-10-CM | POA: Diagnosis not present

## 2022-09-27 DIAGNOSIS — L578 Other skin changes due to chronic exposure to nonionizing radiation: Secondary | ICD-10-CM | POA: Diagnosis not present

## 2022-09-27 DIAGNOSIS — D225 Melanocytic nevi of trunk: Secondary | ICD-10-CM | POA: Diagnosis not present

## 2022-09-27 NOTE — Progress Notes (Signed)
Fuller Canada - 72 y.o. female MRN [id number]  Date of birth: 03-14-1951  Office Visit Note: Visit Date: 09/23/2022 PCP: Farrel Conners, MD Referred by: Farrel Conners, MD  Subjective: Chief Complaint  Patient presents with   Lower Back - Pain   HPI:  Melinda Ferguson is a 72 y.o. female who comes in today at the request of Barnet Pall, FNP for planned Right L3-4 Lumbar Interlaminar epidural steroid injection with fluoroscopic guidance.  The patient has failed conservative care including home exercise, medications, time and activity modification.  This injection will be diagnostic and hopefully therapeutic.  Please see requesting physician notes for further details and justification.   ROS Otherwise per HPI.  Assessment & Plan: Visit Diagnoses:    ICD-10-CM   1. Lumbar radiculopathy  M54.16 XR C-ARM NO REPORT    Epidural Steroid injection    methylPREDNISolone acetate (DEPO-MEDROL) injection 80 mg      Plan: No additional findings.   Meds & Orders:  Meds ordered this encounter  Medications   methylPREDNISolone acetate (DEPO-MEDROL) injection 80 mg    Orders Placed This Encounter  Procedures   XR C-ARM NO REPORT   Epidural Steroid injection    Follow-up: Return for visit to requesting provider as needed.   Procedures: No procedures performed  Lumbar Epidural Steroid Injection - Interlaminar Approach with Fluoroscopic Guidance  Patient: Melinda Ferguson      Date of Birth: 11/12/1950 MRN: [id number] PCP: Farrel Conners, MD      Visit Date: 09/23/2022   Universal Protocol:     Consent Given By: the patient  Position: PRONE  Additional Comments: Vital signs were monitored before and after the procedure. Patient was prepped and draped in the usual sterile fashion. The correct patient, procedure, and site was verified.   Injection Procedure Details:   Procedure diagnoses: Lumbar radiculopathy [M54.16]   Meds Administered:  Meds  ordered this encounter  Medications   methylPREDNISolone acetate (DEPO-MEDROL) injection 80 mg     Laterality: Right  Location/Site:  L3-4  Needle: 3.5 in., 20 ga. Tuohy  Needle Placement: Paramedian epidural  Findings:   -Comments: Excellent flow of contrast into the epidural space.  Procedure Details: Using a paramedian approach from the side mentioned above, the region overlying the inferior lamina was localized under fluoroscopic visualization and the soft tissues overlying this structure were infiltrated with 4 ml. of 1% Lidocaine without Epinephrine. The Tuohy needle was inserted into the epidural space using a paramedian approach.   The epidural space was localized using loss of resistance along with counter oblique bi-planar fluoroscopic views.  After negative aspirate for air, blood, and CSF, a 2 ml. volume of Isovue-250 was injected into the epidural space and the flow of contrast was observed. Radiographs were obtained for documentation purposes.    The injectate was administered into the level noted above.   Additional Comments:  The patient tolerated the procedure well Dressing: 2 x 2 sterile gauze and Band-Aid    Post-procedure details: Patient was observed during the procedure. Post-procedure instructions were reviewed.  Patient left the clinic in stable condition.   Clinical History: Narrative & Impression CLINICAL DATA:  Low back pain for 5 years.   EXAM: MRI LUMBAR SPINE WITHOUT CONTRAST   TECHNIQUE: Multiplanar, multisequence MR imaging of the lumbar spine was performed. No intravenous contrast was administered.   COMPARISON:  Lumbar spine radiographs 07/27/2021   FINDINGS: Segmentation: There are five lumbar type vertebral bodies. The  last full intervertebral disc space is labeled L5-S1. This correlates with the radiographs.   Alignment: Degenerative lumbar spondylosis with mild multilevel degenerative listhesis but normal overall alignment.    Vertebrae:  Normal marrow signal.  No bone lesions or fractures.   Conus medullaris and cauda equina: Conus extends to the L1-2 level. Conus and cauda equina appear normal.   Paraspinal and other soft tissues: Small simple parenchymal and parapelvic renal cysts not requiring any further imaging evaluation or follow-up.   Disc levels:   T12-L1: Mild bulging annulus and mild facet disease with mild bilateral lateral recess encroachment but no spinal foraminal stenosis.   L1-2: Degenerative retrolisthesis of L1 compared L2. Degenerative disc disease with a bulging annulus and osteophytic ridging. There is also mild facet disease and ligamentum flavum thickening but no significant spinal stenosis. Mild bilateral lateral recess stenosis. No significant foraminal stenosis.   L2-3: Bulging annulus, osteophytic ridging and facet disease contributing to early spinal stenosis and mild bilateral lateral recess stenosis. No significant foraminal stenosis.   L3-4: Bulging degenerated annulus, moderate facet disease and ligamentum flavum thickening contributing to mild/early spinal stenosis, mild right lateral recess stenosis and moderate left lateral recess stenosis. No significant foraminal stenosis. Mild left foraminal encroachment.   L4-5: Bulging annulus, osteophytic ridging, facet disease and ligamentum flavum thickening but no significant spinal stenosis. Moderate bilateral lateral recess stenosis, left greater than right and mild bilateral foraminal stenosis, left greater than right.   L5-S1: Advanced facet disease and bulging degenerated annulus but no significant spinal or lateral recess stenosis. Mild right foraminal stenosis.   IMPRESSION: 1. Degenerative lumbar spondylosis with multilevel disc disease and facet disease. 2. Mild bilateral lateral recess stenosis at L1-2. 3. Early spinal stenosis and mild bilateral lateral recess stenosis at L2-3. 4. Mild/early spinal  stenosis, mild right lateral recess stenosis and moderate left lateral recess stenosis at L3-4. Mild left foraminal encroachment. 5. Moderate bilateral lateral recess stenosis, left greater than right and mild bilateral foraminal stenosis, left greater than right at L4-5. 6. Mild right foraminal stenosis at L5-S1.     Electronically Signed   By: Marijo Sanes M.D.   On: 09/12/2022 10:34     Objective:  VS:  HT:    WT:   BMI:     BP:(!) 149/83  HR:89bpm  TEMP: ( )  RESP:  Physical Exam Vitals and nursing note reviewed.  Constitutional:      General: She is not in acute distress.    Appearance: Normal appearance. She is not ill-appearing.  HENT:     Head: Normocephalic and atraumatic.     Right Ear: External ear normal.     Left Ear: External ear normal.  Eyes:     Extraocular Movements: Extraocular movements intact.  Cardiovascular:     Rate and Rhythm: Normal rate.     Pulses: Normal pulses.  Pulmonary:     Effort: Pulmonary effort is normal. No respiratory distress.  Abdominal:     General: There is no distension.     Palpations: Abdomen is soft.  Musculoskeletal:        General: Tenderness present.     Cervical back: Neck supple.     Right lower leg: No edema.     Left lower leg: No edema.     Comments: Patient has good distal strength with no pain over the greater trochanters.  No clonus or focal weakness.  Skin:    Findings: No erythema, lesion or rash.  Neurological:  General: No focal deficit present.     Mental Status: She is alert and oriented to person, place, and time.     Sensory: No sensory deficit.     Motor: No weakness or abnormal muscle tone.     Coordination: Coordination normal.  Psychiatric:        Mood and Affect: Mood normal.        Behavior: Behavior normal.      Imaging: No results found.

## 2022-09-27 NOTE — Procedures (Signed)
Lumbar Epidural Steroid Injection - Interlaminar Approach with Fluoroscopic Guidance  Patient: Melinda Ferguson      Date of Birth: 03-15-1951 MRN: [id number] PCP: Farrel Conners, MD      Visit Date: 09/23/2022   Universal Protocol:     Consent Given By: the patient  Position: PRONE  Additional Comments: Vital signs were monitored before and after the procedure. Patient was prepped and draped in the usual sterile fashion. The correct patient, procedure, and site was verified.   Injection Procedure Details:   Procedure diagnoses: Lumbar radiculopathy [M54.16]   Meds Administered:  Meds ordered this encounter  Medications   methylPREDNISolone acetate (DEPO-MEDROL) injection 80 mg     Laterality: Right  Location/Site:  L3-4  Needle: 3.5 in., 20 ga. Tuohy  Needle Placement: Paramedian epidural  Findings:   -Comments: Excellent flow of contrast into the epidural space.  Procedure Details: Using a paramedian approach from the side mentioned above, the region overlying the inferior lamina was localized under fluoroscopic visualization and the soft tissues overlying this structure were infiltrated with 4 ml. of 1% Lidocaine without Epinephrine. The Tuohy needle was inserted into the epidural space using a paramedian approach.   The epidural space was localized using loss of resistance along with counter oblique bi-planar fluoroscopic views.  After negative aspirate for air, blood, and CSF, a 2 ml. volume of Isovue-250 was injected into the epidural space and the flow of contrast was observed. Radiographs were obtained for documentation purposes.    The injectate was administered into the level noted above.   Additional Comments:  The patient tolerated the procedure well Dressing: 2 x 2 sterile gauze and Band-Aid    Post-procedure details: Patient was observed during the procedure. Post-procedure instructions were reviewed.  Patient left the clinic in stable  condition.

## 2022-09-29 ENCOUNTER — Encounter: Payer: Self-pay | Admitting: Physical Therapy

## 2022-09-29 ENCOUNTER — Ambulatory Visit: Payer: Medicare PPO | Admitting: Physical Therapy

## 2022-09-29 DIAGNOSIS — M5459 Other low back pain: Secondary | ICD-10-CM | POA: Diagnosis not present

## 2022-09-29 DIAGNOSIS — M6281 Muscle weakness (generalized): Secondary | ICD-10-CM

## 2022-09-29 DIAGNOSIS — R252 Cramp and spasm: Secondary | ICD-10-CM

## 2022-09-29 DIAGNOSIS — H04212 Epiphora due to excess lacrimation, left lacrimal gland: Secondary | ICD-10-CM | POA: Diagnosis not present

## 2022-09-29 NOTE — Therapy (Signed)
OUTPATIENT PHYSICAL THERAPY TREATMENT   Patient Name: AMARIEA ZICH MRN: [id number] DOB:1950-11-11, 72 y.o., female Today's Date: 09/29/2022  END OF SESSION:  PT End of Session - 09/29/22 1149     Visit Number 2    Number of Visits 6    Date for PT Re-Evaluation 10/28/22    Authorization Type [employer]    Authorization Time Period 09/16/22-10/28/22    Authorization - Visit Number 2    Authorization - Number of Visits 12    Progress Note Due on Visit 10    PT Start Time [id number]    PT Stop Time 1225    PT Time Calculation (min) 40 min    Activity Tolerance Patient tolerated treatment well    Behavior During Therapy [facility] for tasks assessed/performed              Past Medical History:  Diagnosis Date   Allergy    Arthritis    Colon polyps    Hypertension    Past Surgical History:  Procedure Laterality Date   COLONOSCOPY     orthoscopy Left 1994   KNEE   TONSILLECTOMY     wisdom teeth extraction  1970   Patient Active Problem List   Diagnosis Date Noted   Lumbar spondylosis 04/19/2022   Osteoarthritis of right knee - sees Dr. Wynelle Link 02/19/2014   HTN (hypertension) 01/01/2013   Allergic rhinitis 01/01/2013    PCP: Loralyn Freshwater, MD  REFERRING PROVIDER: Lorine Bears, NP  REFERRING DIAG: M54.16 (ICD-10-CM) - Lumbar radiculopathy M47.26 (ICD-10-CM) - Other spondylosis with radiculopathy, lumbar region M47.816 (ICD-10-CM) - Facet arthropathy, lumbar M79.18 (ICD-10-CM) - Myofascial pain syndrome  Rationale for Evaluation and Treatment: Rehabilitation  THERAPY DIAG:  Other low back pain  Muscle weakness (generalized)  Cramp and spasm  ONSET DATE: 3-4 years ago  SUBJECTIVE:                                                                                                                                                                                           SUBJECTIVE STATEMENT: Had injection last week; area that has been aching and radiating has been  much better.  Feels exercises are helping to support areas of pain.  Pain over several days has been 1-2/10 only.  PERTINENT HISTORY:  HTN, Rt knee OA  PAIN:  Are you having pain? Yes: NPRS scale: 1 currently, up to 5, at best 1/10 Pain location: low back, Rt > Lt, wraps around hip and occasionally into groin Pain description: aching, sore Aggravating factors: standing, lifting, carrying groceries Relieving factors: sitting, lying down, pelvic tilts, legs elevated  PRECAUTIONS:  None  WEIGHT BEARING RESTRICTIONS: No  FALLS:  Has patient fallen in last 6 months? No  LIVING ENVIRONMENT: Lives with: lives with their spouse Lives in: House/apartment  OCCUPATION: Retired Multimedia programmer for hearing impaired  PLOF: Independent and Leisure: reading, socialize, walking  PATIENT GOALS: improve pain, be able to move/function without fear of pain  NEXT MD VISIT: 09/23/22  OBJECTIVE:   DIAGNOSTIC FINDINGS:  MRI: There are multi level degenerative changes and multi level lateral recess narrowing  PATIENT SURVEYS:  09/16/22: FOTO 48 (predicted 55)  SCREENING FOR RED FLAGS: Bowel or bladder incontinence: No Spinal tumors: No Cauda equina syndrome: No Compression fracture: No Abdominal aneurysm: No  COGNITION: Overall cognitive status: Within functional limits for tasks assessed     SENSATION: 09/16/22: WFL  MUSCLE LENGTH: Hamstrings: no tightness noted; mild hypermobility noted  POSTURE: rounded shoulders and forward head  PALPATION: 09/16/22: unable to reproduce symptoms with palpation, no active trigger points noted  LUMBAR ROM:   AROM eval  Flexion WNL  Extension Limited 25%  Right Quadrant WNL with soreness  Left Quadrant WNL with Rt side soreness   (Blank rows = not tested)  LOWER EXTREMITY MMT:    MMT Right eval Left eval  Hip flexion 3/5 4/5  Hip extension 3/5 3/5  Hip abduction 3/5 3/5  Knee flexion 4/5 4/5  Knee extension 5/5 5/5   (Blank rows =  not tested)  LUMBAR SPECIAL TESTS:  09/16/22: Straight leg raise test: Negative  GAIT: 09/16/22 Comments: independent with mild trendelenburg noted  TODAY'S TREATMENT:                                                                                                                              DATE:  09/29/22 TherEx NuStep L6 x 8 min Standing hip hike x 10 reps bil; 5 sec hold Supine pelvic tilt x10 reps; 5 sec hold Hooklying single limb clamshell x10 reps bil Quadruped hip extension x 10 reps bil Quadruped UE/LE x 5 reps each way (small range) Prone opposite UE/LE extension x 5 reps bil Standing hip abduction with L3 band x 10 reps bil Standing hip extension with L3 band x 10 reps bil Standing rows L3 band with 5 sec hold; x10 reps Standing shoulder extension x 10 reps; 5 sec hold  09/16/22 See HEP -performed trial reps PRN for comprehension; mod cues for technique   PATIENT EDUCATION:  Education details: HEP Person educated: Patient Education method: Consulting civil engineer, Demonstration, and Handouts Education comprehension: verbalized understanding, returned demonstration, and needs further education  HOME EXERCISE PROGRAM: Access Code: [email or web address] URL: [email or web address] Date: 09/29/2022 Prepared by: Faustino Congress  Exercises - Supine Posterior Pelvic Tilt  - 2 x daily - 7 x weekly - 1 sets - 10 reps - 5 sec hold - Supine Bridge  - 2 x daily - 7 x weekly - 1 sets - 10 reps - 5 sec hold - Hooklying Single Leg Bent Knee Fallouts  with Resistance  - 2 x daily - 7 x weekly - 1 sets - 10 reps - Beginner Front Arm Support  - 2 x daily - 7 x weekly - 1 sets - 10 reps - 3 sec hold - Standing Hip Hiking  - 2 x daily - 7 x weekly - 1 sets - 10 reps - 5 sec  hold - Bird Dog  - 2 x daily - 7 x weekly - 1 sets - 10 reps - Prone Alternating Arm and Leg Lifts  - 1 x daily - 7 x weekly - 1 sets - 10 reps - Standing Hip Abduction with Resistance at Ankles and Counter Support  - 1  x daily - 7 x weekly - 1 sets - 10 reps - Standing Hip Extension with Resistance at Ankles and Unilateral Counter Support  - 1 x daily - 7 x weekly - 1 sets - 10 reps  ASSESSMENT:  CLINICAL IMPRESSION: Session today focused on review of HEP and we were able to progress HEP today as well.  Overall reporting reduction in pain and symptoms and progressing well with PT.  May only need a few more visits if continues to do well.    OBJECTIVE IMPAIRMENTS: Abnormal gait, decreased strength, postural dysfunction, and pain.   ACTIVITY LIMITATIONS: carrying, lifting, bending, standing, and locomotion level  PARTICIPATION LIMITATIONS: meal prep, cleaning, laundry, shopping, and community activity  PERSONAL FACTORS: 1-2 comorbidities: HTN, Rt knee OA  are also affecting patient's functional outcome.   REHAB POTENTIAL: Good  CLINICAL DECISION MAKING: Evolving/moderate complexity  EVALUATION COMPLEXITY: Moderate   GOALS: Goals reviewed with patient? Yes  SHORT TERM GOALS: Target date: 10/07/2022  Independent with initial HEP Goal status: MET 09/29/22   LONG TERM GOALS: Target date: 10/28/2022  Independent with final HEP Goal status: INITIAL  2.  FOTO score improved to 55 Goal status: INITIAL  3.  Bil hip strength improved to 4/5 for improved function Goal status: INIITAL  4.  Report pain < 3/10 with standing and carrying ~ 10#  for improved function Goal status: INITIAL   PLAN:  PT FREQUENCY: 1x/week  PT DURATION: 6 weeks  PLANNED INTERVENTIONS: Therapeutic exercises, Therapeutic activity, Neuromuscular re-education, Gait training, Patient/Family education, Self Care, Joint mobilization, Aquatic Therapy, Dry Needling, Electrical stimulation, Spinal manipulation, Spinal mobilization, Cryotherapy, Moist heat, Taping, Traction, Ultrasound, Ionotophoresis '4mg'$ /ml Dexamethasone, Manual therapy, and Re-evaluation.  PLAN FOR NEXT SESSION: review HEP PRN, continue with stability and  strengthening focus   Laureen Abrahams, PT, DPT 09/29/22 12:44 PM

## 2022-09-30 ENCOUNTER — Encounter: Payer: Medicare PPO | Admitting: Physical Therapy

## 2022-10-06 ENCOUNTER — Encounter: Payer: Self-pay | Admitting: Physical Therapy

## 2022-10-06 ENCOUNTER — Ambulatory Visit: Payer: Medicare PPO | Admitting: Physical Therapy

## 2022-10-06 DIAGNOSIS — M5459 Other low back pain: Secondary | ICD-10-CM

## 2022-10-06 DIAGNOSIS — M6281 Muscle weakness (generalized): Secondary | ICD-10-CM

## 2022-10-06 DIAGNOSIS — R252 Cramp and spasm: Secondary | ICD-10-CM | POA: Diagnosis not present

## 2022-10-06 NOTE — Therapy (Signed)
OUTPATIENT PHYSICAL THERAPY TREATMENT   Patient Name: Melinda Ferguson MRN: [id number] DOB:Dec 15, 1950, 72 y.o., female Today's Date: 10/06/2022  END OF SESSION:  PT End of Session - 10/06/22 1305     Visit Number 3    Number of Visits 6    Date for PT Re-Evaluation 10/28/22    Authorization Type [employer]    Authorization Time Period 09/16/22-10/28/22    Authorization - Number of Visits 12    Progress Note Due on Visit 10    PT Start Time 1302    PT Stop Time 1342    PT Time Calculation (min) 40 min    Activity Tolerance Patient tolerated treatment well    Behavior During Therapy [facility] for tasks assessed/performed               Past Medical History:  Diagnosis Date   Allergy    Arthritis    Colon polyps    Hypertension    Past Surgical History:  Procedure Laterality Date   COLONOSCOPY     orthoscopy Left 1994   KNEE   TONSILLECTOMY     wisdom teeth extraction  1970   Patient Active Problem List   Diagnosis Date Noted   Lumbar spondylosis 04/19/2022   Osteoarthritis of right knee - sees Dr. Wynelle Link 02/19/2014   HTN (hypertension) 01/01/2013   Allergic rhinitis 01/01/2013    PCP: Loralyn Freshwater, MD  REFERRING PROVIDER: Lorine Bears, NP  REFERRING DIAG: M54.16 (ICD-10-CM) - Lumbar radiculopathy M47.26 (ICD-10-CM) - Other spondylosis with radiculopathy, lumbar region M47.816 (ICD-10-CM) - Facet arthropathy, lumbar M79.18 (ICD-10-CM) - Myofascial pain syndrome  Rationale for Evaluation and Treatment: Rehabilitation  THERAPY DIAG:  Other low back pain  Muscle weakness (generalized)  Cramp and spasm  ONSET DATE: 3-4 years ago  SUBJECTIVE:                                                                                                                                                                                           SUBJECTIVE STATEMENT: Did new exercises; but not as much as the initial ones.  Pain continues to be pretty low; pain would go up  to 4-5/10 with increased activity.    PERTINENT HISTORY:  HTN, Rt knee OA  PAIN:  Are you having pain? Yes: NPRS scale: 1 currently, up to 5, at best 1/10 Pain location: low back, Rt > Lt, wraps around hip and occasionally into groin Pain description: aching, sore Aggravating factors: standing, lifting, carrying groceries Relieving factors: sitting, lying down, pelvic tilts, legs elevated  PRECAUTIONS: None  WEIGHT BEARING RESTRICTIONS: No  FALLS:  Has patient fallen  in last 6 months? No  LIVING ENVIRONMENT: Lives with: lives with their spouse Lives in: House/apartment  OCCUPATION: Retired Multimedia programmer for hearing impaired  PLOF: Independent and Leisure: reading, socialize, walking  PATIENT GOALS: improve pain, be able to move/function without fear of pain  NEXT MD VISIT: 09/23/22  OBJECTIVE:   DIAGNOSTIC FINDINGS:  MRI: There are multi level degenerative changes and multi level lateral recess narrowing  PATIENT SURVEYS:  09/16/22: FOTO 48 (predicted 55)  SCREENING FOR RED FLAGS: Bowel or bladder incontinence: No Spinal tumors: No Cauda equina syndrome: No Compression fracture: No Abdominal aneurysm: No  COGNITION: Overall cognitive status: Within functional limits for tasks assessed     SENSATION: 09/16/22: WFL  MUSCLE LENGTH: Hamstrings: no tightness noted; mild hypermobility noted  POSTURE: rounded shoulders and forward head  PALPATION: 09/16/22: unable to reproduce symptoms with palpation, no active trigger points noted  LUMBAR ROM:   AROM eval  Flexion WNL  Extension Limited 25%  Right Quadrant WNL with soreness  Left Quadrant WNL with Rt side soreness   (Blank rows = not tested)  LOWER EXTREMITY MMT:    MMT Right eval Left eval  Hip flexion 3/5 4/5  Hip extension 3/5 3/5  Hip abduction 3/5 3/5  Knee flexion 4/5 4/5  Knee extension 5/5 5/5   (Blank rows = not tested)  LUMBAR SPECIAL TESTS:  09/16/22: Straight leg raise test:  Negative  GAIT: 09/16/22 Comments: independent with mild trendelenburg noted  TODAY'S TREATMENT:                                                                                                                              DATE:  10/06/22 TherEx NuStep L6 x 8 min Supine Rt isometric hip extension in 90/90 10 x 5 sec hold Bridge with strap 10 x 5 sec hold  Manual STM with compression to Rt QL and Rt glute med; skilled palpation and monitoring of soft tissue during DN  Trigger Point Dry-Needling  Treatment instructions: Expect mild to moderate muscle soreness. S/S of pneumothorax if dry needled over a lung field, and to seek immediate medical attention should they occur. Patient verbalized understanding of these instructions and education.  Patient Consent Given: Yes Education handout provided: Yes Muscles treated: Rt QL, Rt glute med Electrical stimulation performed: No Parameters: N/A Treatment response/outcome: twitch responses with decreased pain   09/29/22 TherEx NuStep L6 x 8 min Standing hip hike x 10 reps bil; 5 sec hold Supine pelvic tilt x10 reps; 5 sec hold Hooklying single limb clamshell x10 reps bil Quadruped hip extension x 10 reps bil Quadruped UE/LE x 5 reps each way (small range) Prone opposite UE/LE extension x 5 reps bil Standing hip abduction with L3 band x 10 reps bil Standing hip extension with L3 band x 10 reps bil Standing rows L3 band with 5 sec hold; x10 reps Standing shoulder extension x 10 reps; 5 sec hold  09/16/22 See HEP -  performed trial reps PRN for comprehension; mod cues for technique   PATIENT EDUCATION:  Education details: HEP Person educated: Patient Education method: Explanation, Demonstration, and Handouts Education comprehension: verbalized understanding, returned demonstration, and needs further education  HOME EXERCISE PROGRAM: Access Code: [email or web address] URL: [email or web address] Date: 09/29/2022 Prepared by:  Faustino Congress  Exercises - Supine Posterior Pelvic Tilt  - 2 x daily - 7 x weekly - 1 sets - 10 reps - 5 sec hold - Supine Bridge  - 2 x daily - 7 x weekly - 1 sets - 10 reps - 5 sec hold - Hooklying Single Leg Bent Knee Fallouts with Resistance  - 2 x daily - 7 x weekly - 1 sets - 10 reps - Beginner Front Arm Support  - 2 x daily - 7 x weekly - 1 sets - 10 reps - 3 sec hold - Standing Hip Hiking  - 2 x daily - 7 x weekly - 1 sets - 10 reps - 5 sec  hold - Bird Dog  - 2 x daily - 7 x weekly - 1 sets - 10 reps - Prone Alternating Arm and Leg Lifts  - 1 x daily - 7 x weekly - 1 sets - 10 reps - Standing Hip Abduction with Resistance at Ankles and Counter Support  - 1 x daily - 7 x weekly - 1 sets - 10 reps - Standing Hip Extension with Resistance at Ankles and Unilateral Counter Support  - 1 x daily - 7 x weekly - 1 sets - 10 reps  ASSESSMENT:  CLINICAL IMPRESSION: Pt tolerated session well today, trial of DN and manual therapy to see if this helps with some muscular pain.  Will continue to benefit from PT to maximize function.  OBJECTIVE IMPAIRMENTS: Abnormal gait, decreased strength, postural dysfunction, and pain.   ACTIVITY LIMITATIONS: carrying, lifting, bending, standing, and locomotion level  PARTICIPATION LIMITATIONS: meal prep, cleaning, laundry, shopping, and community activity  PERSONAL FACTORS: 1-2 comorbidities: HTN, Rt knee OA  are also affecting patient's functional outcome.   REHAB POTENTIAL: Good  CLINICAL DECISION MAKING: Evolving/moderate complexity  EVALUATION COMPLEXITY: Moderate   GOALS: Goals reviewed with patient? Yes  SHORT TERM GOALS: Target date: 10/07/2022  Independent with initial HEP Goal status: MET 09/29/22   LONG TERM GOALS: Target date: 10/28/2022  Independent with final HEP Goal status: INITIAL  2.  FOTO score improved to 55 Goal status: INITIAL  3.  Bil hip strength improved to 4/5 for improved function Goal status: INIITAL  4.   Report pain < 3/10 with standing and carrying ~ 10#  for improved function Goal status: INITIAL   PLAN:  PT FREQUENCY: 1x/week  PT DURATION: 6 weeks  PLANNED INTERVENTIONS: Therapeutic exercises, Therapeutic activity, Neuromuscular re-education, Gait training, Patient/Family education, Self Care, Joint mobilization, Aquatic Therapy, Dry Needling, Electrical stimulation, Spinal manipulation, Spinal mobilization, Cryotherapy, Moist heat, Taping, Traction, Ultrasound, Ionotophoresis 4mg /ml Dexamethasone, Manual therapy, and Re-evaluation.  PLAN FOR NEXT SESSION: assess response to DN, review HEP PRN, continue with stability and strengthening focus   Laureen Abrahams, PT, DPT 10/06/22 1:52 PM

## 2022-10-13 ENCOUNTER — Ambulatory Visit: Payer: Medicare PPO | Admitting: Physical Therapy

## 2022-10-13 ENCOUNTER — Other Ambulatory Visit: Payer: Self-pay | Admitting: *Deleted

## 2022-10-13 ENCOUNTER — Encounter: Payer: Medicare PPO | Admitting: Physical Therapy

## 2022-10-13 ENCOUNTER — Encounter: Payer: Self-pay | Admitting: Physical Therapy

## 2022-10-13 DIAGNOSIS — R252 Cramp and spasm: Secondary | ICD-10-CM | POA: Diagnosis not present

## 2022-10-13 DIAGNOSIS — M6281 Muscle weakness (generalized): Secondary | ICD-10-CM

## 2022-10-13 DIAGNOSIS — M5459 Other low back pain: Secondary | ICD-10-CM

## 2022-10-13 MED ORDER — FLUTICASONE PROPIONATE 50 MCG/ACT NA SUSP: 2.0000 | Freq: Every day | NASAL | 5 refills | Status: AC

## 2022-10-13 MED ORDER — AZELASTINE HCL 0.15 % NA SOLN: NASAL | 5 refills | Status: AC

## 2022-10-13 NOTE — Therapy (Signed)
OUTPATIENT PHYSICAL THERAPY TREATMENT   Patient Name: Melinda Ferguson MRN: [id number] DOB:1951-01-20, 72 y.o., female Today's Date: 10/13/2022  END OF SESSION:  PT End of Session - 10/13/22 1020     Visit Number 4    Number of Visits 6    Date for PT Re-Evaluation 10/28/22    Authorization Type [employer]    Authorization Time Period 09/16/22-10/28/22    Authorization - Number of Visits 12    Progress Note Due on Visit 10    PT Start Time 1012    PT Stop Time 1050    PT Time Calculation (min) 38 min    Activity Tolerance Patient tolerated treatment well    Behavior During Therapy WFL for tasks assessed/performed                Past Medical History:  Diagnosis Date   Allergy    Arthritis    Colon polyps    Hypertension    Past Surgical History:  Procedure Laterality Date   COLONOSCOPY     orthoscopy Left 1994   KNEE   TONSILLECTOMY     wisdom teeth extraction  1970   Patient Active Problem List   Diagnosis Date Noted   Lumbar spondylosis 04/19/2022   Osteoarthritis of right knee - sees Dr. Wynelle Link 02/19/2014   HTN (hypertension) 01/01/2013   Allergic rhinitis 01/01/2013    PCP: Loralyn Freshwater, MD  REFERRING PROVIDER: Lorine Bears, NP  REFERRING DIAG: M54.16 (ICD-10-CM) - Lumbar radiculopathy M47.26 (ICD-10-CM) - Other spondylosis with radiculopathy, lumbar region M47.816 (ICD-10-CM) - Facet arthropathy, lumbar M79.18 (ICD-10-CM) - Myofascial pain syndrome  Rationale for Evaluation and Treatment: Rehabilitation  THERAPY DIAG:  Other low back pain  Muscle weakness (generalized)  Cramp and spasm  ONSET DATE: 3-4 years ago  SUBJECTIVE:                                                                                                                                                                                           SUBJECTIVE STATEMENT: Pain is very mild; feels like it's more sore than anything  PERTINENT HISTORY:  HTN, Rt knee  OA  PAIN:  Are you having pain? Yes: NPRS scale: 1 currently, up to 5, at best 1/10 Pain location: low back, Rt > Lt, wraps around hip and occasionally into groin Pain description: aching, sore Aggravating factors: standing, lifting, carrying groceries Relieving factors: sitting, lying down, pelvic tilts, legs elevated  PRECAUTIONS: None  WEIGHT BEARING RESTRICTIONS: No  FALLS:  Has patient fallen in last 6 months? No  LIVING ENVIRONMENT: Lives with: lives with their spouse Lives in: House/apartment  OCCUPATION: Retired Multimedia programmer for hearing impaired  PLOF: Independent and Leisure: reading, socialize, walking  PATIENT GOALS: improve pain, be able to move/function without fear of pain  NEXT MD VISIT: 09/23/22  OBJECTIVE:   DIAGNOSTIC FINDINGS:  MRI: There are multi level degenerative changes and multi level lateral recess narrowing  PATIENT SURVEYS:  09/16/22: FOTO 48 (predicted 55)  SCREENING FOR RED FLAGS: Bowel or bladder incontinence: No Spinal tumors: No Cauda equina syndrome: No Compression fracture: No Abdominal aneurysm: No  COGNITION: Overall cognitive status: Within functional limits for tasks assessed     SENSATION: 09/16/22: WFL  MUSCLE LENGTH: Hamstrings: no tightness noted; mild hypermobility noted  POSTURE: rounded shoulders and forward head  PALPATION: 09/16/22: unable to reproduce symptoms with palpation, no active trigger points noted  LUMBAR ROM:   AROM eval  Flexion WNL  Extension Limited 25%  Right Quadrant WNL with soreness  Left Quadrant WNL with Rt side soreness   (Blank rows = not tested)  LOWER EXTREMITY MMT:    MMT Right eval Left eval  Hip flexion 3/5 4/5  Hip extension 3/5 3/5  Hip abduction 3/5 3/5  Knee flexion 4/5 4/5  Knee extension 5/5 5/5   (Blank rows = not tested)  LUMBAR SPECIAL TESTS:  09/16/22: Straight leg raise test: Negative  GAIT: 09/16/22 Comments: independent with mild trendelenburg  noted  TODAY'S TREATMENT:                                                                                                                              DATE:  10/13/22 TherEx NuStep L6 x 8 min Discussed current exercise program and options to progress as well as variety of exercises.  Pt verbalized understanding.  Manual STM with compression to Rt QL and Rt glute med; skilled palpation and monitoring of soft tissue during DN  Trigger Point Dry-Needling  Treatment instructions: Expect mild to moderate muscle soreness. S/S of pneumothorax if dry needled over a lung field, and to seek immediate medical attention should they occur. Patient verbalized understanding of these instructions and education.  Patient Consent Given: Yes Education handout provided: Yes Muscles treated: Rt QL, Rt glute med Electrical stimulation performed: No Parameters: N/A Treatment response/outcome: twitch responses with decreased pain  10/06/22 TherEx NuStep L6 x 8 min Supine Rt isometric hip extension in 90/90 10 x 5 sec hold Bridge with strap 10 x 5 sec hold  Manual STM with compression to Rt QL and Rt glute med; skilled palpation and monitoring of soft tissue during DN  Trigger Point Dry-Needling  Treatment instructions: Expect mild to moderate muscle soreness. S/S of pneumothorax if dry needled over a lung field, and to seek immediate medical attention should they occur. Patient verbalized understanding of these instructions and education.  Patient Consent Given: Yes Education handout provided: Yes Muscles treated: Rt QL, Rt glute med Electrical stimulation performed: No Parameters: N/A Treatment response/outcome: twitch responses with decreased pain   09/29/22  TherEx NuStep L6 x 8 min Standing hip hike x 10 reps bil; 5 sec hold Supine pelvic tilt x10 reps; 5 sec hold Hooklying single limb clamshell x10 reps bil Quadruped hip extension x 10 reps bil Quadruped UE/LE x 5 reps each way (small  range) Prone opposite UE/LE extension x 5 reps bil Standing hip abduction with L3 band x 10 reps bil Standing hip extension with L3 band x 10 reps bil Standing rows L3 band with 5 sec hold; x10 reps Standing shoulder extension x 10 reps; 5 sec hold  09/16/22 See HEP -performed trial reps PRN for comprehension; mod cues for technique   PATIENT EDUCATION:  Education details: HEP Person educated: Patient Education method: Explanation, Demonstration, and Handouts Education comprehension: verbalized understanding, returned demonstration, and needs further education  HOME EXERCISE PROGRAM: Access Code: [email or web address] URL: [email or web address] Date: 09/29/2022 Prepared by: Faustino Congress  Exercises - Supine Posterior Pelvic Tilt  - 2 x daily - 7 x weekly - 1 sets - 10 reps - 5 sec hold - Supine Bridge  - 2 x daily - 7 x weekly - 1 sets - 10 reps - 5 sec hold - Hooklying Single Leg Bent Knee Fallouts with Resistance  - 2 x daily - 7 x weekly - 1 sets - 10 reps - Beginner Front Arm Support  - 2 x daily - 7 x weekly - 1 sets - 10 reps - 3 sec hold - Standing Hip Hiking  - 2 x daily - 7 x weekly - 1 sets - 10 reps - 5 sec  hold - Bird Dog  - 2 x daily - 7 x weekly - 1 sets - 10 reps - Prone Alternating Arm and Leg Lifts  - 1 x daily - 7 x weekly - 1 sets - 10 reps - Standing Hip Abduction with Resistance at Ankles and Counter Support  - 1 x daily - 7 x weekly - 1 sets - 10 reps - Standing Hip Extension with Resistance at Ankles and Unilateral Counter Support  - 1 x daily - 7 x weekly - 1 sets - 10 reps  ASSESSMENT:  CLINICAL IMPRESSION: Pt has met FOTO goal and overall demonstrating minimal pain at this time.  Anticipate she will be ready for d/c next 1-2 visits.   OBJECTIVE IMPAIRMENTS: Abnormal gait, decreased strength, postural dysfunction, and pain.   ACTIVITY LIMITATIONS: carrying, lifting, bending, standing, and locomotion level  PARTICIPATION LIMITATIONS: meal prep,  cleaning, laundry, shopping, and community activity  PERSONAL FACTORS: 1-2 comorbidities: HTN, Rt knee OA  are also affecting patient's functional outcome.   REHAB POTENTIAL: Good  CLINICAL DECISION MAKING: Evolving/moderate complexity  EVALUATION COMPLEXITY: Moderate   GOALS: Goals reviewed with patient? Yes  SHORT TERM GOALS: Target date: 10/07/2022  Independent with initial HEP Goal status: MET 09/29/22   LONG TERM GOALS: Target date: 10/28/2022  Independent with final HEP Goal status: INITIAL  2.  FOTO score improved to 55 Goal status: MET 10/13/22  3.  Bil hip strength improved to 4/5 for improved function Goal status: INIITAL  4.  Report pain < 3/10 with standing and carrying ~ 10#  for improved function Goal status: INITIAL   PLAN:  PT FREQUENCY: 1x/week  PT DURATION: 6 weeks  PLANNED INTERVENTIONS: Therapeutic exercises, Therapeutic activity, Neuromuscular re-education, Gait training, Patient/Family education, Self Care, Joint mobilization, Aquatic Therapy, Dry Needling, Electrical stimulation, Spinal manipulation, Spinal mobilization, Cryotherapy, Moist heat, Taping, Traction, Ultrasound, Ionotophoresis 4mg /ml Dexamethasone, Manual therapy, and Re-evaluation.  PLAN  FOR NEXT SESSION: begin checking goals, plan for d/c if still doing well.    Laureen Abrahams, PT, DPT 10/13/22 10:58 AM

## 2022-10-14 ENCOUNTER — Encounter: Payer: Self-pay | Admitting: Family Medicine

## 2022-10-18 DIAGNOSIS — H04123 Dry eye syndrome of bilateral lacrimal glands: Secondary | ICD-10-CM | POA: Diagnosis not present

## 2022-10-18 DIAGNOSIS — H04121 Dry eye syndrome of right lacrimal gland: Secondary | ICD-10-CM | POA: Diagnosis not present

## 2022-10-18 DIAGNOSIS — H04122 Dry eye syndrome of left lacrimal gland: Secondary | ICD-10-CM | POA: Diagnosis not present

## 2022-10-19 ENCOUNTER — Encounter: Payer: Self-pay | Admitting: Gastroenterology

## 2022-10-20 ENCOUNTER — Ambulatory Visit: Payer: Medicare PPO | Admitting: Physical Therapy

## 2022-10-20 ENCOUNTER — Encounter: Payer: Self-pay | Admitting: Physical Therapy

## 2022-10-20 DIAGNOSIS — R252 Cramp and spasm: Secondary | ICD-10-CM

## 2022-10-20 DIAGNOSIS — M5459 Other low back pain: Secondary | ICD-10-CM | POA: Diagnosis not present

## 2022-10-20 DIAGNOSIS — M6281 Muscle weakness (generalized): Secondary | ICD-10-CM | POA: Diagnosis not present

## 2022-10-20 NOTE — Therapy (Signed)
OUTPATIENT PHYSICAL THERAPY TREATMENT DISCHARGE SUMMARY   Patient Name: Melinda Ferguson MRN: [id number] DOB:11-28-1950, 72 y.o., female Today's Date: 10/20/2022  END OF SESSION:  PT End of Session - 10/20/22 1355     Visit Number 5    Number of Visits --    Date for PT Re-Evaluation 10/28/22    Authorization Type [employer]    Authorization Time Period 09/16/22-10/28/22    Authorization - Number of Visits 12    Progress Note Due on Visit 10    PT Start Time [id number]    PT Stop Time 1420    PT Time Calculation (min) 27 min    Activity Tolerance Patient tolerated treatment well    Behavior During Therapy [facility] for tasks assessed/performed                 Past Medical History:  Diagnosis Date   Allergy    Arthritis    Colon polyps    Hypertension    Past Surgical History:  Procedure Laterality Date   COLONOSCOPY     orthoscopy Left 1994   KNEE   TONSILLECTOMY     wisdom teeth extraction  1970   Patient Active Problem List   Diagnosis Date Noted   Lumbar spondylosis 04/19/2022   Osteoarthritis of right knee - sees Dr. Wynelle Link 02/19/2014   HTN (hypertension) 01/01/2013   Allergic rhinitis 01/01/2013    PCP: Loralyn Freshwater, MD  REFERRING PROVIDER: Lorine Bears, NP  REFERRING DIAG: M54.16 (ICD-10-CM) - Lumbar radiculopathy M47.26 (ICD-10-CM) - Other spondylosis with radiculopathy, lumbar region M47.816 (ICD-10-CM) - Facet arthropathy, lumbar M79.18 (ICD-10-CM) - Myofascial pain syndrome  Rationale for Evaluation and Treatment: Rehabilitation  THERAPY DIAG:  Other low back pain  Muscle weakness (generalized)  Cramp and spasm  ONSET DATE: 3-4 years ago  SUBJECTIVE:                                                                                                                                                                                           SUBJECTIVE STATEMENT: Doing well; pain has reduced significantly  PERTINENT HISTORY:  HTN, Rt knee  OA  PAIN:  Are you having pain? Yes: NPRS scale: 1 currently, up to 2/10 Pain location: low back, Rt > Lt, wraps around hip and occasionally into groin Pain description: aching, sore Aggravating factors: standing, lifting, carrying groceries Relieving factors: sitting, lying down, pelvic tilts, legs elevated  PRECAUTIONS: None  WEIGHT BEARING RESTRICTIONS: No  FALLS:  Has patient fallen in last 6 months? No  LIVING ENVIRONMENT: Lives with: lives with their spouse Lives in: House/apartment  OCCUPATION: Retired Multimedia programmer  for hearing impaired  PLOF: Independent and Leisure: reading, socialize, walking  PATIENT GOALS: improve pain, be able to move/function without fear of pain  NEXT MD VISIT: 09/23/22  OBJECTIVE:   DIAGNOSTIC FINDINGS:  MRI: There are multi level degenerative changes and multi level lateral recess narrowing  PATIENT SURVEYS:  09/16/22: FOTO 48 (predicted 55) 10/13/22: FOTO 63  SCREENING FOR RED FLAGS: Bowel or bladder incontinence: No Spinal tumors: No Cauda equina syndrome: No Compression fracture: No Abdominal aneurysm: No  COGNITION: Overall cognitive status: Within functional limits for tasks assessed     SENSATION: 09/16/22: WFL  MUSCLE LENGTH: Hamstrings: no tightness noted; mild hypermobility noted  POSTURE: rounded shoulders and forward head  PALPATION: 09/16/22: unable to reproduce symptoms with palpation, no active trigger points noted  LUMBAR ROM:   AROM eval  Flexion WNL  Extension Limited 25%  Right Quadrant WNL with soreness  Left Quadrant WNL with Rt side soreness   (Blank rows = not tested)  LOWER EXTREMITY MMT:    MMT Right eval Left eval Rt/Lt 10/20/22  Hip flexion 3/5 4/5 4/4  Hip extension 3/5 3/5 4/4  Hip abduction 3/5 3/5 4/4+  Knee flexion 4/5 4/5 5/5  Knee extension 5/5 5/5 5/5   (Blank rows = not tested)  LUMBAR SPECIAL TESTS:  09/16/22: Straight leg raise test:  Negative  GAIT: 09/16/22 Comments: independent with mild trendelenburg noted  TODAY'S TREATMENT:                                                                                                                              DATE:  10/19/22 Manual STM with compression and use of percussive device to Rt QL and Rt glute med  TherEx MMT - see above Discussed exercise progression and graded exposure to activities to decrease risk of a muscular strain as well as regular exercise throughout the year - pt verbalized understanding   10/13/22 TherEx NuStep L6 x 8 min Discussed current exercise program and options to progress as well as variety of exercises.  Pt verbalized understanding.  Manual STM with compression to Rt QL and Rt glute med; skilled palpation and monitoring of soft tissue during DN  Trigger Point Dry-Needling  Treatment instructions: Expect mild to moderate muscle soreness. S/S of pneumothorax if dry needled over a lung field, and to seek immediate medical attention should they occur. Patient verbalized understanding of these instructions and education.  Patient Consent Given: Yes Education handout provided: Yes Muscles treated: Rt QL, Rt glute med Electrical stimulation performed: No Parameters: N/A Treatment response/outcome: twitch responses with decreased pain  10/06/22 TherEx NuStep L6 x 8 min Supine Rt isometric hip extension in 90/90 10 x 5 sec hold Bridge with strap 10 x 5 sec hold  Manual STM with compression to Rt QL and Rt glute med; skilled palpation and monitoring of soft tissue during DN  Trigger Point Dry-Needling  Treatment instructions: Expect mild to moderate muscle soreness. S/S  of pneumothorax if dry needled over a lung field, and to seek immediate medical attention should they occur. Patient verbalized understanding of these instructions and education.  Patient Consent Given: Yes Education handout provided: Yes Muscles treated: Rt QL, Rt glute  med Electrical stimulation performed: No Parameters: N/A Treatment response/outcome: twitch responses with decreased pain   09/29/22 TherEx NuStep L6 x 8 min Standing hip hike x 10 reps bil; 5 sec hold Supine pelvic tilt x10 reps; 5 sec hold Hooklying single limb clamshell x10 reps bil Quadruped hip extension x 10 reps bil Quadruped UE/LE x 5 reps each way (small range) Prone opposite UE/LE extension x 5 reps bil Standing hip abduction with L3 band x 10 reps bil Standing hip extension with L3 band x 10 reps bil Standing rows L3 band with 5 sec hold; x10 reps Standing shoulder extension x 10 reps; 5 sec hold  09/16/22 See HEP -performed trial reps PRN for comprehension; mod cues for technique   PATIENT EDUCATION:  Education details: HEP Person educated: Patient Education method: Explanation, Demonstration, and Handouts Education comprehension: verbalized understanding, returned demonstration, and needs further education  HOME EXERCISE PROGRAM: Access Code: [email or web address] URL: [email or web address] Date: 09/29/2022 Prepared by: Faustino Congress  Exercises - Supine Posterior Pelvic Tilt  - 2 x daily - 7 x weekly - 1 sets - 10 reps - 5 sec hold - Supine Bridge  - 2 x daily - 7 x weekly - 1 sets - 10 reps - 5 sec hold - Hooklying Single Leg Bent Knee Fallouts with Resistance  - 2 x daily - 7 x weekly - 1 sets - 10 reps - Beginner Front Arm Support  - 2 x daily - 7 x weekly - 1 sets - 10 reps - 3 sec hold - Standing Hip Hiking  - 2 x daily - 7 x weekly - 1 sets - 10 reps - 5 sec  hold - Bird Dog  - 2 x daily - 7 x weekly - 1 sets - 10 reps - Prone Alternating Arm and Leg Lifts  - 1 x daily - 7 x weekly - 1 sets - 10 reps - Standing Hip Abduction with Resistance at Ankles and Counter Support  - 1 x daily - 7 x weekly - 1 sets - 10 reps - Standing Hip Extension with Resistance at Ankles and Unilateral Counter Support  - 1 x daily - 7 x weekly - 1 sets - 10  reps  ASSESSMENT:  CLINICAL IMPRESSION: Pt has met all goals and is ready for transition to home program.  Will d/c PT today.  OBJECTIVE IMPAIRMENTS: Abnormal gait, decreased strength, postural dysfunction, and pain.   ACTIVITY LIMITATIONS: carrying, lifting, bending, standing, and locomotion level  PARTICIPATION LIMITATIONS: meal prep, cleaning, laundry, shopping, and community activity  PERSONAL FACTORS: 1-2 comorbidities: HTN, Rt knee OA  are also affecting patient's functional outcome.   REHAB POTENTIAL: Good  CLINICAL DECISION MAKING: Evolving/moderate complexity  EVALUATION COMPLEXITY: Moderate   GOALS: Goals reviewed with patient? Yes  SHORT TERM GOALS: Target date: 10/07/2022  Independent with initial HEP Goal status: MET 09/29/22   LONG TERM GOALS: Target date: 10/28/2022  Independent with final HEP Goal status: MET 10/20/22  2.  FOTO score improved to 55 Goal status: MET 10/13/22  3.  Bil hip strength improved to 4/5 for improved function Goal status: MET 10/20/22  4.  Report pain < 3/10 with standing and carrying ~ 10#  for improved function Goal status: MET 10/20/22  PLAN:  PT FREQUENCY: 1x/week  PT DURATION: 6 weeks  PLANNED INTERVENTIONS: Therapeutic exercises, Therapeutic activity, Neuromuscular re-education, Gait training, Patient/Family education, Self Care, Joint mobilization, Aquatic Therapy, Dry Needling, Electrical stimulation, Spinal manipulation, Spinal mobilization, Cryotherapy, Moist heat, Taping, Traction, Ultrasound, Ionotophoresis 4mg /ml Dexamethasone, Manual therapy, and Re-evaluation.  PLAN FOR NEXT SESSION: d/c PT   Laureen Abrahams, PT, DPT 10/20/22 2:23 PM   PHYSICAL THERAPY DISCHARGE SUMMARY  Visits from Start of Care: 5  Current functional level related to goals / functional outcomes: See above   Remaining deficits: See above   Education / Equipment: HEP, DN   Patient agrees to discharge. Patient goals were met.  Patient is being discharged due to meeting the stated rehab goals.

## 2022-10-27 ENCOUNTER — Encounter: Payer: Medicare PPO | Admitting: Physical Therapy

## 2022-11-05 ENCOUNTER — Telehealth: Payer: Self-pay | Admitting: Physical Medicine and Rehabilitation

## 2022-11-05 NOTE — Telephone Encounter (Signed)
Patient called. She would like an appointment with Dr. Alvester Morin. Her call back number is [phone]

## 2022-11-08 ENCOUNTER — Other Ambulatory Visit: Payer: Self-pay | Admitting: Physical Medicine and Rehabilitation

## 2022-11-08 DIAGNOSIS — M5412 Radiculopathy, cervical region: Secondary | ICD-10-CM

## 2022-11-08 NOTE — Telephone Encounter (Signed)
Spoke with patient and she is requesting a neck injection. It is the same type of pain as before. Last injection was 11/23/21 and lasted until recently with 70% r better relief. No new falls, accidents or injuries. Please advise

## 2022-11-16 DIAGNOSIS — H04123 Dry eye syndrome of bilateral lacrimal glands: Secondary | ICD-10-CM | POA: Diagnosis not present

## 2022-11-18 DIAGNOSIS — M170 Bilateral primary osteoarthritis of knee: Secondary | ICD-10-CM | POA: Diagnosis not present

## 2022-11-18 DIAGNOSIS — M25561 Pain in right knee: Secondary | ICD-10-CM | POA: Insufficient documentation

## 2022-11-23 ENCOUNTER — Other Ambulatory Visit: Payer: Self-pay

## 2022-11-23 ENCOUNTER — Ambulatory Visit: Payer: Medicare PPO | Admitting: Physical Medicine and Rehabilitation

## 2022-11-23 VITALS — BP 162/91 | HR 76

## 2022-11-23 DIAGNOSIS — M5412 Radiculopathy, cervical region: Secondary | ICD-10-CM | POA: Diagnosis not present

## 2022-11-23 MED ORDER — METHYLPREDNISOLONE ACETATE 80 MG/ML IJ SUSP
80.0000 mg | Freq: Once | INTRAMUSCULAR | Status: AC
  Administered 2022-11-23: 80 mg

## 2022-11-23 NOTE — Patient Instructions (Signed)

## 2022-11-23 NOTE — Progress Notes (Signed)
Functional Pain Scale - descriptive words and definitions  Uncomfortable (3)  Pain is present but can complete all ADL's/sleep is slightly affected and passive distraction only gives marginal relief. Mild range order  Average Pain 0   +Driver, -BT, -Dye Allergies.  Neck pain on left side

## 2022-11-30 DIAGNOSIS — L309 Dermatitis, unspecified: Secondary | ICD-10-CM | POA: Diagnosis not present

## 2022-11-30 NOTE — Progress Notes (Signed)
Melinda Ferguson - 72 y.o. female MRN [id number]  Date of birth: [birth year]  Office Visit Note: Visit Date: 11/23/2022 PCP: Karie Georges, MD Referred by: Karie Georges, MD  Subjective: Chief Complaint  Patient presents with   Neck - Pain   HPI:  [name] is a 72 y.o. female who comes in today for planned repeat Left C7-T1  Cervical Interlaminar epidural steroid injection with fluoroscopic guidance.  The patient has failed conservative care including home exercise, medications, time and activity modification.  This injection will be diagnostic and hopefully therapeutic.  Please see requesting physician notes for further details and justification. Patient received more than 50% pain relief from prior injection.   Referring: Ellin Goodie, FNP and Dr. Doneen Poisson   ROS Otherwise per HPI.  Assessment & Plan: Visit Diagnoses:    ICD-10-CM   1. Cervical radiculopathy  M54.12 XR C-ARM NO REPORT    Epidural Steroid injection    methylPREDNISolone acetate (DEPO-MEDROL) injection 80 mg      Plan: No additional findings.   Meds & Orders:  Meds ordered this encounter  Medications   methylPREDNISolone acetate (DEPO-MEDROL) injection 80 mg    Orders Placed This Encounter  Procedures   XR C-ARM NO REPORT   Epidural Steroid injection    Follow-up: Return for visit to requesting provider as needed.   Procedures: No procedures performed  Cervical Epidural Steroid Injection - Interlaminar Approach with Fluoroscopic Guidance  Patient: Melinda Ferguson      Date of Birth: Jan 24, 1951 MRN: [id number] PCP: Karie Georges, MD      Visit Date: 11/23/2022   Universal Protocol:    Date/Time: 05/14/245:08 PM  Consent Given By: the patient  Position: PRONE  Additional Comments: Vital signs were monitored before and after the procedure. Patient was prepped and draped in the usual sterile fashion. The correct patient, procedure, and site was  verified.   Injection Procedure Details:   Procedure diagnoses: Cervical radiculopathy [M54.12]    Meds Administered:  Meds ordered this encounter  Medications   methylPREDNISolone acetate (DEPO-MEDROL) injection 80 mg     Laterality: Left  Location/Site: C7-T1  Needle: 3.5 in., 20 ga. Tuohy  Needle Placement: Paramedian epidural space  Findings:  -Comments: Excellent flow of contrast into the epidural space.  Procedure Details: Using a paramedian approach from the side mentioned above, the region overlying the inferior lamina was localized under fluoroscopic visualization and the soft tissues overlying this structure were infiltrated with 4 ml. of 1% Lidocaine without Epinephrine. A # 20 gauge, Tuohy needle was inserted into the epidural space using a paramedian approach.  The epidural space was localized using loss of resistance along with contralateral oblique bi-planar fluoroscopic views.  After negative aspirate for air, blood, and CSF, a 2 ml. volume of Isovue-250 was injected into the epidural space and the flow of contrast was observed. Radiographs were obtained for documentation purposes.   The injectate was administered into the level noted above.  Additional Comments:  No complications occurred Dressing: 2 x 2 sterile gauze and Band-Aid    Post-procedure details: Patient was observed during the procedure. Post-procedure instructions were reviewed.  Patient left the clinic in stable condition.   Clinical History: Narrative & Impression CLINICAL DATA:  Low back pain for 5 years.   EXAM: MRI LUMBAR SPINE WITHOUT CONTRAST   TECHNIQUE: Multiplanar, multisequence MR imaging of the lumbar spine was performed. No intravenous contrast was administered.   COMPARISON:  Lumbar spine  radiographs 07/27/2021   FINDINGS: Segmentation: There are five lumbar type vertebral bodies. The last full intervertebral disc space is labeled L5-S1. This correlates with the  radiographs.   Alignment: Degenerative lumbar spondylosis with mild multilevel degenerative listhesis but normal overall alignment.   Vertebrae:  Normal marrow signal.  No bone lesions or fractures.   Conus medullaris and cauda equina: Conus extends to the L1-2 level. Conus and cauda equina appear normal.   Paraspinal and other soft tissues: Small simple parenchymal and parapelvic renal cysts not requiring any further imaging evaluation or follow-up.   Disc levels:   T12-L1: Mild bulging annulus and mild facet disease with mild bilateral lateral recess encroachment but no spinal foraminal stenosis.   L1-2: Degenerative retrolisthesis of L1 compared L2. Degenerative disc disease with a bulging annulus and osteophytic ridging. There is also mild facet disease and ligamentum flavum thickening but no significant spinal stenosis. Mild bilateral lateral recess stenosis. No significant foraminal stenosis.   L2-3: Bulging annulus, osteophytic ridging and facet disease contributing to early spinal stenosis and mild bilateral lateral recess stenosis. No significant foraminal stenosis.   L3-4: Bulging degenerated annulus, moderate facet disease and ligamentum flavum thickening contributing to mild/early spinal stenosis, mild right lateral recess stenosis and moderate left lateral recess stenosis. No significant foraminal stenosis. Mild left foraminal encroachment.   L4-5: Bulging annulus, osteophytic ridging, facet disease and ligamentum flavum thickening but no significant spinal stenosis. Moderate bilateral lateral recess stenosis, left greater than right and mild bilateral foraminal stenosis, left greater than right.   L5-S1: Advanced facet disease and bulging degenerated annulus but no significant spinal or lateral recess stenosis. Mild right foraminal stenosis.   IMPRESSION: 1. Degenerative lumbar spondylosis with multilevel disc disease and facet disease. 2. Mild bilateral  lateral recess stenosis at L1-2. 3. Early spinal stenosis and mild bilateral lateral recess stenosis at L2-3. 4. Mild/early spinal stenosis, mild right lateral recess stenosis and moderate left lateral recess stenosis at L3-4. Mild left foraminal encroachment. 5. Moderate bilateral lateral recess stenosis, left greater than right and mild bilateral foraminal stenosis, left greater than right at L4-5. 6. Mild right foraminal stenosis at L5-S1.     Electronically Signed   By: Rudie Meyer M.D.   On: 09/12/2022 10:34     Objective:  VS:  HT:    WT:   BMI:     BP:(!) 162/91  HR:76bpm  TEMP: ( )  RESP:  Physical Exam Vitals and nursing note reviewed.  Constitutional:      General: She is not in acute distress.    Appearance: Normal appearance. She is not ill-appearing.  HENT:     Head: Normocephalic and atraumatic.     Right Ear: External ear normal.     Left Ear: External ear normal.  Eyes:     Extraocular Movements: Extraocular movements intact.  Cardiovascular:     Rate and Rhythm: Normal rate.     Pulses: Normal pulses.  Musculoskeletal:     Cervical back: Tenderness present. No rigidity.     Right lower leg: No edema.     Left lower leg: No edema.     Comments: Patient has good strength in the upper extremities including 5 out of 5 strength in wrist extension long finger flexion and APB.  There is no atrophy of the hands intrinsically.  There is a negative Hoffmann's test.   Lymphadenopathy:     Cervical: No cervical adenopathy.  Skin:    Findings: No erythema, lesion or  rash.  Neurological:     General: No focal deficit present.     Mental Status: She is alert and oriented to person, place, and time.     Sensory: No sensory deficit.     Motor: No weakness or abnormal muscle tone.     Coordination: Coordination normal.  Psychiatric:        Mood and Affect: Mood normal.        Behavior: Behavior normal.      Imaging: No results found.

## 2022-11-30 NOTE — Procedures (Signed)
Cervical Epidural Steroid Injection - Interlaminar Approach with Fluoroscopic Guidance  Patient: Melinda Ferguson      Date of Birth: 08/26/1950 MRN: [id number] PCP: Karie Georges, MD      Visit Date: 11/23/2022   Universal Protocol:    Date/Time: 05/14/245:08 PM  Consent Given By: the patient  Position: PRONE  Additional Comments: Vital signs were monitored before and after the procedure. Patient was prepped and draped in the usual sterile fashion. The correct patient, procedure, and site was verified.   Injection Procedure Details:   Procedure diagnoses: Cervical radiculopathy [M54.12]    Meds Administered:  Meds ordered this encounter  Medications   methylPREDNISolone acetate (DEPO-MEDROL) injection 80 mg     Laterality: Left  Location/Site: C7-T1  Needle: 3.5 in., 20 ga. Tuohy  Needle Placement: Paramedian epidural space  Findings:  -Comments: Excellent flow of contrast into the epidural space.  Procedure Details: Using a paramedian approach from the side mentioned above, the region overlying the inferior lamina was localized under fluoroscopic visualization and the soft tissues overlying this structure were infiltrated with 4 ml. of 1% Lidocaine without Epinephrine. A # 20 gauge, Tuohy needle was inserted into the epidural space using a paramedian approach.  The epidural space was localized using loss of resistance along with contralateral oblique bi-planar fluoroscopic views.  After negative aspirate for air, blood, and CSF, a 2 ml. volume of Isovue-250 was injected into the epidural space and the flow of contrast was observed. Radiographs were obtained for documentation purposes.   The injectate was administered into the level noted above.  Additional Comments:  No complications occurred Dressing: 2 x 2 sterile gauze and Band-Aid    Post-procedure details: Patient was observed during the procedure. Post-procedure instructions were  reviewed.  Patient left the clinic in stable condition.

## 2022-12-23 ENCOUNTER — Ambulatory Visit: Payer: Medicare PPO | Admitting: Internal Medicine

## 2022-12-27 ENCOUNTER — Telehealth: Payer: Self-pay

## 2022-12-27 ENCOUNTER — Ambulatory Visit (AMBULATORY_SURGERY_CENTER): Payer: Medicare PPO

## 2022-12-27 VITALS — Ht 63.75 in | Wt 180.0 lb

## 2022-12-27 DIAGNOSIS — Z86010 Personal history of colonic polyps: Secondary | ICD-10-CM

## 2022-12-27 MED ORDER — NA SULFATE-K SULFATE-MG SULF 17.5-3.13-1.6 GM/177ML PO SOLN: 1.0000 | Freq: Once | ORAL | 0 refills | Status: AC

## 2022-12-27 NOTE — Telephone Encounter (Signed)
PV complete

## 2022-12-27 NOTE — Progress Notes (Signed)
No egg or soy allergy known to patient  No issues known to pt with past sedation with any surgeries or procedures Patient denies ever being told they had issues or difficulty with intubation  No FH of Malignant Hyperthermia Pt is not on diet pills Pt is not on  home 02  Pt is not on blood thinners  Pt denies issues with constipation  No A fib or A flutter Have any cardiac testing pending--no  Pt is ambulatory    PV complete. Prep instructions reviewed with patient. Instructions provided via MyChart and home address. Goodrx coupon for [employer] provided. Pt instructed to use Single[employer]com or GoodRx for a price reduction on prep  if needed.  Patient's chart reviewed by Cathlyn Parsons CNRA prior to previsit and patient appropriate for the [facility].  Previsit completed and red dot placed by patient's name on their procedure day (on provider's schedule).

## 2022-12-28 DIAGNOSIS — M1711 Unilateral primary osteoarthritis, right knee: Secondary | ICD-10-CM | POA: Diagnosis not present

## 2023-01-04 ENCOUNTER — Encounter: Payer: Self-pay | Admitting: Gastroenterology

## 2023-01-04 DIAGNOSIS — M1711 Unilateral primary osteoarthritis, right knee: Secondary | ICD-10-CM | POA: Diagnosis not present

## 2023-01-05 ENCOUNTER — Encounter: Payer: Self-pay | Admitting: Family Medicine

## 2023-01-05 ENCOUNTER — Ambulatory Visit: Payer: Medicare PPO | Admitting: Family Medicine

## 2023-01-11 DIAGNOSIS — M1711 Unilateral primary osteoarthritis, right knee: Secondary | ICD-10-CM | POA: Diagnosis not present

## 2023-01-24 ENCOUNTER — Encounter: Payer: Self-pay | Admitting: Gastroenterology

## 2023-01-24 ENCOUNTER — Ambulatory Visit (AMBULATORY_SURGERY_CENTER): Payer: Medicare PPO | Admitting: Gastroenterology

## 2023-01-24 VITALS — BP 101/60 | HR 67 | Temp 97.8°F | Resp 13 | Ht 63.75 in | Wt 180.0 lb

## 2023-01-24 DIAGNOSIS — Z09 Encounter for follow-up examination after completed treatment for conditions other than malignant neoplasm: Secondary | ICD-10-CM

## 2023-01-24 DIAGNOSIS — Z1211 Encounter for screening for malignant neoplasm of colon: Secondary | ICD-10-CM | POA: Diagnosis not present

## 2023-01-24 DIAGNOSIS — D124 Benign neoplasm of descending colon: Secondary | ICD-10-CM

## 2023-01-24 DIAGNOSIS — Z86010 Personal history of colonic polyps: Secondary | ICD-10-CM

## 2023-01-24 DIAGNOSIS — K635 Polyp of colon: Secondary | ICD-10-CM | POA: Diagnosis not present

## 2023-01-24 DIAGNOSIS — I10 Essential (primary) hypertension: Secondary | ICD-10-CM | POA: Diagnosis not present

## 2023-01-24 MED ORDER — SODIUM CHLORIDE 0.9 % IV SOLN: 500.0000 mL | Freq: Once | INTRAVENOUS | Status: DC

## 2023-01-24 NOTE — Patient Instructions (Signed)
Handouts provided on polyps, diverticulosis and high fiber diet.  Recommend high fiber diet (see handout).  Continue present medications.  Await pathology results.  Repeat colonoscopy after studies are complete for surveillance based on pathology results.   YOU HAD AN ENDOSCOPIC PROCEDURE TODAY AT THE [facility]:   Refer to the procedure report that was given to you for any specific questions about what was found during the examination.  If the procedure report does not answer your questions, please call your gastroenterologist to clarify.  If you requested that your care partner not be given the details of your procedure findings, then the procedure report has been included in a sealed envelope for you to review at your convenience later.  YOU SHOULD EXPECT: Some feelings of bloating in the abdomen. Passage of more gas than usual.  Walking can help get rid of the air that was put into your GI tract during the procedure and reduce the bloating. If you had a lower endoscopy (such as a colonoscopy or flexible sigmoidoscopy) you may notice spotting of blood in your stool or on the toilet paper. If you underwent a bowel prep for your procedure, you may not have a normal bowel movement for a few days.  Please Note:  You might notice some irritation and congestion in your nose or some drainage.  This is from the oxygen used during your procedure.  There is no need for concern and it should clear up in a day or so.  SYMPTOMS TO REPORT IMMEDIATELY:  Following lower endoscopy (colonoscopy or flexible sigmoidoscopy):  Excessive amounts of blood in the stool  Significant tenderness or worsening of abdominal pains  Swelling of the abdomen that is new, acute  Fever of 100F or higher  For urgent or emergent issues, a gastroenterologist can be reached at any hour by calling [phone]. Do not use MyChart messaging for urgent concerns.    DIET:  We do recommend a small meal at first, but  then you may proceed to your regular diet.  Drink plenty of fluids but you should avoid alcoholic beverages for 24 hours.  ACTIVITY:  You should plan to take it easy for the rest of today and you should NOT DRIVE or use heavy machinery until tomorrow (because of the sedation medicines used during the test).    FOLLOW UP: Our staff will call the number listed on your records the next business day following your procedure.  We will call around 7:15- 8:00 am to check on you and address any questions or concerns that you may have regarding the information given to you following your procedure. If we do not reach you, we will leave a message.     If any biopsies were taken you will be contacted by phone or by letter within the next 1-3 weeks.  Please call us at [phone] if you have not heard about the biopsies in 3 weeks.    SIGNATURES/CONFIDENTIALITY: You and/or your care partner have signed paperwork which will be entered into your electronic medical record.  These signatures attest to the fact that that the information above on your After Visit Summary has been reviewed and is understood.  Full responsibility of the confidentiality of this discharge information lies with you and/or your care-partner.

## 2023-01-24 NOTE — Op Note (Signed)
[facility] Patient Name: Keiyanna Markson Procedure Date: 01/24/2023 7:53 AM MRN: [id number] Endoscopist: Meryl Dare , MD, [phone] Age: 72 Referring MD:  Date of Birth: [birth year] Gender: Female Account #: [id number] Procedure:                Colonoscopy Indications:              Screening for colorectal malignant neoplasm Medicines:                Monitored Anesthesia Care Procedure:                Pre-Anesthesia Assessment:                           - Prior to the procedure, a History and Physical                            was performed, and patient medications and                            allergies were reviewed. The patient's tolerance of                            previous anesthesia was also reviewed. The risks                            and benefits of the procedure and the sedation                            options and risks were discussed with the patient.                            All questions were answered, and informed consent                            was obtained. Prior Anticoagulants: The patient has                            taken no anticoagulant or antiplatelet agents. ASA                            Grade Assessment: II - A patient with mild systemic                            disease. After reviewing the risks and benefits,                            the patient was deemed in satisfactory condition to                            undergo the procedure.                           After obtaining informed consent, the colonoscope  was passed under direct vision. Throughout the                            procedure, the patient's blood pressure, pulse, and                            oxygen saturations were monitored continuously. The                            Olympus Scope SN: [id number] was introduced through                            the anus and advanced to the the cecum, identified                            by  appendiceal orifice and ileocecal valve. The                            ileocecal valve, appendiceal orifice, and rectum                            were photographed. The quality of the bowel                            preparation was excellent. The colonoscopy was                            performed without difficulty. The patient tolerated                            the procedure well. Scope In: 7:57:28 AM Scope Out: 8:11:58 AM Scope Withdrawal Time: 0 hours 11 minutes 6 seconds  Total Procedure Duration: 0 hours 14 minutes 30 seconds  Findings:                 The perianal and digital rectal examinations were                            normal.                           A 6 mm polyp was found in the descending colon. The                            polyp was sessile. The polyp was removed with a                            cold snare. Resection and retrieval were complete.                           Multiple small-mouthed diverticula were found in                            the left colon. Peri-diverticular erythema was seen.  The exam was otherwise without abnormality on                            direct and retroflexion views. Complications:            No immediate complications. Estimated blood loss:                            None. Estimated Blood Loss:     Estimated blood loss: none. Impression:               - One 6 mm polyp in the descending colon, removed                            with a cold snare. Resected and retrieved.                           - Mild diverticulosis in the left colon.                           - The examination was otherwise normal on direct                            and retroflexion views. Recommendation:           - Repeat colonoscopy after studies are complete for                            surveillance based on pathology results.                           - Patient has a contact number available for                             emergencies. The signs and symptoms of potential                            delayed complications were discussed with the                            patient. Return to normal activities tomorrow.                            Written discharge instructions were provided to the                            patient.                           - High fiber diet.                           - Continue present medications.                           - Await pathology results. Meryl Dare, MD 01/24/2023 8:15:11 AM This report has been  signed electronically.

## 2023-01-24 NOTE — Progress Notes (Signed)
Pt resting comfortably. VSS. Airway intact. SBAR complete to RN. All questions answered.   

## 2023-01-24 NOTE — Progress Notes (Signed)
Called to room to assist during endoscopic procedure.  Patient ID and intended procedure confirmed with present staff. Received instructions for my participation in the procedure from the performing physician.  

## 2023-01-24 NOTE — Progress Notes (Signed)
History & Physical  Primary Care Physician:  Karie Georges, MD Primary Gastroenterologist: Claudette Head, MD  Impression / Plan:  Personal history of adenomatous colon polyps for surveillance colonoscopy  CHIEF COMPLAINT:  Personal history of colon polyps   HPI: Melinda Ferguson is a 72 y.o. female with a personal history of adenomatous colon polyps for surveillance colonoscopy.    Past Medical History:  Diagnosis Date   Allergy    Arthritis    Colon polyps    Hypertension     Past Surgical History:  Procedure Laterality Date   COLONOSCOPY     orthoscopy Left 1994   KNEE   TONSILLECTOMY     wisdom teeth extraction  1970    Prior to Admission medications   Medication Sig Start Date End Date Taking? Authorizing Provider  Azelastine HCl 0.15 % SOLN 2 actuations in each nostril BID 10/13/22  Yes Karie Georges, MD  diphenhydramine-acetaminophen (TYLENOL PM) 25-500 MG TABS tablet Take 1 tablet by mouth at bedtime.   Yes [provider]  fexofenadine (ALLEGRA) 60 MG tablet Take 60 mg by mouth daily.   Yes [provider]  fluticasone (FLONASE) 50 MCG/ACT nasal spray Place 2 sprays into both nostrils daily. 10/13/22  Yes Karie Georges, MD  gabapentin (NEURONTIN) 100 MG capsule TAKE 1-3 CAPSULES AT BEDTIME 08/19/22  Yes Karie Georges, MD  losartan-hydrochlorothiazide [facility]) 100-12.5 MG tablet Take 1 tablet by mouth daily. 04/19/22  Yes Karie Georges, MD  psyllium (METAMUCIL) 58.6 % powder Take 1 packet by mouth daily.   Yes [provider]  Emollient (COLLAGEN EX) Apply topically. Patient not taking: Reported on 12/27/2022    [provider]  meclizine (ANTIVERT) 12.5 MG tablet Take 1 tablet (12.5 mg total) by mouth 3 (three) times daily as needed for dizziness. Patient not taking: Reported on 01/24/2023 05/03/22   Dulce Sellar, NP  ondansetron (ZOFRAN) 4 MG tablet Take 1 tablet (4 mg total) by mouth every 8 (eight)  hours as needed for nausea or vomiting. Patient not taking: Reported on 12/27/2022 05/03/22   Dulce Sellar, NP    Current Outpatient Medications  Medication Sig Dispense Refill   Azelastine HCl 0.15 % SOLN 2 actuations in each nostril BID 30 mL 5   diphenhydramine-acetaminophen (TYLENOL PM) 25-500 MG TABS tablet Take 1 tablet by mouth at bedtime.     fexofenadine (ALLEGRA) 60 MG tablet Take 60 mg by mouth daily.     fluticasone (FLONASE) 50 MCG/ACT nasal spray Place 2 sprays into both nostrils daily. 16 g 5   gabapentin (NEURONTIN) 100 MG capsule TAKE 1-3 CAPSULES AT BEDTIME 270 capsule 1   losartan-hydrochlorothiazide (HYZAAR) 100-12.5 MG tablet Take 1 tablet by mouth daily. 90 tablet 3   psyllium (METAMUCIL) 58.6 % powder Take 1 packet by mouth daily.     Emollient (COLLAGEN EX) Apply topically. (Patient not taking: Reported on 12/27/2022)     meclizine (ANTIVERT) 12.5 MG tablet Take 1 tablet (12.5 mg total) by mouth 3 (three) times daily as needed for dizziness. (Patient not taking: Reported on 01/24/2023) 30 tablet 0   ondansetron (ZOFRAN) 4 MG tablet Take 1 tablet (4 mg total) by mouth every 8 (eight) hours as needed for nausea or vomiting. (Patient not taking: Reported on 12/27/2022) 20 tablet 0   Current Facility-Administered Medications  Medication Dose Route Frequency Provider Last Rate Last Admin   0.9 %  sodium chloride infusion  500 mL Intravenous Once Claudette Head  T, MD        Allergies as of 01/24/2023   (No Known Allergies)    Family History  Problem Relation Age of Onset   Alzheimer's disease Mother 52       passed at 28   Diverticulosis Mother    Heart disease Father 52       CAD, bypass   Migraines Sister    Colon cancer Maternal Grandfather 26   Esophageal cancer Neg Hx    Stomach cancer Neg Hx    Rectal cancer Neg Hx    Breast cancer Neg Hx     Social History   Socioeconomic History   Marital status: Married    Spouse name: Not on file   Number of  children: Not on file   Years of education: Not on file   Highest education level: Not on file  Occupational History   Occupation: language facilitator    Comment: retired  Tobacco Use   Smoking status: Former    Types: Cigarettes    Quit date: 01/04/1979    Years since quitting: 44.0   Smokeless tobacco: Never  Vaping Use   Vaping Use: Never used  Substance and Sexual Activity   Alcohol use: Yes    Alcohol/week: 7.0 standard drinks of alcohol    Types: 7 Glasses of wine per week   Drug use: No   Sexual activity: Yes  Other Topics Concern   Not on file  Social History Narrative   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      Home Situation: lives with husband      Spiritual Beliefs: none      Lifestyle: walks about 1 hour daily; no diet plan            Social Determinants of Health   Financial Resource Strain: Low Risk  (06/02/2022)   Overall Financial Resource Strain (CARDIA)    Difficulty of Paying Living Expenses: Not hard at all  Food Insecurity: No Food Insecurity (06/02/2022)   Hunger Vital Sign    Worried About Running Out of Food in the Last Year: Never true    Ran Out of Food in the Last Year: Never true  Transportation Needs: No Transportation Needs (06/02/2022)   PRAPARE - Administrator, Civil Service (Medical): No    Lack of Transportation (Non-Medical): No  Physical Activity: Insufficiently Active (06/02/2022)   Exercise Vital Sign    Days of Exercise per Week: 3 days    Minutes of Exercise per Session: 30 min  Stress: No Stress Concern Present (06/02/2022)   [employer] of Occupational Health - Occupational Stress Questionnaire    Feeling of Stress : Not at all  Social Connections: Moderately Isolated (06/02/2022)   Social Connection and Isolation Panel [NHANES]    Frequency of Communication with Friends and Family: More than three times a week    Frequency of Social Gatherings with Friends and  Family: More than three times a week    Attends Religious Services: Never    Database administrator or Organizations: No    Attends Banker Meetings: Never    Marital Status: Married  Catering manager Violence: Not At Risk (06/02/2022)   Humiliation, Afraid, Rape, and Kick questionnaire    Fear of Current or Ex-Partner: No    Emotionally Abused: No    Physically Abused: No    Sexually Abused: No    Review of Systems:  All  systems reviewed were negative except where noted in HPI.   Physical Exam:  General:  Alert, well-developed, in NAD Head:  Normocephalic and atraumatic. Eyes:  Sclera clear, no icterus.   Conjunctiva pink. Ears:  Normal auditory acuity. Mouth:  No deformity or lesions.  Neck:  Supple; no masses. Lungs:  Clear throughout to auscultation.   No wheezes, crackles, or rhonchi.  Heart:  Regular rate and rhythm; no murmurs. Abdomen:  Soft, nondistended, nontender. No masses, hepatomegaly. No palpable masses.  Normal bowel sounds.    Rectal:  Deferred   Msk:  Symmetrical without gross deformities. Extremities:  Without edema. Neurologic:  Alert and  oriented x 4; grossly normal neurologically. Skin:  Intact without significant lesions or rashes. Psych:  Alert and cooperative. Normal mood and affect.   Venita Lick. Russella Dar  01/24/2023, 7:47 AM See Loretha Stapler, [facility], to contact our on call provider

## 2023-01-25 ENCOUNTER — Telehealth: Payer: Self-pay

## 2023-01-25 NOTE — Telephone Encounter (Signed)
  Follow up Call-     01/24/2023    7:31 AM  Call back number  Post procedure Call Back phone  # [phone]  Permission to leave phone message Yes     Patient questions:  Do you have a fever, pain , or abdominal swelling? No. Pain Score  0 *  Have you tolerated food without any problems? Yes.    Have you been able to return to your normal activities? Yes.    Do you have any questions about your discharge instructions: Diet   No. Medications  No. Follow up visit  No.  Do you have questions or concerns about your Care? No.  Actions: * If pain score is 4 or above: No action needed, pain <4.

## 2023-02-07 ENCOUNTER — Encounter: Payer: Self-pay | Admitting: Gastroenterology

## 2023-02-10 ENCOUNTER — Ambulatory Visit
Admission: RE | Admit: 2023-02-10 | Discharge: 2023-02-10 | Disposition: A | Discharge status: Home / Self Care | Payer: Medicare PPO | Source: Ambulatory Visit | Attending: Family Medicine | Admitting: Family Medicine

## 2023-02-10 DIAGNOSIS — M8588 Other specified disorders of bone density and structure, other site: Secondary | ICD-10-CM | POA: Diagnosis not present

## 2023-02-10 DIAGNOSIS — Z780 Asymptomatic menopausal state: Secondary | ICD-10-CM

## 2023-02-10 DIAGNOSIS — N958 Other specified menopausal and perimenopausal disorders: Secondary | ICD-10-CM | POA: Diagnosis not present

## 2023-02-10 DIAGNOSIS — E349 Endocrine disorder, unspecified: Secondary | ICD-10-CM | POA: Diagnosis not present

## 2023-02-14 DIAGNOSIS — M65332 Trigger finger, left middle finger: Secondary | ICD-10-CM | POA: Diagnosis not present

## 2023-02-14 DIAGNOSIS — M65341 Trigger finger, right ring finger: Secondary | ICD-10-CM | POA: Diagnosis not present

## 2023-02-14 DIAGNOSIS — M65331 Trigger finger, right middle finger: Secondary | ICD-10-CM | POA: Diagnosis not present

## 2023-02-14 DIAGNOSIS — M65342 Trigger finger, left ring finger: Secondary | ICD-10-CM | POA: Diagnosis not present

## 2023-02-17 ENCOUNTER — Ambulatory Visit: Payer: Medicare PPO | Admitting: Physician Assistant

## 2023-02-17 ENCOUNTER — Ambulatory Visit: Payer: Medicare PPO | Admitting: Family Medicine

## 2023-02-22 ENCOUNTER — Ambulatory Visit: Payer: Medicare PPO | Admitting: Family Medicine

## 2023-03-03 ENCOUNTER — Encounter (INDEPENDENT_AMBULATORY_CARE_PROVIDER_SITE_OTHER): Payer: Self-pay

## 2023-03-15 ENCOUNTER — Other Ambulatory Visit: Payer: Self-pay | Admitting: Family Medicine

## 2023-03-15 DIAGNOSIS — M47816 Spondylosis without myelopathy or radiculopathy, lumbar region: Secondary | ICD-10-CM

## 2023-03-25 ENCOUNTER — Ambulatory Visit: Payer: Medicare PPO | Admitting: Family Medicine

## 2023-05-03 DIAGNOSIS — H5203 Hypermetropia, bilateral: Secondary | ICD-10-CM | POA: Diagnosis not present

## 2023-05-03 DIAGNOSIS — H25813 Combined forms of age-related cataract, bilateral: Secondary | ICD-10-CM | POA: Diagnosis not present

## 2023-05-06 DIAGNOSIS — M25571 Pain in right ankle and joints of right foot: Secondary | ICD-10-CM | POA: Diagnosis not present

## 2023-05-06 DIAGNOSIS — M76821 Posterior tibial tendinitis, right leg: Secondary | ICD-10-CM | POA: Diagnosis not present

## 2023-05-10 ENCOUNTER — Encounter: Payer: Self-pay | Admitting: Family Medicine

## 2023-05-10 ENCOUNTER — Ambulatory Visit: Payer: Medicare PPO | Admitting: Family Medicine

## 2023-05-10 VITALS — BP 130/80 | HR 64 | Temp 97.6°F | Ht 63.75 in | Wt 186.8 lb

## 2023-05-10 DIAGNOSIS — E782 Mixed hyperlipidemia: Secondary | ICD-10-CM | POA: Diagnosis not present

## 2023-05-10 DIAGNOSIS — Z23 Encounter for immunization: Secondary | ICD-10-CM

## 2023-05-10 DIAGNOSIS — I10 Essential (primary) hypertension: Secondary | ICD-10-CM

## 2023-05-10 MED ORDER — LOSARTAN POTASSIUM-HCTZ 100-12.5 MG PO TABS: 1.0000 | ORAL_TABLET | Freq: Every day | ORAL | 3 refills | Status: DC

## 2023-05-10 NOTE — Patient Instructions (Signed)
[email or web address]  [facility]  [facility]

## 2023-05-10 NOTE — Progress Notes (Unsigned)
Established Patient Office Visit  Subjective   Patient ID: Melinda Ferguson, female    DOB: 1951-04-07  Age: 72 y.o. MRN: [id number]  Chief Complaint  Patient presents with  . Medical Management of Chronic Issues    Pt is here for follow up today on her HTN. She reports she took the cholesterol medication but felt that it was making her joint pain worse so she stopped taking the atorvastatin. We had a long discussion about further options, risks/benefits of statin therapy. Counseled patient on obtaining a CT calcium score to further evaluate her cardiac risk and she is agreeable to the test.   HTN -- BP in office performed and is well controlled. She  reports no side effects to the medications, no chest pain, SOB, dizziness or headaches. She has a BP cuff at home and is checking BP regularly, reports they are in the normal range.    Patient has many questions about weight loss medications as well which I answered for her.    Current Outpatient Medications  Medication Instructions  . Azelastine HCl 0.15 % SOLN 2 actuations in each nostril BID  . diphenhydramine-acetaminophen (TYLENOL PM) 25-500 MG TABS tablet 1 tablet, Oral, Daily at bedtime  . fexofenadine (ALLEGRA) 60 mg, Oral, Daily  . fluticasone (FLONASE) 50 MCG/ACT nasal spray 2 sprays, Each Nare, Daily  . gabapentin (NEURONTIN) 100 MG capsule TAKE 1-3 CAPSULES by mouth AT BEDTIME  . losartan-hydrochlorothiazide (HYZAAR) 100-12.5 MG tablet 1 tablet, Oral, Daily  . meclizine (ANTIVERT) 12.5 mg, Oral, 3 times daily PRN    Patient Active Problem List   Diagnosis Date Noted  . Pain in joint of right knee 11/18/2022  . Carpal tunnel syndrome of left wrist 06/07/2022  . Lumbar spondylosis 04/19/2022  . Osteoarthritis of right knee - sees Dr. Lequita Halt 02/19/2014  . HTN (hypertension) 01/01/2013  . Allergic rhinitis 01/01/2013      Review of Systems  All other systems reviewed and are negative.     Objective:     BP  130/80 (BP Location: Left Arm, Patient Position: Sitting, Cuff Size: Large)   Pulse 64   Temp 97.6 F (36.4 C) (Oral)   Ht 5' 3.75" (1.619 m)   Wt 186 lb 12.8 oz (84.7 kg)   LMP 07/19/1997   SpO2 98%   BMI 32.32 kg/m  {Vitals History (Optional):23777}  Physical Exam Vitals reviewed.  Constitutional:      Appearance: Normal appearance. She is well-groomed. She is obese.  Eyes:     Conjunctiva/sclera: Conjunctivae normal.  Neck:     Thyroid: No thyromegaly.  Cardiovascular:     Rate and Rhythm: Normal rate and regular rhythm.     Pulses: Normal pulses.     Heart sounds: S1 normal and S2 normal.  Pulmonary:     Effort: Pulmonary effort is normal.     Breath sounds: Normal breath sounds and air entry.  Abdominal:     General: Bowel sounds are normal.  Musculoskeletal:     Right lower leg: No edema.     Left lower leg: No edema.  Neurological:     Mental Status: She is alert and oriented to person, place, and time. Mental status is at baseline.     Gait: Gait is intact.  Psychiatric:        Mood and Affect: Mood and affect normal.        Speech: Speech normal.        Behavior: Behavior normal.  Judgment: Judgment normal.     No results found for any visits on 05/10/23.  {Labs (Optional):23779}  The 10-year ASCVD risk score (Arnett DK, et al., 2019) is: 18.1%    Assessment & Plan:  Need for immunization against influenza -     Flu Vaccine Trivalent High Dose (Fluad)  Primary hypertension -     Losartan Potassium-HCTZ; Take 1 tablet by mouth daily.  Dispense: 90 tablet; Refill: 3  Mixed hyperlipidemia -     CT CARDIAC SCORING (SELF PAY ONLY); Future     Return in about 4 months (around 09/10/2023) for annual physical exam.    Karie Georges, MD

## 2023-05-11 NOTE — Assessment & Plan Note (Signed)
Current hypertension medications:       Sig   losartan-hydrochlorothiazide (HYZAAR) 100-12.5 MG tablet Take 1 tablet by mouth daily.      BP performed in office and is WNL, will continue the above medications as prescribed. We did discuss the elevated cholesterol and her elevated CVD risk, pt states she is trying to lose weight but has not been successful. We discussed ways to get the GLP medication online.

## 2023-05-13 ENCOUNTER — Encounter: Payer: Self-pay | Admitting: Family Medicine

## 2023-05-24 DIAGNOSIS — H04123 Dry eye syndrome of bilateral lacrimal glands: Secondary | ICD-10-CM | POA: Diagnosis not present

## 2023-05-24 DIAGNOSIS — H2513 Age-related nuclear cataract, bilateral: Secondary | ICD-10-CM | POA: Diagnosis not present

## 2023-05-27 ENCOUNTER — Encounter: Payer: Self-pay | Admitting: Family Medicine

## 2023-05-30 ENCOUNTER — Telehealth: Payer: Self-pay | Admitting: Physical Medicine and Rehabilitation

## 2023-05-30 ENCOUNTER — Other Ambulatory Visit: Payer: Self-pay | Admitting: Physical Medicine and Rehabilitation

## 2023-05-30 DIAGNOSIS — M5416 Radiculopathy, lumbar region: Secondary | ICD-10-CM

## 2023-05-30 NOTE — Telephone Encounter (Signed)
Pt called requesting an appt for back injection. Please call pt at [phone].

## 2023-05-30 NOTE — Telephone Encounter (Signed)
Patient returned call asked for a call back to schedule an appointment with Dr. Alvester Morin.    [phone]

## 2023-06-02 ENCOUNTER — Telehealth: Payer: Self-pay | Admitting: Physical Medicine and Rehabilitation

## 2023-06-02 NOTE — Telephone Encounter (Signed)
Pt requesting her appt on 06/08/23 @3 :30pm be change to that morning or a different day some time in the morning, best cb [phone]

## 2023-06-02 NOTE — Telephone Encounter (Signed)
Patient called returning your call. CB#[phone]

## 2023-06-06 ENCOUNTER — Ambulatory Visit (INDEPENDENT_AMBULATORY_CARE_PROVIDER_SITE_OTHER): Payer: Medicare PPO

## 2023-06-06 VITALS — Ht 63.0 in | Wt 186.0 lb

## 2023-06-06 DIAGNOSIS — Z0000 Encounter for general adult medical examination without abnormal findings: Secondary | ICD-10-CM

## 2023-06-06 NOTE — Patient Instructions (Addendum)
Melinda Ferguson , Thank you for taking time to come for your Medicare Wellness Visit. I appreciate your ongoing commitment to your health goals. Please review the following plan we discussed and let me know if I can assist you in the future.   Referrals/Orders/Follow-Ups/Clinician Recommendations:   This is a list of the screening recommended for you and due dates:  Health Maintenance  Topic Date Due   COVID-19 Vaccine (5 - 2023-24 season) 03/20/2023   Medicare Annual Wellness Visit  06/05/2024   Mammogram  07/30/2024   Colon Cancer Screening  01/24/2028   DTaP/Tdap/Td vaccine (3 - Td or Tdap) 05/20/2031   Pneumonia Vaccine  Completed   Flu Shot  Completed   DEXA scan (bone density measurement)  Completed   Hepatitis C Screening  Completed   Zoster (Shingles) Vaccine  Completed   HPV Vaccine  Aged Out    Advanced directives: (In Chart) A copy of your advanced directives are scanned into your chart should your provider ever need it.  Next Medicare Annual Wellness Visit scheduled for next year: Yes

## 2023-06-06 NOTE — Progress Notes (Signed)
Subjective:   Melinda Ferguson is a 72 y.o. female who presents for Medicare Annual (Subsequent) preventive examination.  Visit Complete: Virtual I connected with  [name] on 06/06/23 by a audio enabled telemedicine application and verified that I am speaking with the correct person using two identifiers.  Patient Location: Home  Provider Location: Home Office  I discussed the limitations of evaluation and management by telemedicine. The patient expressed understanding and agreed to proceed.  Vital Signs: Because this visit was a virtual/telehealth visit, some criteria may be missing or patient reported. Any vitals not documented were not able to be obtained and vitals that have been documented are patient reported.    Cardiac Risk Factors include: advanced age (>24men, >38 women);hypertension     Objective:    Today's Vitals   06/06/23 0818  Weight: 186 lb (84.4 kg)  Height: 5\' 3"  (1.6 m)   Body mass index is 32.95 kg/m.     06/06/2023    8:27 AM 09/16/2022    1:43 PM 06/02/2022    8:27 AM 05/26/2021    3:03 PM 10/15/2020    9:32 AM 05/20/2020    8:11 AM 02/11/2016    1:08 PM  Advanced Directives  Does Patient Have a Medical Advance Directive? Yes Yes Yes Yes Yes Yes Yes  Type of Estate agent of [place];Living will Healthcare Power of [place];Living will Healthcare Power of [place];Living will Healthcare Power of [place];Living will  Healthcare Power of [place];Living will Healthcare Power of [place];Living will  Does patient want to make changes to medical advance directive? No - Patient declined        Copy of Healthcare Power of Attorney in Chart? Yes - validated most recent copy scanned in chart (See row information)  No - copy requested No - copy requested  No - copy requested     Current Medications (verified) Outpatient Encounter Medications as of 06/06/2023  Medication Sig   Azelastine HCl 0.15 % SOLN 2 actuations in each  nostril BID   diphenhydramine-acetaminophen (TYLENOL PM) 25-500 MG TABS tablet Take 1 tablet by mouth at bedtime.   fexofenadine (ALLEGRA) 60 MG tablet Take 60 mg by mouth daily.   fluticasone (FLONASE) 50 MCG/ACT nasal spray Place 2 sprays into both nostrils daily.   gabapentin (NEURONTIN) 100 MG capsule TAKE 1-3 CAPSULES by mouth AT BEDTIME   losartan-hydrochlorothiazide (HYZAAR) 100-12.5 MG tablet Take 1 tablet by mouth daily.   meclizine (ANTIVERT) 12.5 MG tablet Take 1 tablet (12.5 mg total) by mouth 3 (three) times daily as needed for dizziness.   No facility-administered encounter medications on file as of 06/06/2023.    Allergies (verified) Patient has no known allergies.   History: Past Medical History:  Diagnosis Date   Allergy    Arthritis    Colon polyps    Hypertension    Past Surgical History:  Procedure Laterality Date   COLONOSCOPY     orthoscopy Left 1994   KNEE   TONSILLECTOMY     wisdom teeth extraction  1970   Family History  Problem Relation Age of Onset   Alzheimer's disease Mother 50       passed at 49   Diverticulosis Mother    Heart disease Father 64       CAD, bypass   Migraines Sister    Colon cancer Maternal Grandfather 26   Esophageal cancer Neg Hx    Stomach cancer Neg Hx    Rectal cancer Neg Hx  Breast cancer Neg Hx    Social History   Socioeconomic History   Marital status: Married    Spouse name: Not on file   Number of children: Not on file   Years of education: Not on file   Highest education level: Not on file  Occupational History   Occupation: language facilitator    Comment: retired  Tobacco Use   Smoking status: Former    Current packs/day: 0.00    Types: Cigarettes    Quit date: 01/04/1979    Years since quitting: 44.4   Smokeless tobacco: Never  Vaping Use   Vaping status: Never Used  Substance and Sexual Activity   Alcohol use: Yes    Alcohol/week: 7.0 standard drinks of alcohol    Types: 7 Glasses of wine  per week   Drug use: No   Sexual activity: Yes  Other Topics Concern   Not on file  Social History Narrative   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      Home Situation: lives with husband      Spiritual Beliefs: none      Lifestyle: walks about 1 hour daily; no diet plan            Social Determinants of Health   Financial Resource Strain: Low Risk  (06/06/2023)   Overall Financial Resource Strain (CARDIA)    Difficulty of Paying Living Expenses: Not hard at all  Food Insecurity: No Food Insecurity (06/06/2023)   Hunger Vital Sign    Worried About Running Out of Food in the Last Year: Never true    Ran Out of Food in the Last Year: Never true  Transportation Needs: No Transportation Needs (06/06/2023)   PRAPARE - Administrator, Civil Service (Medical): No    Lack of Transportation (Non-Medical): No  Physical Activity: Sufficiently Active (06/06/2023)   Exercise Vital Sign    Days of Exercise per Week: 6 days    Minutes of Exercise per Session: 30 min  Stress: No Stress Concern Present (06/06/2023)   [employer] of Occupational Health - Occupational Stress Questionnaire    Feeling of Stress : Not at all  Social Connections: Moderately Isolated (06/02/2022)   Social Connection and Isolation Panel [NHANES]    Frequency of Communication with Friends and Family: More than three times a week    Frequency of Social Gatherings with Friends and Family: More than three times a week    Attends Religious Services: Never    Database administrator or Organizations: No    Attends Engineer, structural: Never    Marital Status: Married    Tobacco Counseling Counseling given: Not Answered   Clinical Intake:  Pre-visit preparation completed: Yes  Pain : No/denies pain     BMI - recorded: 32.95 Nutritional Status: BMI > 30  Obese Nutritional Risks: None Diabetes: No  How often do you need to have someone help  you when you read instructions, pamphlets, or other written materials from your doctor or pharmacy?: 1 - Never  Interpreter Needed?: No  Information entered by :: Theresa Mulligan LPN   Activities of Daily Living    06/06/2023    8:25 AM  In your present state of health, do you have any difficulty performing the following activities:  Hearing? 0  Vision? 0  Difficulty concentrating or making decisions? 0  Walking or climbing stairs? 0  Dressing or bathing? 0  Doing errands, shopping? 0  Preparing Food and eating ? N  Using the Toilet? N  In the past six months, have you accidently leaked urine? N  Do you have problems with loss of bowel control? N  Managing your Medications? N  Managing your Finances? N  Housekeeping or managing your Housekeeping? N    Patient Care Team: Karie Georges, MD as PCP - General (Family Medicine) Ria Comment, FNP (Obstetrics and Gynecology) Sharyn Lull, Elvin So, MD as Referring Physician (Dermatology)  Indicate any recent Medical Services you may have received from other than [facility] providers in the past year (date may be approximate).     Assessment:   This is a routine wellness examination for [place].  Hearing/Vision screen Hearing Screening - Comments:: Denies hearing difficulties   Vision Screening - Comments:: Wears rx contacts - up to date with routine [facility] exams with  [facility]   Goals Addressed               This Visit's Progress     Patient Stated (pt-stated)        Continue to lose weight.       Depression Screen    06/06/2023    8:23 AM 08/19/2022    9:29 AM 06/02/2022    8:24 AM 04/19/2022    9:21 AM 01/15/2022   11:06 AM 07/27/2021    8:49 AM 05/26/2021    3:04 PM  PHQ 2/9 Scores  PHQ - 2 Score 0 0 0 0 0 1 0  PHQ- 9 Score  0 0 0 0 2     Fall Risk    06/06/2023    8:25 AM 08/24/2022    8:11 AM 08/19/2022    9:25 AM 06/02/2022    8:26 AM 06/01/2022    8:37 AM  Fall Risk   Falls in the past year?  0 0 0 0 0  Number falls in past yr: 0 0 0 0   Injury with Fall? 0 0 0 0   Risk for fall due to : No Fall Risks No Fall Risks No Fall Risks No Fall Risks   Follow up Falls prevention discussed  Falls evaluation completed Falls prevention discussed     MEDICARE RISK AT HOME: Medicare Risk at Home Any stairs in or around the home?: Yes If so, are there any without handrails?: No Home free of loose throw rugs in walkways, pet beds, electrical cords, etc?: Yes Adequate lighting in your home to reduce risk of falls?: Yes Life alert?: No Use of a cane, walker or w/c?: No Grab bars in the bathroom?: Yes Shower chair or bench in shower?: No Elevated toilet seat or a handicapped toilet?: No  TIMED UP AND GO:  Was the test performed?  No    Cognitive Function:        06/06/2023    8:27 AM 06/02/2022    8:28 AM 05/26/2021    3:06 PM 05/20/2020    8:14 AM  6CIT Screen  What Year? 0 points 0 points 0 points 0 points  What month? 0 points 0 points 0 points 0 points  What time? 0 points 0 points 0 points   Count back from 20 0 points 0 points 0 points 0 points  Months in reverse 0 points 0 points 0 points 0 points  Repeat phrase 0 points 0 points 0 points 0 points  Total Score 0 points 0 points 0 points     Immunizations Immunization History  Administered Date(s) Administered  Fluad Trivalent(High Dose 65+) 05/10/2023   Influenza, High Dose Seasonal PF 05/05/2018, 04/18/2019   Influenza-Unspecified 04/18/2014, 04/23/2020   Moderna Covid-19 Vaccine Bivalent Booster 51yrs & up 05/25/2021   Moderna Sars-Covid-2 Vaccination 08/20/2019, 09/21/2019, 05/14/2020   PNEUMOCOCCAL CONJUGATE-20 07/27/2021   Pneumococcal Conjugate-13 10/29/2015   Pneumococcal Polysaccharide-23 01/18/2017   Tdap 01/04/2012, 05/19/2021   Zoster Recombinant(Shingrix) 10/01/2016, 01/25/2017   Zoster, Live 01/04/2012    TDAP status: Up to date  Flu Vaccine status: Up to date  Pneumococcal vaccine status: Up  to date  Covid-19 vaccine status: Declined, Education has been provided regarding the importance of this vaccine but patient still declined. Advised may receive this vaccine at local pharmacy or Health Dept.or vaccine clinic. Aware to provide a copy of the vaccination record if obtained from local pharmacy or Health Dept. Verbalized acceptance and understanding.  Qualifies for Shingles Vaccine? Yes   Zostavax completed Yes   Shingrix Completed?: Yes  Screening Tests Health Maintenance  Topic Date Due   COVID-19 Vaccine (5 - 2023-24 season) 03/20/2023   Medicare Annual Wellness (AWV)  06/05/2024   MAMMOGRAM  07/30/2024   Colonoscopy  01/24/2028   DTaP/Tdap/Td (3 - Td or Tdap) 05/20/2031   Pneumonia Vaccine 37+ Years old  Completed   INFLUENZA VACCINE  Completed   DEXA SCAN  Completed   Hepatitis C Screening  Completed   Zoster Vaccines- Shingrix  Completed   HPV VACCINES  Aged Out    Health Maintenance  Health Maintenance Due  Topic Date Due   COVID-19 Vaccine (5 - 2023-24 season) 03/20/2023    Colorectal cancer screening: Type of screening: Colonoscopy. Completed 01/24/23. Repeat every 5 years  Mammogram status: Completed 07/30/22. Repeat every year  Bone Density status: Completed 02/10/23. Results reflect: Bone density results: OSTEOPENIA. Repeat every   years.   Additional Screening:  Hepatitis C Screening: does qualify; Completed 01/18/17  Vision Screening: Recommended annual ophthalmology exams for early detection of glaucoma and other disorders of the eye. Is the patient up to date with their annual eye exam?  Yes  Who is the provider or what is the name of the office in which the patient attends annual eye exams? [facility] If pt is not established with a provider, would they like to be referred to a provider to establish care? No .   Dental Screening: Recommended annual dental exams for proper oral hygiene    Community Resource Referral / Chronic Care  Management:  CRR required this visit?  No   CCM required this visit?  No     Plan:     I have personally reviewed and noted the following in the patient's chart:   Medical and social history Use of alcohol, tobacco or illicit drugs  Current medications and supplements including opioid prescriptions. Patient is not currently taking opioid prescriptions. Functional ability and status Nutritional status Physical activity Advanced directives List of other physicians Hospitalizations, surgeries, and ER visits in previous 12 months Vitals Screenings to include cognitive, depression, and falls Referrals and appointments  In addition, I have reviewed and discussed with patient certain preventive protocols, quality metrics, and best practice recommendations. A written personalized care plan for preventive services as well as general preventive health recommendations were provided to patient.     Tillie Rung, LPN   [id number]/04/[id number]   After Visit Summary: (MyChart) Due to this being a telephonic visit, the after visit summary with patients personalized plan was offered to patient via MyChart   Nurse Notes:  None

## 2023-06-08 ENCOUNTER — Encounter: Payer: Medicare PPO | Admitting: Physical Medicine and Rehabilitation

## 2023-06-13 ENCOUNTER — Other Ambulatory Visit: Payer: Self-pay

## 2023-06-13 ENCOUNTER — Ambulatory Visit: Payer: Medicare PPO | Admitting: Physical Medicine and Rehabilitation

## 2023-06-13 DIAGNOSIS — M5416 Radiculopathy, lumbar region: Secondary | ICD-10-CM

## 2023-06-13 MED ORDER — METHYLPREDNISOLONE ACETATE 40 MG/ML IJ SUSP
40.0000 mg | Freq: Once | INTRAMUSCULAR | Status: AC
Start: 2023-06-13 — End: 2023-06-13
  Administered 2023-06-13: 40 mg

## 2023-06-13 NOTE — Progress Notes (Signed)
Melinda Ferguson - 72 y.o. female MRN [id number]  Date of birth: [birth year]  Office Visit Note: Visit Date: 06/13/2023 PCP: Karie Georges, MD Referred by: Karie Georges, MD  Subjective: Chief Complaint  Patient presents with   Lower Back - Pain   HPI:  Melinda Ferguson is a 72 y.o. female who comes in today at the request of Ellin Goodie, FNP for planned Right L3-4 Lumbar Interlaminar epidural steroid injection with fluoroscopic guidance.  The patient has failed conservative care including home exercise, medications, time and activity modification.  This injection will be diagnostic and hopefully therapeutic.  Please see requesting physician notes for further details and justification.   ROS Otherwise per HPI.  Assessment & Plan: Visit Diagnoses:    ICD-10-CM   1. Lumbar radiculopathy  M54.16 XR C-ARM NO REPORT    Epidural Steroid injection    methylPREDNISolone acetate (DEPO-MEDROL) injection 40 mg      Plan: No additional findings.   Meds & Orders:  Meds ordered this encounter  Medications   methylPREDNISolone acetate (DEPO-MEDROL) injection 40 mg    Orders Placed This Encounter  Procedures   XR C-ARM NO REPORT   Epidural Steroid injection    Follow-up: Return for visit to requesting provider as needed.   Procedures: No procedures performed  Lumbar Epidural Steroid Injection - Interlaminar Approach with Fluoroscopic Guidance  Patient: Melinda Ferguson      Date of Birth: 09-19-1950 MRN: [id number] PCP: Karie Georges, MD      Visit Date: 06/13/2023   Universal Protocol:     Consent Given By: the patient  Position: PRONE  Additional Comments: Vital signs were monitored before and after the procedure. Patient was prepped and draped in the usual sterile fashion. The correct patient, procedure, and site was verified.   Injection Procedure Details:   Procedure diagnoses: Lumbar radiculopathy [M54.16]   Meds Administered:  Meds  ordered this encounter  Medications   methylPREDNISolone acetate (DEPO-MEDROL) injection 40 mg     Laterality: Right  Location/Site:  L3-4  Needle: 3.5 in., 20 ga. Tuohy  Needle Placement: Paramedian epidural  Findings:   -Comments: Excellent flow of contrast into the epidural space.  Procedure Details: Using a paramedian approach from the side mentioned above, the region overlying the inferior lamina was localized under fluoroscopic visualization and the soft tissues overlying this structure were infiltrated with 4 ml. of 1% Lidocaine without Epinephrine. The Tuohy needle was inserted into the epidural space using a paramedian approach.   The epidural space was localized using loss of resistance along with counter oblique bi-planar fluoroscopic views.  After negative aspirate for air, blood, and CSF, a 2 ml. volume of Isovue-250 was injected into the epidural space and the flow of contrast was observed. Radiographs were obtained for documentation purposes.    The injectate was administered into the level noted above.   Additional Comments:  The patient tolerated the procedure well Dressing: 2 x 2 sterile gauze and Band-Aid    Post-procedure details: Patient was observed during the procedure. Post-procedure instructions were reviewed.  Patient left the clinic in stable condition.   Clinical History: Narrative & Impression CLINICAL DATA:  Low back pain for 5 years.   EXAM: MRI LUMBAR SPINE WITHOUT CONTRAST   TECHNIQUE: Multiplanar, multisequence MR imaging of the lumbar spine was performed. No intravenous contrast was administered.   COMPARISON:  Lumbar spine radiographs 07/27/2021   FINDINGS: Segmentation: There are five lumbar type vertebral bodies. The  last full intervertebral disc space is labeled L5-S1. This correlates with the radiographs.   Alignment: Degenerative lumbar spondylosis with mild multilevel degenerative listhesis but normal overall alignment.    Vertebrae:  Normal marrow signal.  No bone lesions or fractures.   Conus medullaris and cauda equina: Conus extends to the L1-2 level. Conus and cauda equina appear normal.   Paraspinal and other soft tissues: Small simple parenchymal and parapelvic renal cysts not requiring any further imaging evaluation or follow-up.   Disc levels:   T12-L1: Mild bulging annulus and mild facet disease with mild bilateral lateral recess encroachment but no spinal foraminal stenosis.   L1-2: Degenerative retrolisthesis of L1 compared L2. Degenerative disc disease with a bulging annulus and osteophytic ridging. There is also mild facet disease and ligamentum flavum thickening but no significant spinal stenosis. Mild bilateral lateral recess stenosis. No significant foraminal stenosis.   L2-3: Bulging annulus, osteophytic ridging and facet disease contributing to early spinal stenosis and mild bilateral lateral recess stenosis. No significant foraminal stenosis.   L3-4: Bulging degenerated annulus, moderate facet disease and ligamentum flavum thickening contributing to mild/early spinal stenosis, mild right lateral recess stenosis and moderate left lateral recess stenosis. No significant foraminal stenosis. Mild left foraminal encroachment.   L4-5: Bulging annulus, osteophytic ridging, facet disease and ligamentum flavum thickening but no significant spinal stenosis. Moderate bilateral lateral recess stenosis, left greater than right and mild bilateral foraminal stenosis, left greater than right.   L5-S1: Advanced facet disease and bulging degenerated annulus but no significant spinal or lateral recess stenosis. Mild right foraminal stenosis.   IMPRESSION: 1. Degenerative lumbar spondylosis with multilevel disc disease and facet disease. 2. Mild bilateral lateral recess stenosis at L1-2. 3. Early spinal stenosis and mild bilateral lateral recess stenosis at L2-3. 4. Mild/early spinal  stenosis, mild right lateral recess stenosis and moderate left lateral recess stenosis at L3-4. Mild left foraminal encroachment. 5. Moderate bilateral lateral recess stenosis, left greater than right and mild bilateral foraminal stenosis, left greater than right at L4-5. 6. Mild right foraminal stenosis at L5-S1.     Electronically Signed   By: Rudie Meyer M.D.   On: 09/12/2022 10:34     Objective:  VS:  HT:    WT:   BMI:     BP:   HR: bpm  TEMP: ( )  RESP:  Physical Exam Vitals and nursing note reviewed.  Constitutional:      General: She is not in acute distress.    Appearance: Normal appearance. She is not ill-appearing.  HENT:     Head: Normocephalic and atraumatic.     Right Ear: External ear normal.     Left Ear: External ear normal.  Eyes:     Extraocular Movements: Extraocular movements intact.  Cardiovascular:     Rate and Rhythm: Normal rate.     Pulses: Normal pulses.  Pulmonary:     Effort: Pulmonary effort is normal. No respiratory distress.  Abdominal:     General: There is no distension.     Palpations: Abdomen is soft.  Musculoskeletal:        General: Tenderness present.     Cervical back: Neck supple.     Right lower leg: No edema.     Left lower leg: No edema.     Comments: Patient has good distal strength with no pain over the greater trochanters.  No clonus or focal weakness.  Skin:    Findings: No erythema, lesion or rash.  Neurological:  General: No focal deficit present.     Mental Status: She is alert and oriented to person, place, and time.     Sensory: No sensory deficit.     Motor: No weakness or abnormal muscle tone.     Coordination: Coordination normal.  Psychiatric:        Mood and Affect: Mood normal.        Behavior: Behavior normal.      Imaging: XR C-ARM NO REPORT  Result Date: 06/13/2023 Please see Notes tab for imaging impression.

## 2023-06-13 NOTE — Procedures (Signed)
Lumbar Epidural Steroid Injection - Interlaminar Approach with Fluoroscopic Guidance  Patient: Melinda Ferguson      Date of Birth: [birth year] MRN: [id number] PCP: Karie Georges, MD      Visit Date: 06/13/2023   Universal Protocol:     Consent Given By: the patient  Position: PRONE  Additional Comments: Vital signs were monitored before and after the procedure. Patient was prepped and draped in the usual sterile fashion. The correct patient, procedure, and site was verified.   Injection Procedure Details:   Procedure diagnoses: Lumbar radiculopathy [M54.16]   Meds Administered:  Meds ordered this encounter  Medications   methylPREDNISolone acetate (DEPO-MEDROL) injection 40 mg     Laterality: Right  Location/Site:  L3-4  Needle: 3.5 in., 20 ga. Tuohy  Needle Placement: Paramedian epidural  Findings:   -Comments: Excellent flow of contrast into the epidural space.  Procedure Details: Using a paramedian approach from the side mentioned above, the region overlying the inferior lamina was localized under fluoroscopic visualization and the soft tissues overlying this structure were infiltrated with 4 ml. of 1% Lidocaine without Epinephrine. The Tuohy needle was inserted into the epidural space using a paramedian approach.   The epidural space was localized using loss of resistance along with counter oblique bi-planar fluoroscopic views.  After negative aspirate for air, blood, and CSF, a 2 ml. volume of Isovue-250 was injected into the epidural space and the flow of contrast was observed. Radiographs were obtained for documentation purposes.    The injectate was administered into the level noted above.   Additional Comments:  The patient tolerated the procedure well Dressing: 2 x 2 sterile gauze and Band-Aid    Post-procedure details: Patient was observed during the procedure. Post-procedure instructions were reviewed.  Patient left the clinic in stable  condition.

## 2023-06-13 NOTE — Patient Instructions (Signed)

## 2023-06-14 DIAGNOSIS — H2513 Age-related nuclear cataract, bilateral: Secondary | ICD-10-CM | POA: Diagnosis not present

## 2023-06-29 DIAGNOSIS — H25012 Cortical age-related cataract, left eye: Secondary | ICD-10-CM | POA: Diagnosis not present

## 2023-06-29 DIAGNOSIS — H25812 Combined forms of age-related cataract, left eye: Secondary | ICD-10-CM | POA: Diagnosis not present

## 2023-06-29 DIAGNOSIS — H2512 Age-related nuclear cataract, left eye: Secondary | ICD-10-CM | POA: Diagnosis not present

## 2023-07-03 DIAGNOSIS — M25571 Pain in right ankle and joints of right foot: Secondary | ICD-10-CM | POA: Diagnosis not present

## 2023-07-19 ENCOUNTER — Other Ambulatory Visit: Payer: Self-pay | Admitting: Family Medicine

## 2023-07-19 DIAGNOSIS — Z1231 Encounter for screening mammogram for malignant neoplasm of breast: Secondary | ICD-10-CM

## 2023-07-22 DIAGNOSIS — M25571 Pain in right ankle and joints of right foot: Secondary | ICD-10-CM | POA: Diagnosis not present

## 2023-07-22 DIAGNOSIS — M170 Bilateral primary osteoarthritis of knee: Secondary | ICD-10-CM | POA: Diagnosis not present

## 2023-08-01 ENCOUNTER — Telehealth: Payer: Self-pay

## 2023-08-01 DIAGNOSIS — M5416 Radiculopathy, lumbar region: Secondary | ICD-10-CM

## 2023-08-01 NOTE — Telephone Encounter (Signed)
 Patient called today stating she has been having LBP since Christmas. She had her last injection 11/25 and it gave her good relief for a month. She states its the same pain she has been having in her back. Pain has been around a 9. She took some Robaxin  yesterday and that did help. She has had no injuries or falls since injection. Does she need and OV or repeat?

## 2023-08-01 NOTE — Addendum Note (Signed)
 Addended by: Ashok Norris on: 08/01/2023 09:44 AM   Modules accepted: Orders

## 2023-08-02 DIAGNOSIS — D485 Neoplasm of uncertain behavior of skin: Secondary | ICD-10-CM | POA: Diagnosis not present

## 2023-08-02 DIAGNOSIS — L905 Scar conditions and fibrosis of skin: Secondary | ICD-10-CM | POA: Diagnosis not present

## 2023-08-02 DIAGNOSIS — D3617 Benign neoplasm of peripheral nerves and autonomic nervous system of trunk, unspecified: Secondary | ICD-10-CM | POA: Diagnosis not present

## 2023-08-04 ENCOUNTER — Ambulatory Visit
Admission: RE | Admit: 2023-08-04 | Discharge: 2023-08-04 | Disposition: A | Discharge status: Home / Self Care | Payer: Medicare PPO | Source: Ambulatory Visit | Attending: Family Medicine | Admitting: Family Medicine

## 2023-08-04 DIAGNOSIS — Z1231 Encounter for screening mammogram for malignant neoplasm of breast: Secondary | ICD-10-CM | POA: Diagnosis not present

## 2023-08-08 DIAGNOSIS — M76821 Posterior tibial tendinitis, right leg: Secondary | ICD-10-CM | POA: Diagnosis not present

## 2023-08-08 DIAGNOSIS — M25571 Pain in right ankle and joints of right foot: Secondary | ICD-10-CM | POA: Diagnosis not present

## 2023-08-10 ENCOUNTER — Encounter: Payer: Self-pay | Admitting: Family Medicine

## 2023-08-10 DIAGNOSIS — H25811 Combined forms of age-related cataract, right eye: Secondary | ICD-10-CM | POA: Diagnosis not present

## 2023-08-10 DIAGNOSIS — H25011 Cortical age-related cataract, right eye: Secondary | ICD-10-CM | POA: Diagnosis not present

## 2023-08-10 DIAGNOSIS — H2511 Age-related nuclear cataract, right eye: Secondary | ICD-10-CM | POA: Diagnosis not present

## 2023-08-17 DIAGNOSIS — M65331 Trigger finger, right middle finger: Secondary | ICD-10-CM | POA: Diagnosis not present

## 2023-08-17 DIAGNOSIS — M65341 Trigger finger, right ring finger: Secondary | ICD-10-CM | POA: Diagnosis not present

## 2023-08-23 ENCOUNTER — Ambulatory Visit: Payer: Medicare PPO | Admitting: Physical Medicine and Rehabilitation

## 2023-08-23 ENCOUNTER — Other Ambulatory Visit: Payer: Self-pay

## 2023-08-23 VITALS — BP 153/88

## 2023-08-23 DIAGNOSIS — M5416 Radiculopathy, lumbar region: Secondary | ICD-10-CM | POA: Diagnosis not present

## 2023-08-23 MED ORDER — METHYLPREDNISOLONE ACETATE 40 MG/ML IJ SUSP
40.0000 mg | Freq: Once | INTRAMUSCULAR | Status: AC
Start: 2023-08-23 — End: 2023-08-23
  Administered 2023-08-23: 40 mg

## 2023-08-23 NOTE — Progress Notes (Signed)
 Melinda Ferguson - 73 y.o. female MRN [id number]  Date of birth: 02/12/1951  Office Visit Note: Visit Date: 08/23/2023 PCP: Ozell Heron HERO, MD Referred by: Ozell Heron HERO, MD  Subjective: Chief Complaint  Patient presents with   Lower Back - Pain   HPI:  [name] is a 73 y.o. female who comes in today for planned repeat Right L3-4  Lumbar Interlaminar epidural steroid injection with fluoroscopic guidance.  The patient has failed conservative care including home exercise, medications, time and activity modification.  This injection will be diagnostic and hopefully therapeutic.  Please see requesting physician notes for further details and justification. Patient received more than 50% pain relief from prior injection.  Consider facet blocks.  Referring: Duwaine Pouch, FNP   ROS Otherwise per HPI.  Assessment & Plan: Visit Diagnoses:    ICD-10-CM   1. Lumbar radiculopathy  M54.16 XR C-ARM NO REPORT    Epidural Steroid injection    methylPREDNISolone  acetate (DEPO-MEDROL ) injection 40 mg      Plan: No additional findings.   Meds & Orders:  Meds ordered this encounter  Medications   methylPREDNISolone  acetate (DEPO-MEDROL ) injection 40 mg    Orders Placed This Encounter  Procedures   XR C-ARM NO REPORT   Epidural Steroid injection    Follow-up: Return if symptoms worsen or fail to improve.   Procedures: No procedures performed  Lumbar Epidural Steroid Injection - Interlaminar Approach with Fluoroscopic Guidance  Patient: Melinda Ferguson      Date of Birth: 02-12-1951 MRN: [id number] PCP: Ozell Heron HERO, MD      Visit Date: 08/23/2023   Universal Protocol:     Consent Given By: the patient  Position: PRONE  Additional Comments: Vital signs were monitored before and after the procedure. Patient was prepped and draped in the usual sterile fashion. The correct patient, procedure, and site was verified.   Injection Procedure Details:    Procedure diagnoses: Lumbar radiculopathy [M54.16]   Meds Administered:  Meds ordered this encounter  Medications   methylPREDNISolone  acetate (DEPO-MEDROL ) injection 40 mg     Laterality: Right  Location/Site:  L3-4  Needle: 3.5 in., 20 ga. Tuohy  Needle Placement: Paramedian epidural  Findings:   -Comments: Excellent flow of contrast into the epidural space.  Procedure Details: Using a paramedian approach from the side mentioned above, the region overlying the inferior lamina was localized under fluoroscopic visualization and the soft tissues overlying this structure were infiltrated with 4 ml. of 1% Lidocaine  without Epinephrine. The Tuohy needle was inserted into the epidural space using a paramedian approach.   The epidural space was localized using loss of resistance along with counter oblique bi-planar fluoroscopic views.  After negative aspirate for air, blood, and CSF, a 2 ml. volume of Isovue-250 was injected into the epidural space and the flow of contrast was observed. Radiographs were obtained for documentation purposes.    The injectate was administered into the level noted above.   Additional Comments:  No complications occurred Dressing: 2 x 2 sterile gauze and Band-Aid    Post-procedure details: Patient was observed during the procedure. Post-procedure instructions were reviewed.  Patient left the clinic in stable condition.   Clinical History: Narrative & Impression CLINICAL DATA:  Low back pain for 5 years.   EXAM: MRI LUMBAR SPINE WITHOUT CONTRAST   TECHNIQUE: Multiplanar, multisequence MR imaging of the lumbar spine was performed. No intravenous contrast was administered.   COMPARISON:  Lumbar spine radiographs 07/27/2021  FINDINGS: Segmentation: There are five lumbar type vertebral bodies. The last full intervertebral disc space is labeled L5-S1. This correlates with the radiographs.   Alignment: Degenerative lumbar spondylosis with  mild multilevel degenerative listhesis but normal overall alignment.   Vertebrae:  Normal marrow signal.  No bone lesions or fractures.   Conus medullaris and cauda equina: Conus extends to the L1-2 level. Conus and cauda equina appear normal.   Paraspinal and other soft tissues: Small simple parenchymal and parapelvic renal cysts not requiring any further imaging evaluation or follow-up.   Disc levels:   T12-L1: Mild bulging annulus and mild facet disease with mild bilateral lateral recess encroachment but no spinal foraminal stenosis.   L1-2: Degenerative retrolisthesis of L1 compared L2. Degenerative disc disease with a bulging annulus and osteophytic ridging. There is also mild facet disease and ligamentum flavum thickening but no significant spinal stenosis. Mild bilateral lateral recess stenosis. No significant foraminal stenosis.   L2-3: Bulging annulus, osteophytic ridging and facet disease contributing to early spinal stenosis and mild bilateral lateral recess stenosis. No significant foraminal stenosis.   L3-4: Bulging degenerated annulus, moderate facet disease and ligamentum flavum thickening contributing to mild/early spinal stenosis, mild right lateral recess stenosis and moderate left lateral recess stenosis. No significant foraminal stenosis. Mild left foraminal encroachment.   L4-5: Bulging annulus, osteophytic ridging, facet disease and ligamentum flavum thickening but no significant spinal stenosis. Moderate bilateral lateral recess stenosis, left greater than right and mild bilateral foraminal stenosis, left greater than right.   L5-S1: Advanced facet disease and bulging degenerated annulus but no significant spinal or lateral recess stenosis. Mild right foraminal stenosis.   IMPRESSION: 1. Degenerative lumbar spondylosis with multilevel disc disease and facet disease. 2. Mild bilateral lateral recess stenosis at L1-2. 3. Early spinal stenosis and  mild bilateral lateral recess stenosis at L2-3. 4. Mild/early spinal stenosis, mild right lateral recess stenosis and moderate left lateral recess stenosis at L3-4. Mild left foraminal encroachment. 5. Moderate bilateral lateral recess stenosis, left greater than right and mild bilateral foraminal stenosis, left greater than right at L4-5. 6. Mild right foraminal stenosis at L5-S1.     Electronically Signed   By: MYRTIS Stammer M.D.   On: 09/12/2022 10:34     Objective:  VS:  HT:    WT:   BMI:     BP:(!) 153/88  HR: bpm  TEMP: ( )  RESP:  Physical Exam Vitals and nursing note reviewed.  Constitutional:      General: She is not in acute distress.    Appearance: Normal appearance. She is not ill-appearing.  HENT:     Head: Normocephalic and atraumatic.     Right Ear: External ear normal.     Left Ear: External ear normal.  Eyes:     Extraocular Movements: Extraocular movements intact.  Cardiovascular:     Rate and Rhythm: Normal rate.     Pulses: Normal pulses.  Pulmonary:     Effort: Pulmonary effort is normal. No respiratory distress.  Abdominal:     General: There is no distension.     Palpations: Abdomen is soft.  Musculoskeletal:        General: Tenderness present.     Cervical back: Neck supple.     Right lower leg: No edema.     Left lower leg: No edema.     Comments: Patient has good distal strength with no pain over the greater trochanters.  No clonus or focal weakness.  Skin:  Findings: No erythema, lesion or rash.  Neurological:     General: No focal deficit present.     Mental Status: She is alert and oriented to person, place, and time.     Sensory: No sensory deficit.     Motor: No weakness or abnormal muscle tone.     Coordination: Coordination normal.  Psychiatric:        Mood and Affect: Mood normal.        Behavior: Behavior normal.      Imaging: XR C-ARM NO REPORT Result Date: 08/23/2023 Please see Notes tab for imaging impression.

## 2023-08-23 NOTE — Progress Notes (Signed)
 Functional Pain Scale - descriptive words and definitions  Mild (2)   Noticeable when not distracted/no impact on ADL's/sleep only slightly affected and able to   use both passive and active distraction for comfort. Mild range order  Average Pain 2 153/88 R>L  +Driver, -BT, -Dye Allergies.

## 2023-08-23 NOTE — Procedures (Signed)
 Lumbar Epidural Steroid Injection - Interlaminar Approach with Fluoroscopic Guidance  Patient: Melinda Ferguson      Date of Birth: 11/23/1950 MRN: [id number] PCP: Ozell Heron HERO, MD      Visit Date: 08/23/2023   Universal Protocol:     Consent Given By: the patient  Position: PRONE  Additional Comments: Vital signs were monitored before and after the procedure. Patient was prepped and draped in the usual sterile fashion. The correct patient, procedure, and site was verified.   Injection Procedure Details:   Procedure diagnoses: Lumbar radiculopathy [M54.16]   Meds Administered:  Meds ordered this encounter  Medications   methylPREDNISolone  acetate (DEPO-MEDROL ) injection 40 mg     Laterality: Right  Location/Site:  L3-4  Needle: 3.5 in., 20 ga. Tuohy  Needle Placement: Paramedian epidural  Findings:   -Comments: Excellent flow of contrast into the epidural space.  Procedure Details: Using a paramedian approach from the side mentioned above, the region overlying the inferior lamina was localized under fluoroscopic visualization and the soft tissues overlying this structure were infiltrated with 4 ml. of 1% Lidocaine  without Epinephrine. The Tuohy needle was inserted into the epidural space using a paramedian approach.   The epidural space was localized using loss of resistance along with counter oblique bi-planar fluoroscopic views.  After negative aspirate for air, blood, and CSF, a 2 ml. volume of Isovue-250 was injected into the epidural space and the flow of contrast was observed. Radiographs were obtained for documentation purposes.    The injectate was administered into the level noted above.   Additional Comments:  No complications occurred Dressing: 2 x 2 sterile gauze and Band-Aid    Post-procedure details: Patient was observed during the procedure. Post-procedure instructions were reviewed.  Patient left the clinic in stable condition.

## 2023-08-26 DIAGNOSIS — M1711 Unilateral primary osteoarthritis, right knee: Secondary | ICD-10-CM | POA: Diagnosis not present

## 2023-09-02 DIAGNOSIS — M1711 Unilateral primary osteoarthritis, right knee: Secondary | ICD-10-CM | POA: Diagnosis not present

## 2023-09-05 ENCOUNTER — Ambulatory Visit
Admission: RE | Admit: 2023-09-05 | Discharge: 2023-09-05 | Disposition: A | Discharge status: Home / Self Care | Payer: No Typology Code available for payment source | Source: Ambulatory Visit | Attending: Family Medicine | Admitting: Family Medicine

## 2023-09-05 DIAGNOSIS — E782 Mixed hyperlipidemia: Secondary | ICD-10-CM

## 2023-09-07 ENCOUNTER — Encounter: Payer: Self-pay | Admitting: Family Medicine

## 2023-09-09 DIAGNOSIS — M1711 Unilateral primary osteoarthritis, right knee: Secondary | ICD-10-CM | POA: Diagnosis not present

## 2023-09-13 ENCOUNTER — Encounter: Payer: Self-pay | Admitting: Family Medicine

## 2023-09-13 ENCOUNTER — Ambulatory Visit: Payer: Medicare PPO | Admitting: Family Medicine

## 2023-09-13 VITALS — BP 132/70 | HR 88 | Temp 97.7°F

## 2023-09-13 DIAGNOSIS — R739 Hyperglycemia, unspecified: Secondary | ICD-10-CM | POA: Diagnosis not present

## 2023-09-13 DIAGNOSIS — E782 Mixed hyperlipidemia: Secondary | ICD-10-CM

## 2023-09-13 DIAGNOSIS — Z0000 Encounter for general adult medical examination without abnormal findings: Secondary | ICD-10-CM | POA: Diagnosis not present

## 2023-09-13 DIAGNOSIS — I10 Essential (primary) hypertension: Secondary | ICD-10-CM | POA: Diagnosis not present

## 2023-09-13 NOTE — Progress Notes (Signed)
 Complete physical exam  Patient: Melinda Ferguson   DOB: 1951/03/03   73 y.o. Female  MRN: [id number]  Subjective:    Chief Complaint  Patient presents with   Results    Patient presents to discuss CT cardiac scoring results    Melinda Ferguson is a 73 y.o. female who presents today for a complete physical exam. She reports consuming a general diet. Home exercise routine includes walking 3 hrs per week. She generally feels well. She reports sleeping well. She does not have additional problems to discuss today.    Most recent fall risk assessment:    06/06/2023    8:25 AM  Fall Risk   Falls in the past year? 0  Number falls in past yr: 0  Injury with Fall? 0  Risk for fall due to : No Fall Risks  Follow up Falls prevention discussed     Most recent depression screenings:    06/06/2023    8:23 AM 08/19/2022    9:29 AM  PHQ 2/9 Scores  PHQ - 2 Score 0 0  PHQ- 9 Score  0    Vision:had cataract surgery recently, vision is doing well, just saw the eye doctor today and Dental: No current dental problems and Receives regular dental care  Patient Active Problem List   Diagnosis Date Noted   Pain in joint of right knee 11/18/2022   Carpal tunnel syndrome of left wrist 06/07/2022   Lumbar spondylosis 04/19/2022   Osteoarthritis of right knee - sees Dr. Lequita Halt 02/19/2014   HTN (hypertension) 01/01/2013   Allergic rhinitis 01/01/2013      Patient Care Team: Karie Georges, MD as PCP - General (Family Medicine) Ria Comment, FNP (Obstetrics and Gynecology) Sharyn Lull, Elvin So, MD as Referring Physician (Dermatology)   Outpatient Medications Prior to Visit  Medication Sig   Azelastine HCl 0.15 % SOLN 2 actuations in each nostril BID   diphenhydramine-acetaminophen (TYLENOL PM) 25-500 MG TABS tablet Take 1 tablet by mouth at bedtime.   fexofenadine (ALLEGRA) 60 MG tablet Take 60 mg by mouth daily.   fluticasone (FLONASE) 50 MCG/ACT nasal spray Place 2 sprays  into both nostrils daily.   gabapentin (NEURONTIN) 100 MG capsule TAKE 1-3 CAPSULES by mouth AT BEDTIME   losartan-hydrochlorothiazide (HYZAAR) 100-12.5 MG tablet Take 1 tablet by mouth daily.   meclizine (ANTIVERT) 12.5 MG tablet Take 1 tablet (12.5 mg total) by mouth 3 (three) times daily as needed for dizziness.   No facility-administered medications prior to visit.    Review of Systems  HENT:  Negative for hearing loss.   Eyes:  Negative for blurred vision.  Respiratory:  Negative for shortness of breath.   Cardiovascular:  Negative for chest pain.  Gastrointestinal: Negative.   Genitourinary: Negative.   Musculoskeletal:  Negative for back pain.  Neurological:  Negative for headaches.  Psychiatric/Behavioral:  Negative for depression.        Objective:     BP 132/70   Pulse 88   Temp 97.7 F (36.5 C) (Oral)   LMP 07/19/1997   SpO2 97%    Physical Exam Vitals reviewed.  Constitutional:      Appearance: Normal appearance. She is well-groomed. She is obese.  HENT:     Right Ear: Tympanic membrane and ear canal normal.     Left Ear: Tympanic membrane and ear canal normal.     Mouth/Throat:     Mouth: Mucous membranes are moist.     Pharynx: No  posterior oropharyngeal erythema.  Eyes:     Conjunctiva/sclera: Conjunctivae normal.  Neck:     Thyroid: No thyromegaly.  Cardiovascular:     Rate and Rhythm: Normal rate and regular rhythm.     Pulses: Normal pulses.     Heart sounds: S1 normal and S2 normal.  Pulmonary:     Effort: Pulmonary effort is normal.     Breath sounds: Normal breath sounds and air entry.  Abdominal:     General: Bowel sounds are normal.     Palpations: Abdomen is soft.  Musculoskeletal:     Right lower leg: Edema (1+ BL ankles) present.     Left lower leg: Edema present.  Lymphadenopathy:     Cervical: No cervical adenopathy.  Neurological:     Mental Status: She is alert and oriented to person, place, and time. Mental status is at  baseline.     Gait: Gait is intact.  Psychiatric:        Mood and Affect: Mood and affect normal.        Speech: Speech normal.        Behavior: Behavior normal.        Judgment: Judgment normal.      No results found for any visits on 09/13/23.     Assessment & Plan:    Routine Health Maintenance and Physical Exam  Immunization History  Administered Date(s) Administered   Fluad Trivalent(High Dose 65+) 05/10/2023   Influenza, High Dose Seasonal PF 05/05/2018, 04/18/2019   Influenza-Unspecified 04/18/2014, 04/23/2020   Moderna Covid-19 Vaccine Bivalent Booster 9yrs & up 05/25/2021   Moderna Sars-Covid-2 Vaccination 08/20/2019, 09/21/2019, 05/14/2020   PNEUMOCOCCAL CONJUGATE-20 07/27/2021   Pneumococcal Conjugate-13 10/29/2015   Pneumococcal Polysaccharide-23 01/18/2017   Tdap 01/04/2012, 05/19/2021   Zoster Recombinant(Shingrix) 10/01/2016, 01/25/2017   Zoster, Live 01/04/2012    Health Maintenance  Topic Date Due   COVID-19 Vaccine (5 - 2024-25 season) 03/20/2023   Medicare Annual Wellness (AWV)  06/05/2024   MAMMOGRAM  08/03/2025   Colonoscopy  01/24/2028   DTaP/Tdap/Td (3 - Td or Tdap) 05/20/2031   Pneumonia Vaccine 89+ Years old  Completed   INFLUENZA VACCINE  Completed   DEXA SCAN  Completed   Hepatitis C Screening  Completed   Zoster Vaccines- Shingrix  Completed   HPV VACCINES  Aged Out    Discussed health benefits of physical activity, and encouraged her to engage in regular exercise appropriate for her age and condition.  Primary hypertension -     Comprehensive metabolic panel; Future  Mixed hyperlipidemia -     Lipid panel; Future  Hyperglycemia -     Hemoglobin A1c; Future  Routine adult health maintenance -     CBC with Differential/Platelet; Future  Physical exam findings are stable from previous, she does have mild ankle edema but this appears to be chronic. She is due for her annual labs. I reviewed her health maintenance, she is UTD on  her vaccinations and her preventative testing. Counseled patient on cardiac risk and the results of her CT calcium score she had recently. Pt gets muscle pain with statins. Handouts given on healthy eating and exercise.   Return in about 6 months (around 03/12/2024) for HTN.     Karie Georges, MD

## 2023-09-28 ENCOUNTER — Encounter: Payer: Self-pay | Admitting: Family Medicine

## 2023-09-28 ENCOUNTER — Other Ambulatory Visit (INDEPENDENT_AMBULATORY_CARE_PROVIDER_SITE_OTHER): Payer: Medicare PPO

## 2023-09-28 DIAGNOSIS — I10 Essential (primary) hypertension: Secondary | ICD-10-CM | POA: Diagnosis not present

## 2023-09-28 DIAGNOSIS — R739 Hyperglycemia, unspecified: Secondary | ICD-10-CM | POA: Diagnosis not present

## 2023-09-28 DIAGNOSIS — Z0000 Encounter for general adult medical examination without abnormal findings: Secondary | ICD-10-CM | POA: Diagnosis not present

## 2023-09-28 DIAGNOSIS — E782 Mixed hyperlipidemia: Secondary | ICD-10-CM

## 2023-09-28 LAB — CBC WITH DIFFERENTIAL/PLATELET
Basophils Absolute: 0 10*3/uL (ref 0.0–0.1)
Basophils Relative: 1 % (ref 0.0–3.0)
Eosinophils Absolute: 0.1 10*3/uL (ref 0.0–0.7)
Eosinophils Relative: 2.8 % (ref 0.0–5.0)
HCT: 40.4 % (ref 36.0–46.0)
Hemoglobin: 13.8 g/dL (ref 12.0–15.0)
Lymphocytes Relative: 44.5 % (ref 12.0–46.0)
Lymphs Abs: 2.1 10*3/uL (ref 0.7–4.0)
MCHC: 34.1 g/dL (ref 30.0–36.0)
MCV: 93.1 fl (ref 78.0–100.0)
Monocytes Absolute: 0.6 10*3/uL (ref 0.1–1.0)
Monocytes Relative: 12.3 % — ABNORMAL HIGH (ref 3.0–12.0)
Neutro Abs: 1.8 10*3/uL (ref 1.4–7.7)
Neutrophils Relative %: 39.4 % — ABNORMAL LOW (ref 43.0–77.0)
Platelets: 342 10*3/uL (ref 150.0–400.0)
RBC: 4.33 Mil/uL (ref 3.87–5.11)
RDW: 13.1 % (ref 11.5–15.5)
WBC: 4.7 10*3/uL (ref 4.0–10.5)

## 2023-09-28 LAB — COMPREHENSIVE METABOLIC PANEL
ALT: 13 U/L (ref 0–35)
AST: 17 U/L (ref 0–37)
Albumin: 3.8 g/dL (ref 3.5–5.2)
Alkaline Phosphatase: 79 U/L (ref 39–117)
BUN: 15 mg/dL (ref 6–23)
CO2: 26 meq/L (ref 19–32)
Calcium: 9.1 mg/dL (ref 8.4–10.5)
Chloride: 104 meq/L (ref 96–112)
Creatinine, Ser: 0.84 mg/dL (ref 0.40–1.20)
GFR: 68.56 mL/min (ref >60.00)
Glucose, Bld: 91 mg/dL (ref 70–99)
Potassium: 3.7 meq/L (ref 3.5–5.1)
Sodium: 138 meq/L (ref 135–145)
Total Bilirubin: 0.6 mg/dL (ref 0.2–1.2)
Total Protein: 6.6 g/dL (ref 6.0–8.3)

## 2023-09-28 LAB — HEMOGLOBIN A1C: Hgb A1c MFr Bld: 5.9 % (ref 4.6–6.5)

## 2023-09-28 LAB — LIPID PANEL
Cholesterol: 228 mg/dL — ABNORMAL HIGH (ref 0–200)
HDL: 66 mg/dL (ref >39.00)
LDL Cholesterol: 147 mg/dL — ABNORMAL HIGH (ref 0–99)
NonHDL: 162.03
Total CHOL/HDL Ratio: 3
Triglycerides: 76 mg/dL (ref 0.0–149.0)
VLDL: 15.2 mg/dL (ref 0.0–40.0)

## 2023-10-11 DIAGNOSIS — Z85828 Personal history of other malignant neoplasm of skin: Secondary | ICD-10-CM | POA: Diagnosis not present

## 2023-10-11 DIAGNOSIS — L820 Inflamed seborrheic keratosis: Secondary | ICD-10-CM | POA: Diagnosis not present

## 2023-10-11 DIAGNOSIS — D225 Melanocytic nevi of trunk: Secondary | ICD-10-CM | POA: Diagnosis not present

## 2023-10-11 DIAGNOSIS — L570 Actinic keratosis: Secondary | ICD-10-CM | POA: Diagnosis not present

## 2023-10-11 DIAGNOSIS — L814 Other melanin hyperpigmentation: Secondary | ICD-10-CM | POA: Diagnosis not present

## 2023-10-11 DIAGNOSIS — L578 Other skin changes due to chronic exposure to nonionizing radiation: Secondary | ICD-10-CM | POA: Diagnosis not present

## 2023-10-11 DIAGNOSIS — L821 Other seborrheic keratosis: Secondary | ICD-10-CM | POA: Diagnosis not present

## 2023-10-20 ENCOUNTER — Encounter: Payer: Self-pay | Admitting: Family Medicine

## 2023-11-24 DIAGNOSIS — H04212 Epiphora due to excess lacrimation, left lacrimal gland: Secondary | ICD-10-CM | POA: Diagnosis not present

## 2023-11-24 DIAGNOSIS — H10412 Chronic giant papillary conjunctivitis, left eye: Secondary | ICD-10-CM | POA: Diagnosis not present

## 2023-11-30 DIAGNOSIS — H1031 Unspecified acute conjunctivitis, right eye: Secondary | ICD-10-CM | POA: Diagnosis not present

## 2023-12-14 ENCOUNTER — Other Ambulatory Visit: Payer: Self-pay | Admitting: Physical Medicine and Rehabilitation

## 2023-12-14 ENCOUNTER — Encounter: Payer: Self-pay | Admitting: Physical Medicine and Rehabilitation

## 2023-12-14 DIAGNOSIS — M5412 Radiculopathy, cervical region: Secondary | ICD-10-CM

## 2023-12-19 DIAGNOSIS — M65341 Trigger finger, right ring finger: Secondary | ICD-10-CM | POA: Diagnosis not present

## 2023-12-19 DIAGNOSIS — M65331 Trigger finger, right middle finger: Secondary | ICD-10-CM | POA: Diagnosis not present

## 2023-12-23 DIAGNOSIS — M65341 Trigger finger, right ring finger: Secondary | ICD-10-CM | POA: Diagnosis not present

## 2023-12-23 DIAGNOSIS — M65331 Trigger finger, right middle finger: Secondary | ICD-10-CM | POA: Diagnosis not present

## 2024-01-09 ENCOUNTER — Ambulatory Visit: Admitting: Physical Medicine and Rehabilitation

## 2024-01-09 ENCOUNTER — Other Ambulatory Visit: Payer: Self-pay

## 2024-01-09 VITALS — BP 163/98 | HR 76

## 2024-01-09 DIAGNOSIS — M5412 Radiculopathy, cervical region: Secondary | ICD-10-CM | POA: Diagnosis not present

## 2024-01-09 MED ORDER — METHYLPREDNISOLONE ACETATE 40 MG/ML IJ SUSP
40.0000 mg | Freq: Once | INTRAMUSCULAR | Status: AC
Start: 2024-01-09 — End: 2024-01-09
  Administered 2024-01-09: 40 mg

## 2024-01-09 NOTE — Progress Notes (Signed)
 Melinda Ferguson - 73 y.o. female MRN [id number]  Date of birth: [birth year]  Office Visit Note: Visit Date: 01/09/2024 PCP: Ozell Heron HERO, MD Referred by: Ozell Heron HERO, MD  Subjective: Chief Complaint  Patient presents with   Neck - Pain   HPI:  Melinda Ferguson is a 73 y.o. female who comes in today for planned repeat Left C7-T1  Cervical Interlaminar epidural steroid injection with fluoroscopic guidance.  The patient has failed conservative care including home exercise, medications, time and activity modification.  This injection will be diagnostic and hopefully therapeutic.  Please see requesting physician notes for further details and justification. Patient received more than 50% pain relief from prior injection.   Referring: Duwaine Pouch, FNP   ROS Otherwise per HPI.  Assessment & Plan: Visit Diagnoses:    ICD-10-CM   1. Cervical radiculopathy  M54.12 XR C-ARM NO REPORT    Epidural Steroid injection    methylPREDNISolone  acetate (DEPO-MEDROL ) injection 40 mg      Plan: No additional findings.   Meds & Orders:  Meds ordered this encounter  Medications   methylPREDNISolone  acetate (DEPO-MEDROL ) injection 40 mg    Orders Placed This Encounter  Procedures   XR C-ARM NO REPORT   Epidural Steroid injection    Follow-up: Return if symptoms worsen or fail to improve.   Procedures: No procedures performed      Clinical History: Narrative & Impression CLINICAL DATA:  Low back pain for 5 years.   EXAM: MRI LUMBAR SPINE WITHOUT CONTRAST   TECHNIQUE: Multiplanar, multisequence MR imaging of the lumbar spine was performed. No intravenous contrast was administered.   COMPARISON:  Lumbar spine radiographs 07/27/2021   FINDINGS: Segmentation: There are five lumbar type vertebral bodies. The last full intervertebral disc space is labeled L5-S1. This correlates with the radiographs.   Alignment: Degenerative lumbar spondylosis with mild  multilevel degenerative listhesis but normal overall alignment.   Vertebrae:  Normal marrow signal.  No bone lesions or fractures.   Conus medullaris and cauda equina: Conus extends to the L1-2 level. Conus and cauda equina appear normal.   Paraspinal and other soft tissues: Small simple parenchymal and parapelvic renal cysts not requiring any further imaging evaluation or follow-up.   Disc levels:   T12-L1: Mild bulging annulus and mild facet disease with mild bilateral lateral recess encroachment but no spinal foraminal stenosis.   L1-2: Degenerative retrolisthesis of L1 compared L2. Degenerative disc disease with a bulging annulus and osteophytic ridging. There is also mild facet disease and ligamentum flavum thickening but no significant spinal stenosis. Mild bilateral lateral recess stenosis. No significant foraminal stenosis.   L2-3: Bulging annulus, osteophytic ridging and facet disease contributing to early spinal stenosis and mild bilateral lateral recess stenosis. No significant foraminal stenosis.   L3-4: Bulging degenerated annulus, moderate facet disease and ligamentum flavum thickening contributing to mild/early spinal stenosis, mild right lateral recess stenosis and moderate left lateral recess stenosis. No significant foraminal stenosis. Mild left foraminal encroachment.   L4-5: Bulging annulus, osteophytic ridging, facet disease and ligamentum flavum thickening but no significant spinal stenosis. Moderate bilateral lateral recess stenosis, left greater than right and mild bilateral foraminal stenosis, left greater than right.   L5-S1: Advanced facet disease and bulging degenerated annulus but no significant spinal or lateral recess stenosis. Mild right foraminal stenosis.   IMPRESSION: 1. Degenerative lumbar spondylosis with multilevel disc disease and facet disease. 2. Mild bilateral lateral recess stenosis at L1-2. 3. Early spinal stenosis and mild  bilateral lateral recess stenosis at L2-3. 4. Mild/early spinal stenosis, mild right lateral recess stenosis and moderate left lateral recess stenosis at L3-4. Mild left foraminal encroachment. 5. Moderate bilateral lateral recess stenosis, left greater than right and mild bilateral foraminal stenosis, left greater than right at L4-5. 6. Mild right foraminal stenosis at L5-S1.     Electronically Signed   By: MYRTIS Stammer M.D.   On: 09/12/2022 10:34     Objective:  VS:  HT:    WT:   BMI:     BP:(!) 163/98  HR:76bpm  TEMP: ( )  RESP:  Physical Exam Vitals and nursing note reviewed.  Constitutional:      General: She is not in acute distress.    Appearance: Normal appearance. She is not ill-appearing.  HENT:     Head: Normocephalic and atraumatic.     Right Ear: External ear normal.     Left Ear: External ear normal.   Eyes:     Extraocular Movements: Extraocular movements intact.    Cardiovascular:     Rate and Rhythm: Normal rate.     Pulses: Normal pulses.   Musculoskeletal:     Cervical back: Tenderness present. No rigidity.     Right lower leg: No edema.     Left lower leg: No edema.     Comments: Patient has good strength in the upper extremities including 5 out of 5 strength in wrist extension long finger flexion and APB.  There is no atrophy of the hands intrinsically.  There is a negative Hoffmann's test.   Lymphadenopathy:     Cervical: No cervical adenopathy.   Skin:    Findings: No erythema, lesion or rash.   Neurological:     General: No focal deficit present.     Mental Status: She is alert and oriented to person, place, and time.     Sensory: No sensory deficit.     Motor: No weakness or abnormal muscle tone.     Coordination: Coordination normal.   Psychiatric:        Mood and Affect: Mood normal.        Behavior: Behavior normal.      Imaging: No results found.

## 2024-01-09 NOTE — Patient Instructions (Signed)

## 2024-01-09 NOTE — Progress Notes (Signed)
 Pain Scale   Average Pain 0 Patient advising she has chronic neck radiating to shoulders, however today the pain is minimal. Patient advising the pain comes and goes.         +Driver, -BT, -Dye Allergies.

## 2024-01-12 DIAGNOSIS — M25641 Stiffness of right hand, not elsewhere classified: Secondary | ICD-10-CM | POA: Diagnosis not present

## 2024-01-19 DIAGNOSIS — M25641 Stiffness of right hand, not elsewhere classified: Secondary | ICD-10-CM | POA: Diagnosis not present

## 2024-02-01 DIAGNOSIS — M25641 Stiffness of right hand, not elsewhere classified: Secondary | ICD-10-CM | POA: Diagnosis not present

## 2024-02-13 DIAGNOSIS — M65342 Trigger finger, left ring finger: Secondary | ICD-10-CM | POA: Diagnosis not present

## 2024-02-16 DIAGNOSIS — M25641 Stiffness of right hand, not elsewhere classified: Secondary | ICD-10-CM | POA: Diagnosis not present

## 2024-03-12 ENCOUNTER — Ambulatory Visit: Payer: Medicare PPO | Admitting: Family Medicine

## 2024-03-13 ENCOUNTER — Ambulatory Visit: Admitting: Family Medicine

## 2024-03-14 ENCOUNTER — Ambulatory Visit: Admitting: Family Medicine

## 2024-03-20 ENCOUNTER — Ambulatory Visit: Admitting: Family Medicine

## 2024-03-30 ENCOUNTER — Encounter: Payer: Self-pay | Admitting: Family Medicine

## 2024-04-08 ENCOUNTER — Encounter: Payer: Self-pay | Admitting: Internal Medicine

## 2024-04-09 ENCOUNTER — Ambulatory Visit: Admitting: Internal Medicine

## 2024-04-30 ENCOUNTER — Ambulatory Visit: Admitting: Family Medicine

## 2024-05-21 ENCOUNTER — Encounter: Payer: Self-pay | Admitting: Radiology

## 2024-06-07 DIAGNOSIS — H04123 Dry eye syndrome of bilateral lacrimal glands: Secondary | ICD-10-CM | POA: Diagnosis not present

## 2024-06-07 DIAGNOSIS — H43813 Vitreous degeneration, bilateral: Secondary | ICD-10-CM | POA: Diagnosis not present

## 2024-06-07 DIAGNOSIS — H524 Presbyopia: Secondary | ICD-10-CM | POA: Diagnosis not present

## 2024-06-07 DIAGNOSIS — Z961 Presence of intraocular lens: Secondary | ICD-10-CM | POA: Diagnosis not present

## 2024-06-08 ENCOUNTER — Ambulatory Visit (INDEPENDENT_AMBULATORY_CARE_PROVIDER_SITE_OTHER)

## 2024-06-08 VITALS — Ht 64.0 in | Wt 185.0 lb

## 2024-06-08 DIAGNOSIS — Z0000 Encounter for general adult medical examination without abnormal findings: Secondary | ICD-10-CM

## 2024-06-08 NOTE — Progress Notes (Signed)
 Chief Complaint  Patient presents with   Medicare Wellness     Subjective:   Melinda Ferguson is a 73 y.o. female who presents for a Medicare Annual Wellness Visit.  Allergies (verified) Patient has no known allergies.   History: Past Medical History:  Diagnosis Date   Allergy    Arthritis    Colon polyps    Hypertension    Past Surgical History:  Procedure Laterality Date   COLONOSCOPY     orthoscopy Left 1994   KNEE   TONSILLECTOMY     wisdom teeth extraction  1970   Family History  Problem Relation Age of Onset   Alzheimer's disease Mother 11       passed at 57   Diverticulosis Mother    Heart disease Father 47       CAD, bypass   Migraines Sister    Colon cancer Maternal Grandfather 26   Esophageal cancer Neg Hx    Stomach cancer Neg Hx    Rectal cancer Neg Hx    Breast cancer Neg Hx    Social History   Occupational History   Occupation: transport planner    Comment: retired  Tobacco Use   Smoking status: Former    Current packs/day: 0.00    Types: Cigarettes    Quit date: 01/04/1979    Years since quitting: 45.4   Smokeless tobacco: Never  Vaping Use   Vaping status: Never Used  Substance and Sexual Activity   Alcohol use: Yes    Alcohol/week: 7.0 standard drinks of alcohol    Types: 7 Glasses of wine per week   Drug use: No   Sexual activity: Yes   Tobacco Counseling Counseling given: No  SDOH Screenings   Food Insecurity: No Food Insecurity (06/08/2024)  Housing: Low Risk  (06/08/2024)  Transportation Needs: No Transportation Needs (06/08/2024)  Utilities: Not At Risk (06/08/2024)  Alcohol Screen: Low Risk  (06/04/2024)  Depression (PHQ2-9): Low Risk  (06/08/2024)  Financial Resource Strain: Low Risk  (06/04/2024)  Physical Activity: Insufficiently Active (06/08/2024)  Social Connections: Moderately Integrated (06/08/2024)  Stress: No Stress Concern Present (06/08/2024)  Tobacco Use: Medium Risk (06/08/2024)  Health Literacy:  Adequate Health Literacy (06/08/2024)   See flowsheets for full screening details  Depression Screen PHQ 2 & 9 Depression Scale- Over the past 2 weeks, how often have you been bothered by any of the following problems? Little interest or pleasure in doing things: 0 Feeling down, depressed, or hopeless (PHQ Adolescent also includes...irritable): 0 PHQ-2 Total Score: 0     Goals Addressed               This Visit's Progress     Continue physical activity (pt-stated)        Lose weight.       Visit info / Clinical Intake: Medicare Wellness Visit Type:: Subsequent Annual Wellness Visit Persons participating in visit:: patient Medicare Wellness Visit Mode:: Telephone If telephone:: video declined Because this visit was a virtual/telehealth visit:: pt reported vitals If Telephone or Video please confirm:: I connected with the patient using audio enabled telemedicine application and verified that I am speaking with the correct person using two identifiers Patient Location:: Home Provider Location:: Office Information given by:: patient Interpreter Needed?: No Pre-visit prep was completed: yes AWV questionnaire completed by patient prior to visit?: yes Date:: 06/04/24 Living arrangements:: lives with spouse/significant other Patient's Overall Health Status Rating: very good Typical amount of pain: none Does pain affect daily  life?: no Are you currently prescribed opioids?: no  Dietary Habits and Nutritional Risks How many meals a day?: 3 Eats fruit and vegetables daily?: yes Most meals are obtained by: preparing own meals In the last 2 weeks, have you had any of the following?: none Diabetic:: no  Functional Status Activities of Daily Living (to include ambulation/medication): Independent Ambulation: Independent with device- listed below Home Assistive Devices/Equipment: Contact lenses Medication Administration: Independent Home Management: Independent Manage your  own finances?: yes Primary transportation is: driving Concerns about vision?: no *vision screening is required for WTM* Concerns about hearing?: no  Fall Screening Falls in the past year?: 0 Number of falls in past year: 0 Was there an injury with Fall?: 0 Fall Risk Category Calculator: 0 Patient Fall Risk Level: Low Fall Risk  Fall Risk Patient at Risk for Falls Due to: No Fall Risks  Home and Transportation Safety: All rugs have non-skid backing?: yes All stairs or steps have railings?: yes Grab bars in the bathtub or shower?: yes Have non-skid surface in bathtub or shower?: yes Good home lighting?: yes Regular seat belt use?: yes Hospital stays in the last year:: no  Cognitive Assessment Difficulty concentrating, remembering, or making decisions? : no Will 6CIT or Mini Cog be Completed: no 6CIT or Mini Cog Declined: patient alert, oriented, able to answer questions appropriately and recall recent events  Advance Directives (For Healthcare) Does Patient Have a Medical Advance Directive?: Yes Does patient want to make changes to medical advance directive?: No - Patient declined Type of Advance Directive: Healthcare Power of [place]; Living will Copy of Healthcare Power of Attorney in Chart?: Yes - validated most recent copy scanned in chart (See row information) Copy of Living Will in Chart?: Yes - validated most recent copy scanned in chart (See row information)  Reviewed/Updated  Reviewed/Updated: Reviewed All (Medical, Surgical, Family, Medications, Allergies, Care Teams, Patient Goals)        Objective:    Today's Vitals   06/08/24 0844  Weight: 185 lb (83.9 kg)  Height: 5' 4 (1.626 m)   Body mass index is 31.76 kg/m.  Current Medications (verified) Outpatient Encounter Medications as of 06/08/2024  Medication Sig   Azelastine  HCl 0.15 % SOLN 2 actuations in each nostril BID   diphenhydramine-acetaminophen (TYLENOL PM) 25-500 MG TABS tablet Take 1 tablet  by mouth at bedtime.   fexofenadine (ALLEGRA) 60 MG tablet Take 60 mg by mouth daily.   fluticasone  (FLONASE ) 50 MCG/ACT nasal spray Place 2 sprays into both nostrils daily.   gabapentin  (NEURONTIN ) 100 MG capsule TAKE 1-3 CAPSULES by mouth AT BEDTIME   losartan -hydrochlorothiazide (HYZAAR) 100-12.5 MG tablet Take 1 tablet by mouth daily.   meclizine  (ANTIVERT ) 12.5 MG tablet Take 1 tablet (12.5 mg total) by mouth 3 (three) times daily as needed for dizziness.   No facility-administered encounter medications on file as of 06/08/2024.   Hearing/Vision screen Hearing Screening - Comments:: Denies hearing difficulties   Vision Screening - Comments:: Wears rx glasses - up to date with routine eye exams with  Dr Waylan Immunizations and Health Maintenance Health Maintenance  Topic Date Due   COVID-19 Vaccine (6 - Moderna risk 2025-26 season) 11/16/2024   Medicare Annual Wellness (AWV)  06/08/2025   Mammogram  08/03/2025   Colonoscopy  01/24/2028   DTaP/Tdap/Td (3 - Td or Tdap) 05/20/2031   Pneumococcal Vaccine: 50+ Years  Completed   Influenza Vaccine  Completed   Bone Density Scan  Completed   Hepatitis C Screening  Completed   Zoster Vaccines- Shingrix  Completed   Meningococcal B Vaccine  Aged Out        Assessment/Plan:  This is a routine wellness examination for [place].  Patient Care Team: Ozell Heron HERO, MD as PCP - General (Family Medicine) Nada Macintosh, FNP (Obstetrics and Gynecology) Tricia, Tawni CROME, MD as Referring Physician (Dermatology)  I have personally reviewed and noted the following in the patient's chart:   Medical and social history Use of alcohol, tobacco or illicit drugs  Current medications and supplements including opioid prescriptions. Functional ability and status Nutritional status Physical activity Advanced directives List of other physicians Hospitalizations, surgeries, and ER visits in previous 12 months Vitals Screenings to  include cognitive, depression, and falls Referrals and appointments  No orders of the defined types were placed in this encounter.  In addition, I have reviewed and discussed with patient certain preventive protocols, quality metrics, and best practice recommendations. A written personalized care plan for preventive services as well as general preventive health recommendations were provided to patient.   Rojelio LELON Blush, LPN   [id number]/7974    Return in 1 year on 06/14/25  After Visit Summary: (MyChart) Due to this being a telephonic visit, the after visit summary with patients personalized plan was offered to patient via MyChart   Nurse Notes: None

## 2024-06-08 NOTE — Patient Instructions (Addendum)
 Melinda Ferguson,  Thank you for taking the time for your Medicare Wellness Visit. I appreciate your continued commitment to your health goals. Please review the care plan we discussed, and feel free to reach out if I can assist you further.  Please note that Annual Wellness Visits do not include a physical exam. Some assessments may be limited, especially if the visit was conducted virtually. If needed, we may recommend an in-person follow-up with your provider.  Ongoing Care Seeing your primary care provider every 3 to 6 months helps us  monitor your health and provide consistent, personalized care.    Referrals If a referral was made during today's visit and you haven't received any updates within two weeks, please contact the referred provider directly to check on the status.  Recommended Screenings:  Health Maintenance  Topic Date Due   COVID-19 Vaccine (6 - Moderna risk 2025-26 season) 11/16/2024   Medicare Annual Wellness Visit  06/08/2025   Breast Cancer Screening  08/03/2025   Colon Cancer Screening  01/24/2028   DTaP/Tdap/Td vaccine (3 - Td or Tdap) 05/20/2031   Pneumococcal Vaccine for age over 14  Completed   Flu Shot  Completed   Osteoporosis screening with Bone Density Scan  Completed   Hepatitis C Screening  Completed   Zoster (Shingles) Vaccine  Completed   Meningitis B Vaccine  Aged Out       06/08/2024    8:45 AM  Advanced Directives  Does Patient Have a Medical Advance Directive? Yes  Type of Estate Agent of [place];Living will  Does patient want to make changes to medical advance directive? No - Patient declined  Copy of Healthcare Power of Attorney in Chart? Yes - validated most recent copy scanned in chart (See row information)    Vision: Annual vision screenings are recommended for early detection of glaucoma, cataracts, and diabetic retinopathy. These exams can also reveal signs of chronic conditions such as diabetes and high blood  pressure.  Dental: Annual dental screenings help detect early signs of oral cancer, gum disease, and other conditions linked to overall health, including heart disease and diabetes.  Please see the attached documents for additional preventive care recommendations.

## 2024-07-01 ENCOUNTER — Other Ambulatory Visit: Payer: Self-pay | Admitting: Family Medicine

## 2024-07-01 DIAGNOSIS — I10 Essential (primary) hypertension: Secondary | ICD-10-CM

## 2024-07-05 ENCOUNTER — Other Ambulatory Visit: Payer: Self-pay | Admitting: Family Medicine

## 2024-07-05 DIAGNOSIS — Z1231 Encounter for screening mammogram for malignant neoplasm of breast: Secondary | ICD-10-CM

## 2024-08-07 ENCOUNTER — Ambulatory Visit
Admission: RE | Admit: 2024-08-07 | Discharge: 2024-08-07 | Disposition: A | Source: Ambulatory Visit | Attending: Family Medicine

## 2024-08-07 ENCOUNTER — Encounter: Payer: Self-pay | Admitting: Family Medicine

## 2024-08-07 DIAGNOSIS — Z1231 Encounter for screening mammogram for malignant neoplasm of breast: Secondary | ICD-10-CM

## 2024-08-07 DIAGNOSIS — Z7689 Persons encountering health services in other specified circumstances: Secondary | ICD-10-CM

## 2024-08-07 NOTE — Telephone Encounter (Signed)
 Ok to place referral

## 2024-08-14 ENCOUNTER — Ambulatory Visit: Admitting: Family Medicine

## 2024-08-14 ENCOUNTER — Encounter: Payer: Self-pay | Admitting: Family Medicine

## 2024-08-14 VITALS — BP 140/76 | HR 70 | Temp 97.6°F | Ht 63.75 in | Wt 201.1 lb

## 2024-08-14 DIAGNOSIS — Z6834 Body mass index (BMI) 34.0-34.9, adult: Secondary | ICD-10-CM

## 2024-08-14 DIAGNOSIS — E66811 Obesity, class 1: Secondary | ICD-10-CM

## 2024-08-14 DIAGNOSIS — I10 Essential (primary) hypertension: Secondary | ICD-10-CM | POA: Diagnosis not present

## 2024-08-14 MED ORDER — WEGOVY 0.25 MG/0.5ML ~~LOC~~ SOAJ: 0.2500 mg | SUBCUTANEOUS | 0 refills | Status: DC

## 2024-08-14 NOTE — Progress Notes (Signed)
 "  Established Patient Office Visit  Subjective   Patient ID: Melinda Ferguson, female    DOB: May 21, 1951  Age: 74 y.o. MRN: [id number]  Chief Complaint  Patient presents with   Medicare Wellness    HPI  Discussed the use of AI scribe software for clinical note transcription with the patient, who gave verbal consent to proceed.  History of Present Illness   Melinda Ferguson is a 74 year old female with hypertension who presents for a follow-up on her blood pressure management.  Her home blood pressure was previously around 125-126 mmHg, but she has not checked it recently. Readings are higher in medical settings, which she attributes to nervousness, and were elevated at a recent outside visit. She takes losartan  HCT 100 mg/12.5 mg daily.  She has intermittent muscle spasms in her neck, shoulders, and upper torso. Gabapentin  helped in the past, and she is not taking it now but would like to keep it available if needed.  She is interested in weight loss therapy and wants to discuss options and side effects, particularly diarrhea and nausea.  She has a prior knee replacement and prior vertigo treated with meclizine  without current issues.      Current Outpatient Medications  Medication Instructions   Azelastine  HCl 0.15 % SOLN 2 actuations in each nostril BID   diphenhydramine-acetaminophen (TYLENOL PM) 25-500 MG TABS tablet 1 tablet, Daily at bedtime   fexofenadine (ALLEGRA) 60 mg, Daily   fluticasone  (FLONASE ) 50 MCG/ACT nasal spray 2 sprays, Each Nare, Daily   gabapentin  (NEURONTIN ) 100 MG capsule TAKE 1-3 CAPSULES by mouth AT BEDTIME   losartan -hydrochlorothiazide (HYZAAR) 100-12.5 MG tablet 1 tablet, Oral, Daily   meclizine  (ANTIVERT ) 12.5 mg, Oral, 3 times daily PRN   Wegovy  0.25 mg, Subcutaneous, Weekly    Patient Active Problem List   Diagnosis Date Noted   Pain in joint of right knee 11/18/2022   Carpal tunnel syndrome of left wrist 06/07/2022   Lumbar spondylosis  04/19/2022   Osteoarthritis of right knee - sees Dr. Melodi 02/19/2014   HTN (hypertension) 01/01/2013   Allergic rhinitis 01/01/2013     Review of Systems  All other systems reviewed and are negative.     Objective:     BP (!) 140/76   Pulse 70   Temp 97.6 F (36.4 C) (Oral)   Ht 5' 3.75 (1.619 m)   Wt 201 lb 1.6 oz (91.2 kg)   LMP 07/19/1997   SpO2 98%   BMI 34.79 kg/m    Physical Exam Vitals reviewed.  Constitutional:      Appearance: Normal appearance. She is well-groomed. She is obese.  Cardiovascular:     Rate and Rhythm: Normal rate and regular rhythm.     Pulses: Normal pulses.     Heart sounds: S1 normal and S2 normal.  Pulmonary:     Effort: Pulmonary effort is normal.     Breath sounds: Normal breath sounds and air entry.  Musculoskeletal:     Right lower leg: Edema (chronic lymphedema) present.     Left lower leg: Edema present.  Neurological:     Mental Status: She is alert and oriented to person, place, and time. Mental status is at baseline.     Gait: Gait is intact.  Psychiatric:        Mood and Affect: Mood and affect normal.        Speech: Speech normal.        Behavior: Behavior normal.  No results found for any visits on 08/14/24.    The 10-year ASCVD risk score (Arnett DK, et al., 2019) is: 25%    Assessment & Plan:  Primary hypertension  Class 1 obesity with serious comorbidity and body mass index (BMI) of 34.0 to 34.9 in adult, unspecified obesity type -     Wegovy ; Inject 0.25 mg into the skin once a week.  Dispense: 2 mL; Refill: 0   Assessment and Plan    Primary hypertension Blood pressure is elevated today, possibly due to white coat syndrome. Home readings are typically within normal range (124-126 mmHg systolic). Current medication is losartan  100 mg and hydrochlorothiazide 12.5 mg. No immediate changes to medication are necessary. - Monitor blood pressure at home regularly. - Ensure relaxation and medication  adherence during home readings. - Will recheck blood pressure during the next physical examination.  Class 1 obesity Interested in starting weight loss therapy. Discussed Wegovy  injections as a treatment option. Explained the dosing schedule, potential side effects (nausea, constipation), and the importance of maintaining a healthy diet and hydration. Discussed the cost and insurance coverage limitations. Family is supportive and willing to assist financially. Explained that the injectable form has more established data compared to tablets, and the cost difference is minimal. Discussed the potential for weight loss to improve blood pressure and overall health. - Prescribed Wegovy  injections starting at 0.25 mg weekly, with dose escalation over five months. - Instructed to use GoodRx coupon for cost management. - Advised to maintain a healthy diet and hydration. - Instructed to monitor for side effects and contact if nausea or constipation occurs. - Scheduled follow-up in three months to assess progress and adjust treatment as needed.  General health maintenance Pneumococcal vaccination status is up to date with Prevnar 20 received in 2023. - No further pneumococcal vaccination needed.        No follow-ups on file.    Heron CHRISTELLA Sharper, MD "

## 2024-08-14 NOTE — Patient Instructions (Signed)
 Send me a message end of February to let me know if you would like to go up to the 0.5 mg mg dose for next month

## 2024-08-15 ENCOUNTER — Encounter: Payer: Self-pay | Admitting: Family Medicine

## 2024-08-15 DIAGNOSIS — E66811 Obesity, class 1: Secondary | ICD-10-CM

## 2024-08-16 ENCOUNTER — Ambulatory Visit: Payer: Self-pay | Admitting: Family Medicine

## 2024-08-17 MED ORDER — WEGOVY 0.25 MG/0.5ML ~~LOC~~ SOAJ: 0.2500 mg | SUBCUTANEOUS | 0 refills | Status: AC

## 2024-11-12 ENCOUNTER — Encounter: Admitting: Family Medicine

## 2025-06-14 ENCOUNTER — Ambulatory Visit
